# Patient Record
Sex: Male | Born: 1960 | Race: White | Hispanic: No | Marital: Single | Smoking: Former smoker
Health system: Southern US, Community
[De-identification: ages and names within clinical notes are randomized; demographics above are authoritative.]

## PROBLEM LIST (undated history)

## (undated) DIAGNOSIS — F419 Anxiety disorder, unspecified: Secondary | ICD-10-CM

## (undated) DIAGNOSIS — F329 Major depressive disorder, single episode, unspecified: Secondary | ICD-10-CM

## (undated) DIAGNOSIS — D689 Coagulation defect, unspecified: Secondary | ICD-10-CM

## (undated) DIAGNOSIS — I82409 Acute embolism and thrombosis of unspecified deep veins of unspecified lower extremity: Secondary | ICD-10-CM

## (undated) DIAGNOSIS — T7840XA Allergy, unspecified, initial encounter: Secondary | ICD-10-CM

## (undated) DIAGNOSIS — F32A Depression, unspecified: Secondary | ICD-10-CM

## (undated) HISTORY — DX: Anxiety disorder, unspecified: F41.9

## (undated) HISTORY — DX: Depression, unspecified: F32.A

## (undated) HISTORY — DX: Allergy, unspecified, initial encounter: T78.40XA

## (undated) HISTORY — DX: Major depressive disorder, single episode, unspecified: F32.9

## (undated) HISTORY — PX: OTHER SURGICAL HISTORY: SHX169

## (undated) HISTORY — DX: Coagulation defect, unspecified: D68.9

---

## 1998-04-13 ENCOUNTER — Emergency Department (HOSPITAL_COMMUNITY): Admission: EM | Admit: 1998-04-13 | Discharge: 1998-04-13 | Payer: Self-pay | Admitting: Internal Medicine

## 1998-08-20 ENCOUNTER — Emergency Department (HOSPITAL_COMMUNITY): Admission: EM | Admit: 1998-08-20 | Discharge: 1998-08-20 | Payer: Self-pay

## 2000-08-17 ENCOUNTER — Encounter: Payer: Self-pay | Admitting: Emergency Medicine

## 2000-08-17 ENCOUNTER — Encounter: Admission: RE | Admit: 2000-08-17 | Discharge: 2000-08-17 | Payer: Self-pay | Admitting: Emergency Medicine

## 2002-06-26 ENCOUNTER — Encounter: Admission: RE | Admit: 2002-06-26 | Discharge: 2002-06-26 | Payer: Self-pay | Admitting: Internal Medicine

## 2002-06-26 ENCOUNTER — Encounter: Payer: Self-pay | Admitting: Internal Medicine

## 2002-08-31 ENCOUNTER — Emergency Department (HOSPITAL_COMMUNITY): Admission: EM | Admit: 2002-08-31 | Discharge: 2002-09-01 | Payer: Self-pay

## 2003-07-26 ENCOUNTER — Emergency Department (HOSPITAL_COMMUNITY): Admission: EM | Admit: 2003-07-26 | Discharge: 2003-07-26 | Payer: Self-pay | Admitting: Emergency Medicine

## 2003-07-26 IMAGING — CR Imaging study
2 series · 2 of 2 positions shown · non-contrast
Comparison: none

CLINICAL DATA: 43 year old male, chest pain and history of smoking.
 TWO VIEW CHEST RADIOGRAPH

[view not recorded (1 of 2)]
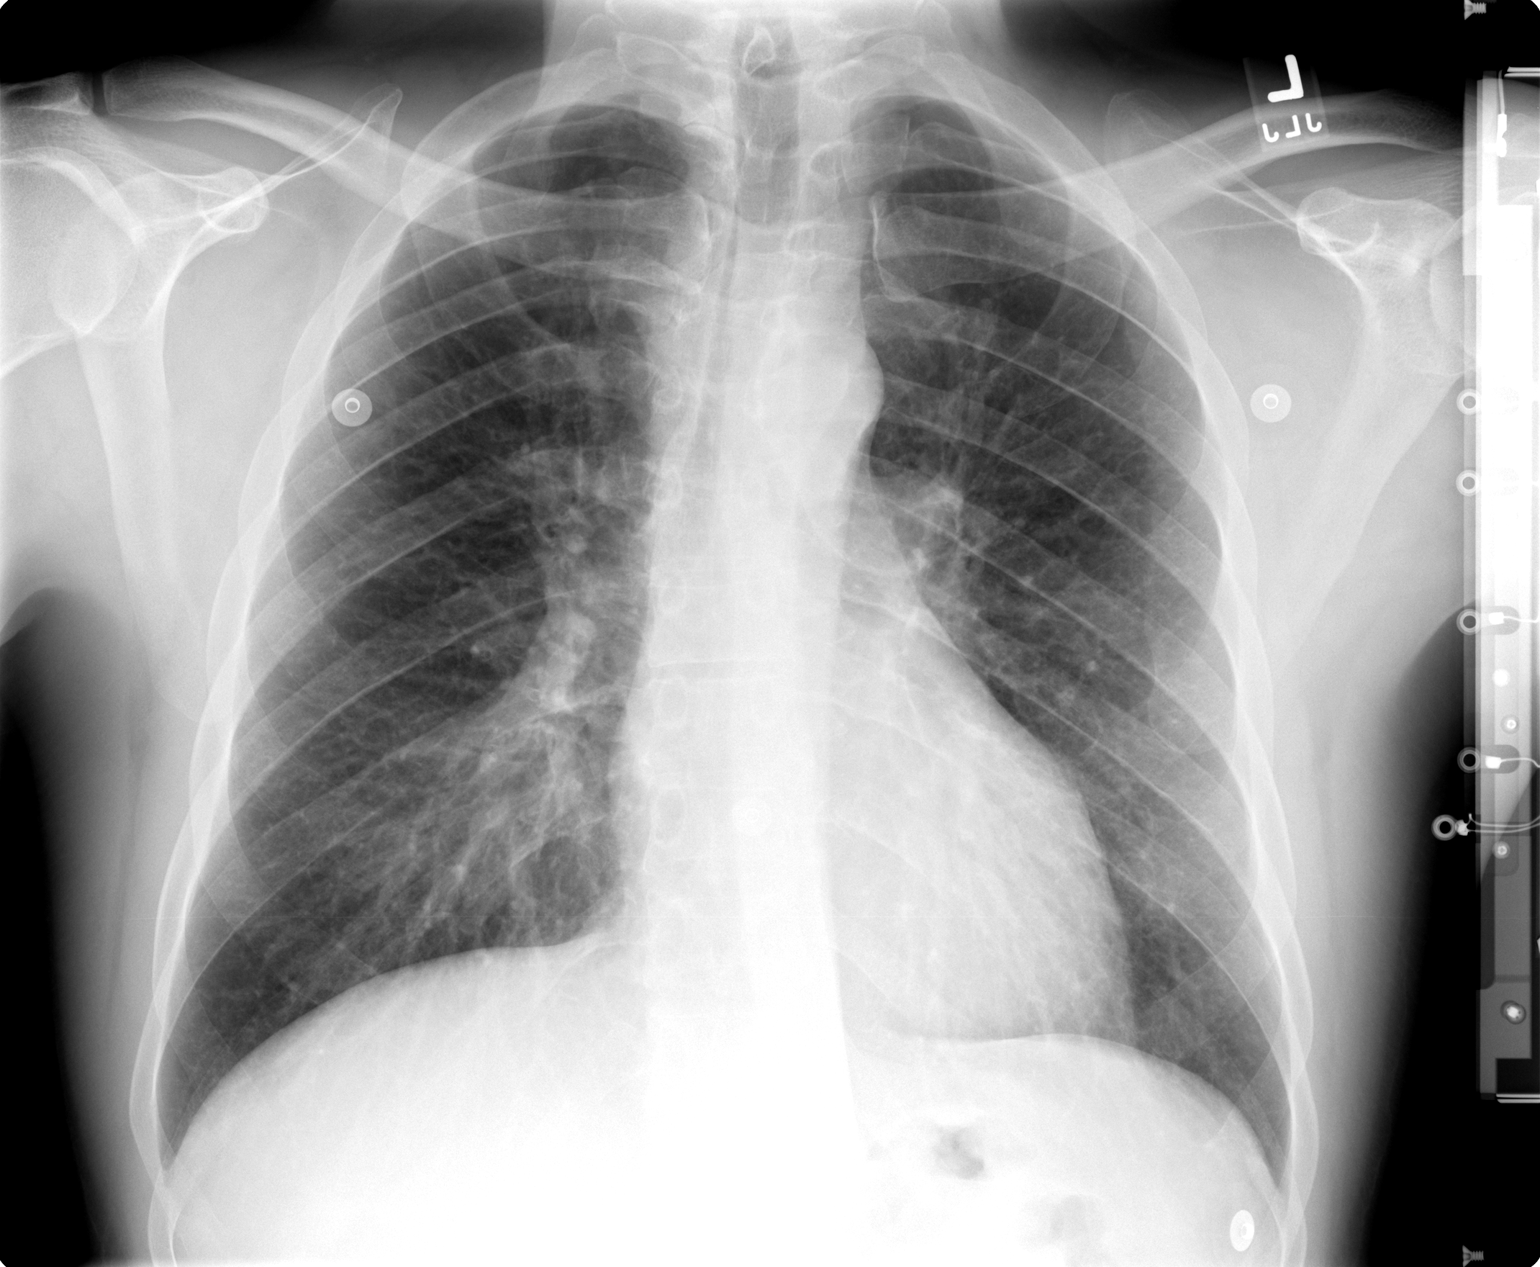

[view not recorded (2 of 2)]
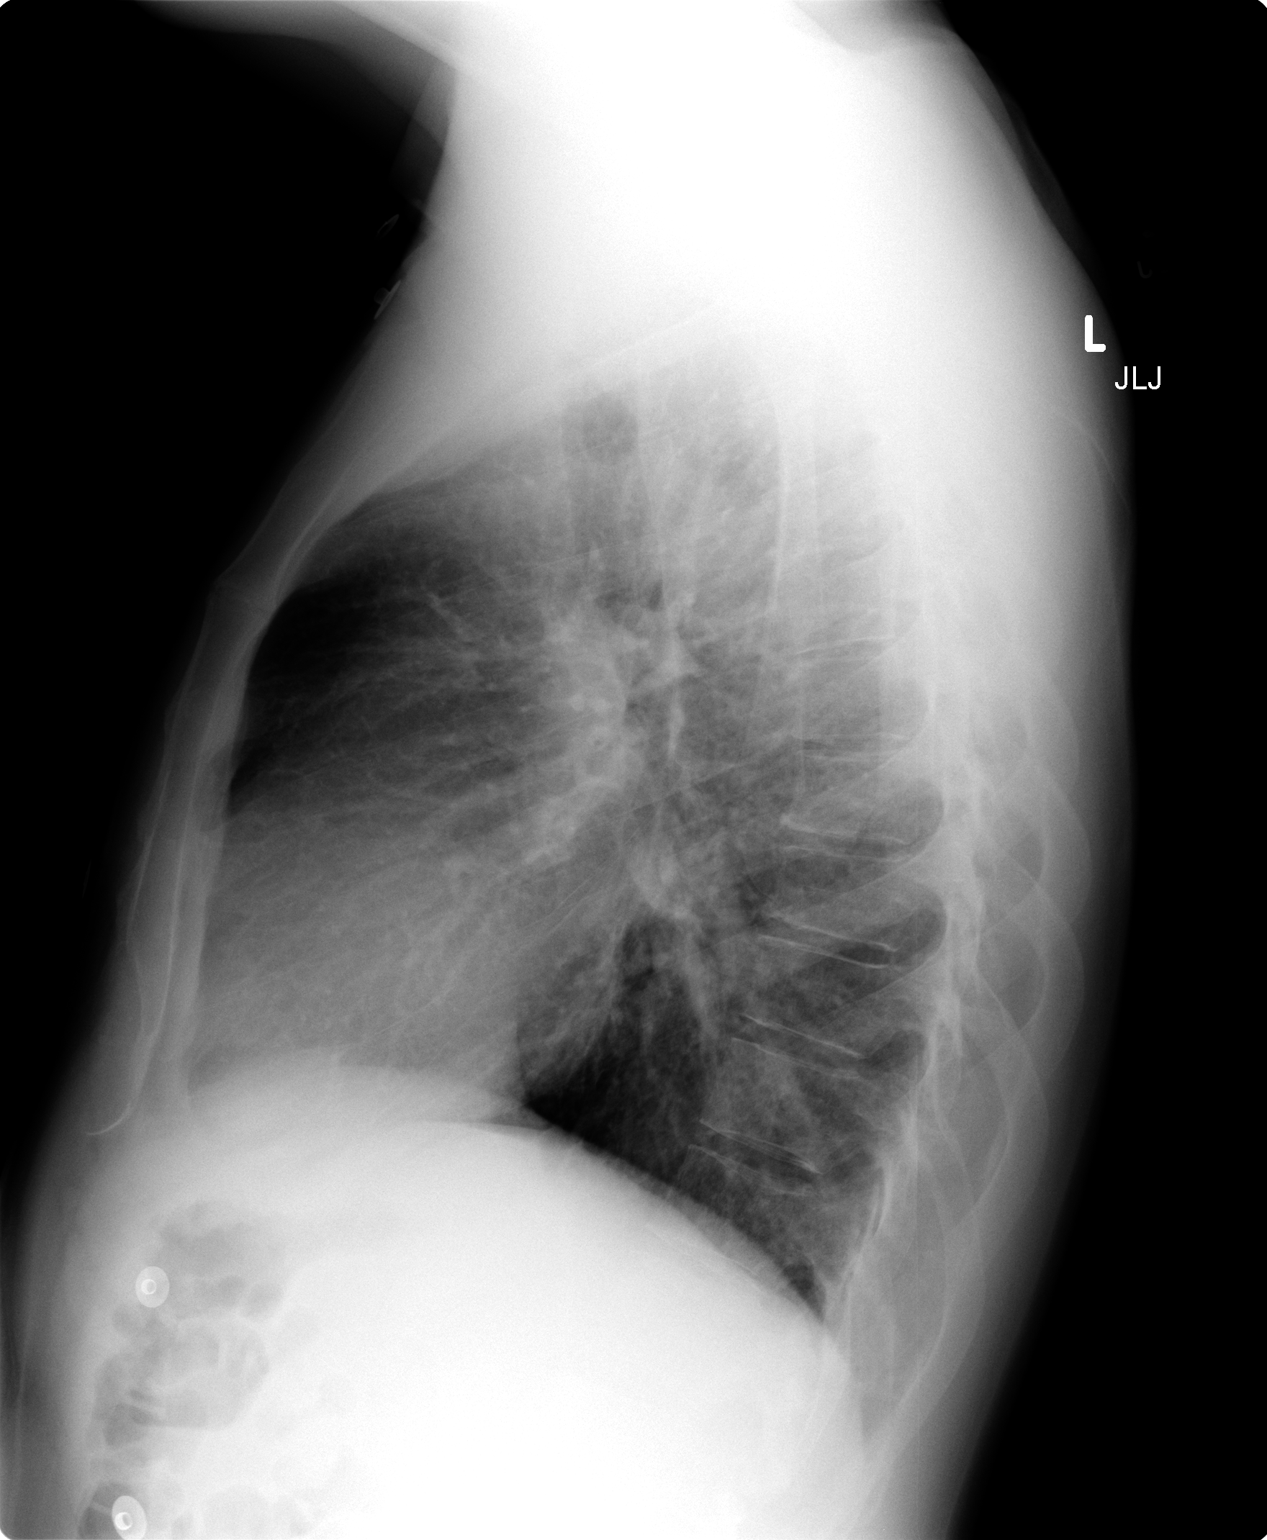

[2 of 2 positions shown; findings below may reference images not displayed]

FINDINGS: Bilateral central peribronchial thickening is noted extending into the lower lobes.  This can be seen with bronchitis or bronchopneumonia.  No effusion, edema, or pneumothorax.  Incidental bilateral cervical ribs are noted.  
 IMPRESSION
 Bilateral hilar central peribronchial thickening concerning for bronchitis versus bronchopneumonia.

## 2003-09-26 ENCOUNTER — Encounter: Admission: RE | Admit: 2003-09-26 | Discharge: 2003-09-26 | Payer: Self-pay | Admitting: Internal Medicine

## 2003-09-26 IMAGING — CR Imaging study
3 series · 3 of 3 positions shown · non-contrast
Comparison: none

CLINICAL DATA: Right shoulder pain.  Smoker.
 TWO VIEW CHEST
 Comparison [DATE].  The lungs are clear.  The heart and mediastinal structures are normal.  Mild chronic bronchitic change is noted. 
 IMPRESSION
 Mild chronic bronchitic changes.  No evidence for active chest disease. 
 VIEWS OF THE RIGHT SHOULDER ? COMPLETE
 There is no evidence of fracture or dislocation. No other significant bone or soft tissue abnormalities are identified.

 Normal study.

[view not recorded (1 of 3)]
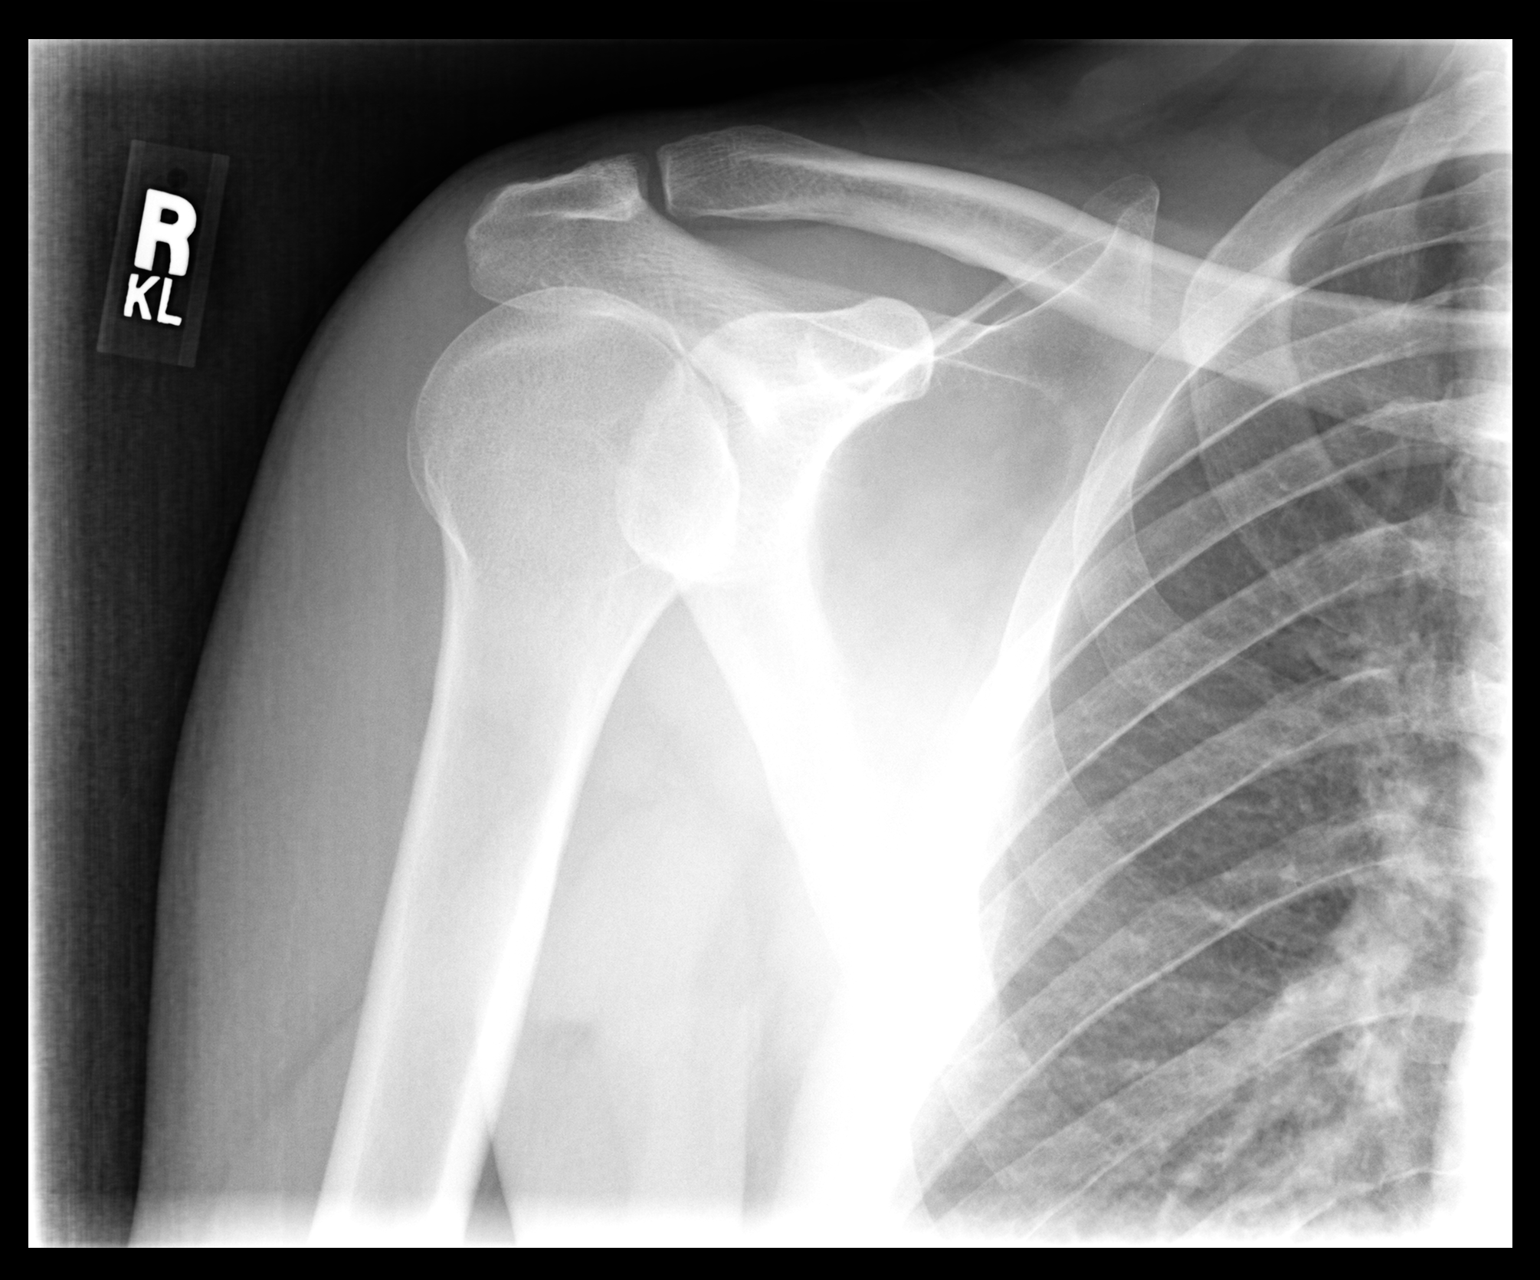

[view not recorded (2 of 3)]
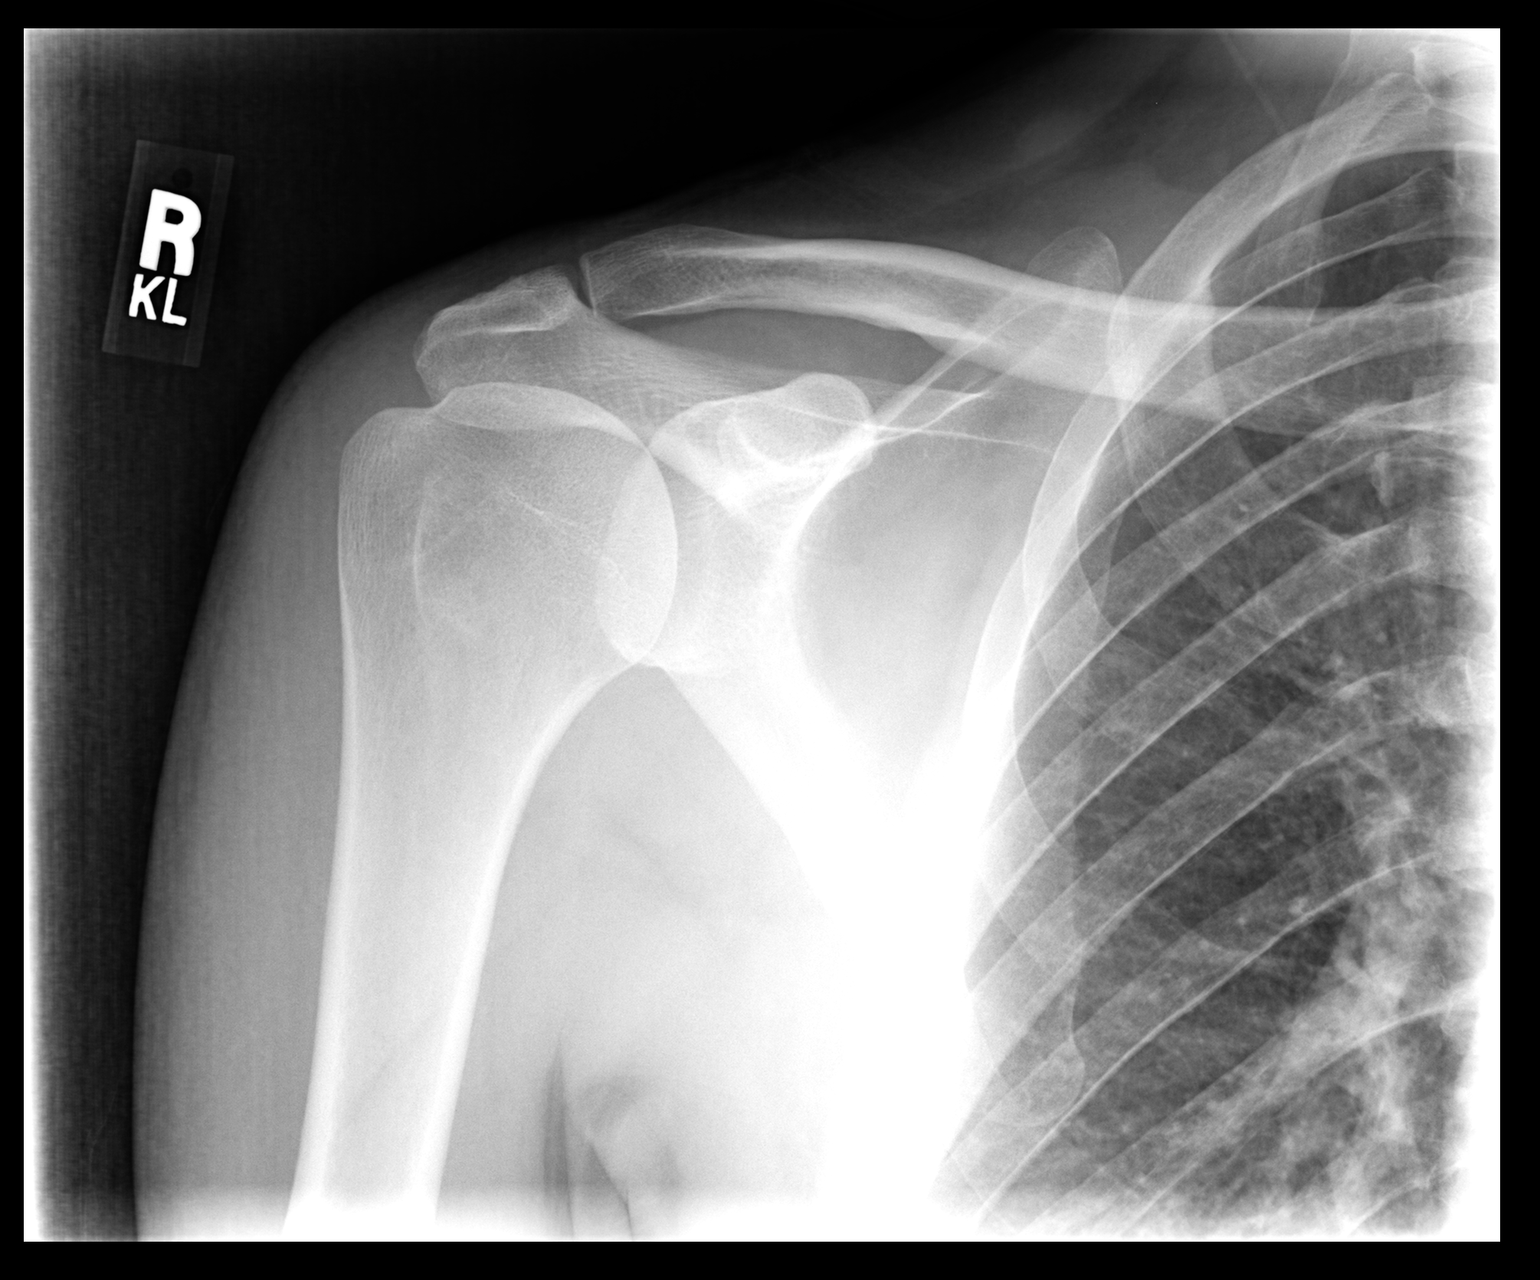

[view not recorded (3 of 3)]
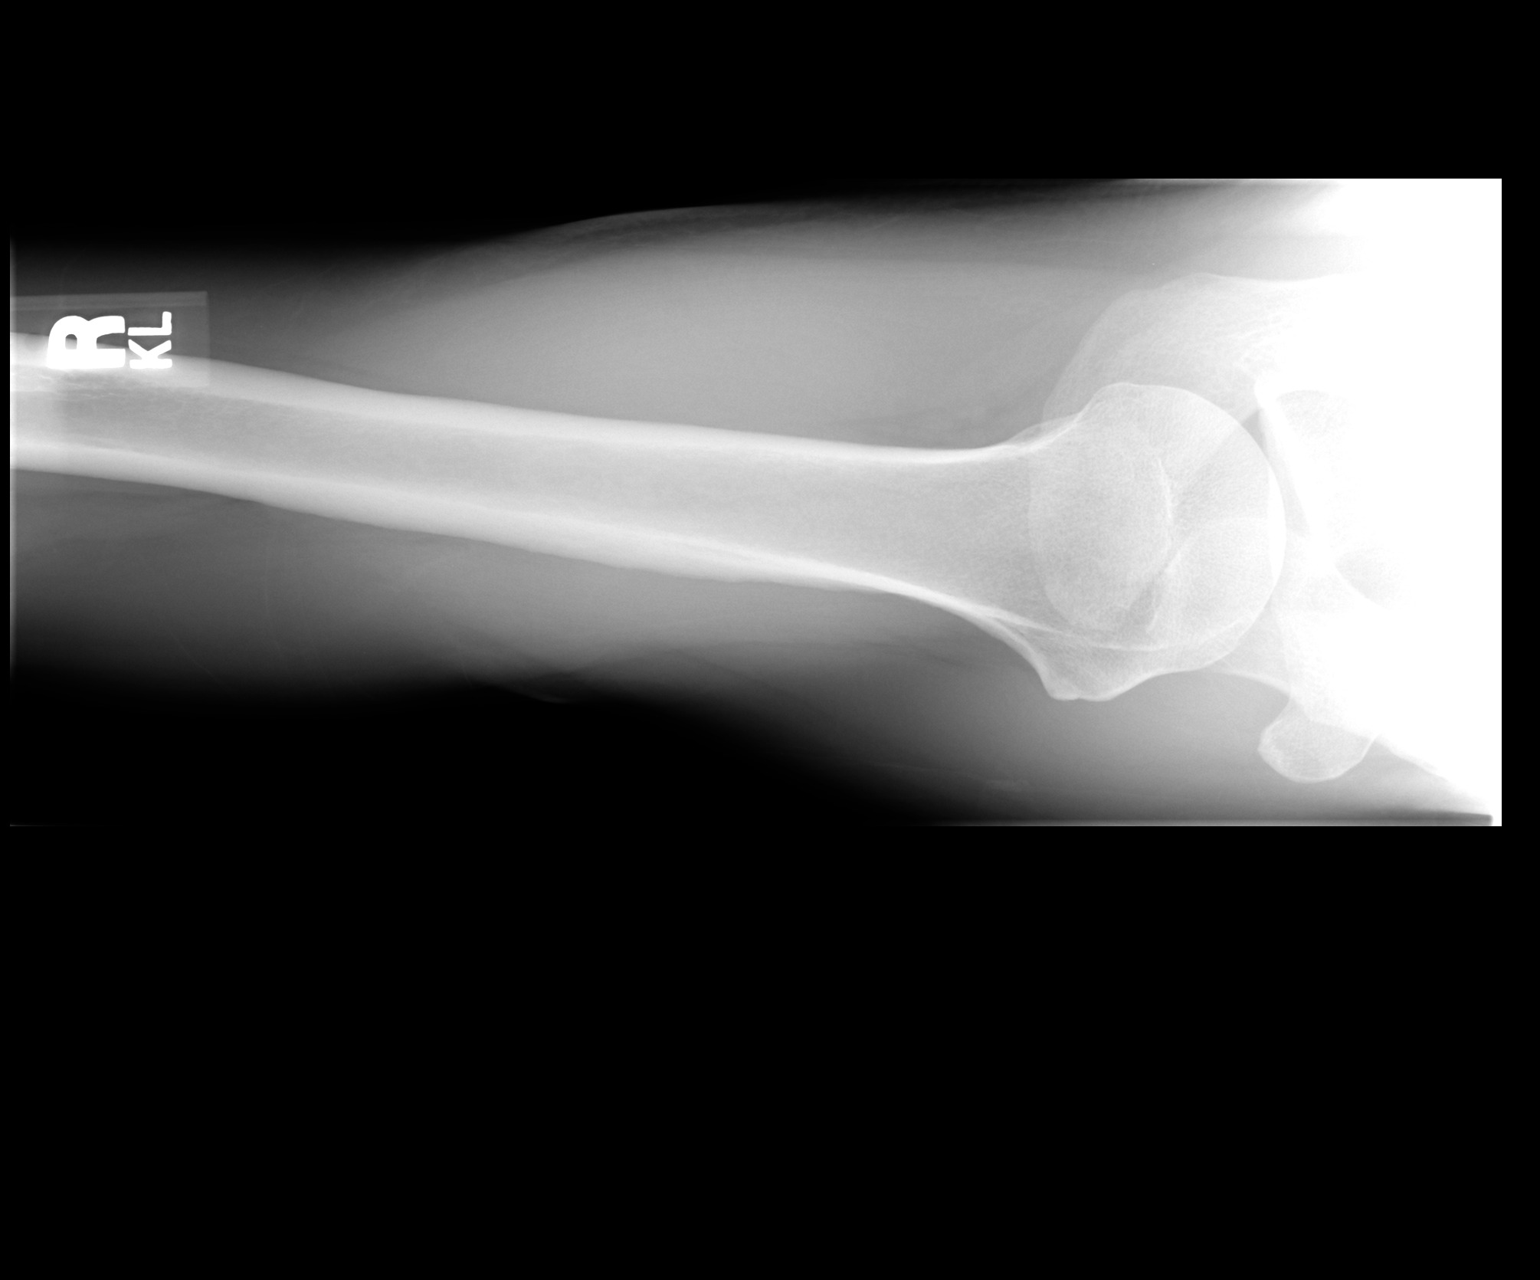

[3 of 3 positions shown; findings below may reference images not displayed]

## 2003-09-26 IMAGING — CR Imaging study
3 series · 3 of 3 positions shown · non-contrast
Comparison: none

CLINICAL DATA: Right shoulder pain.  Smoker.
 TWO VIEW CHEST
 Comparison [DATE].  The lungs are clear.  The heart and mediastinal structures are normal.  Mild chronic bronchitic change is noted. 
 IMPRESSION
 Mild chronic bronchitic changes.  No evidence for active chest disease. 
 VIEWS OF THE RIGHT SHOULDER ? COMPLETE
 There is no evidence of fracture or dislocation. No other significant bone or soft tissue abnormalities are identified.

 Normal study.

[view not recorded (1 of 3)]
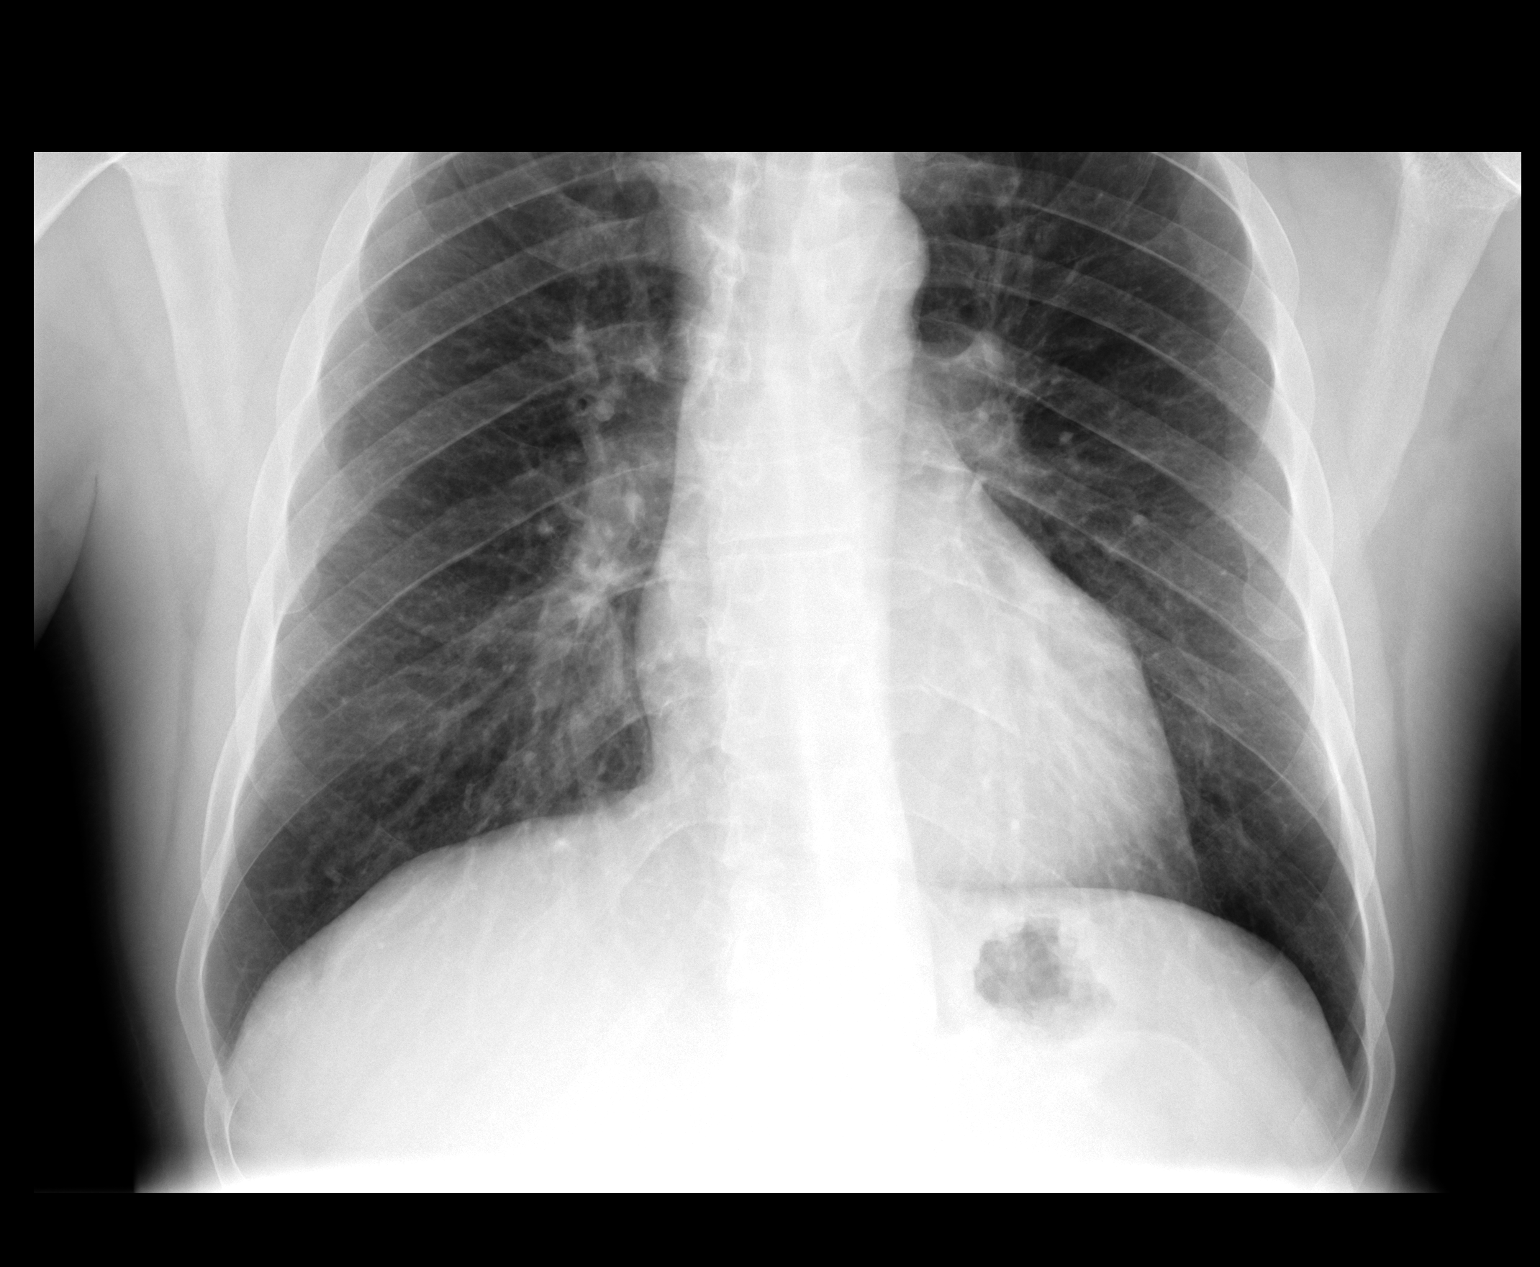

[view not recorded (2 of 3)]
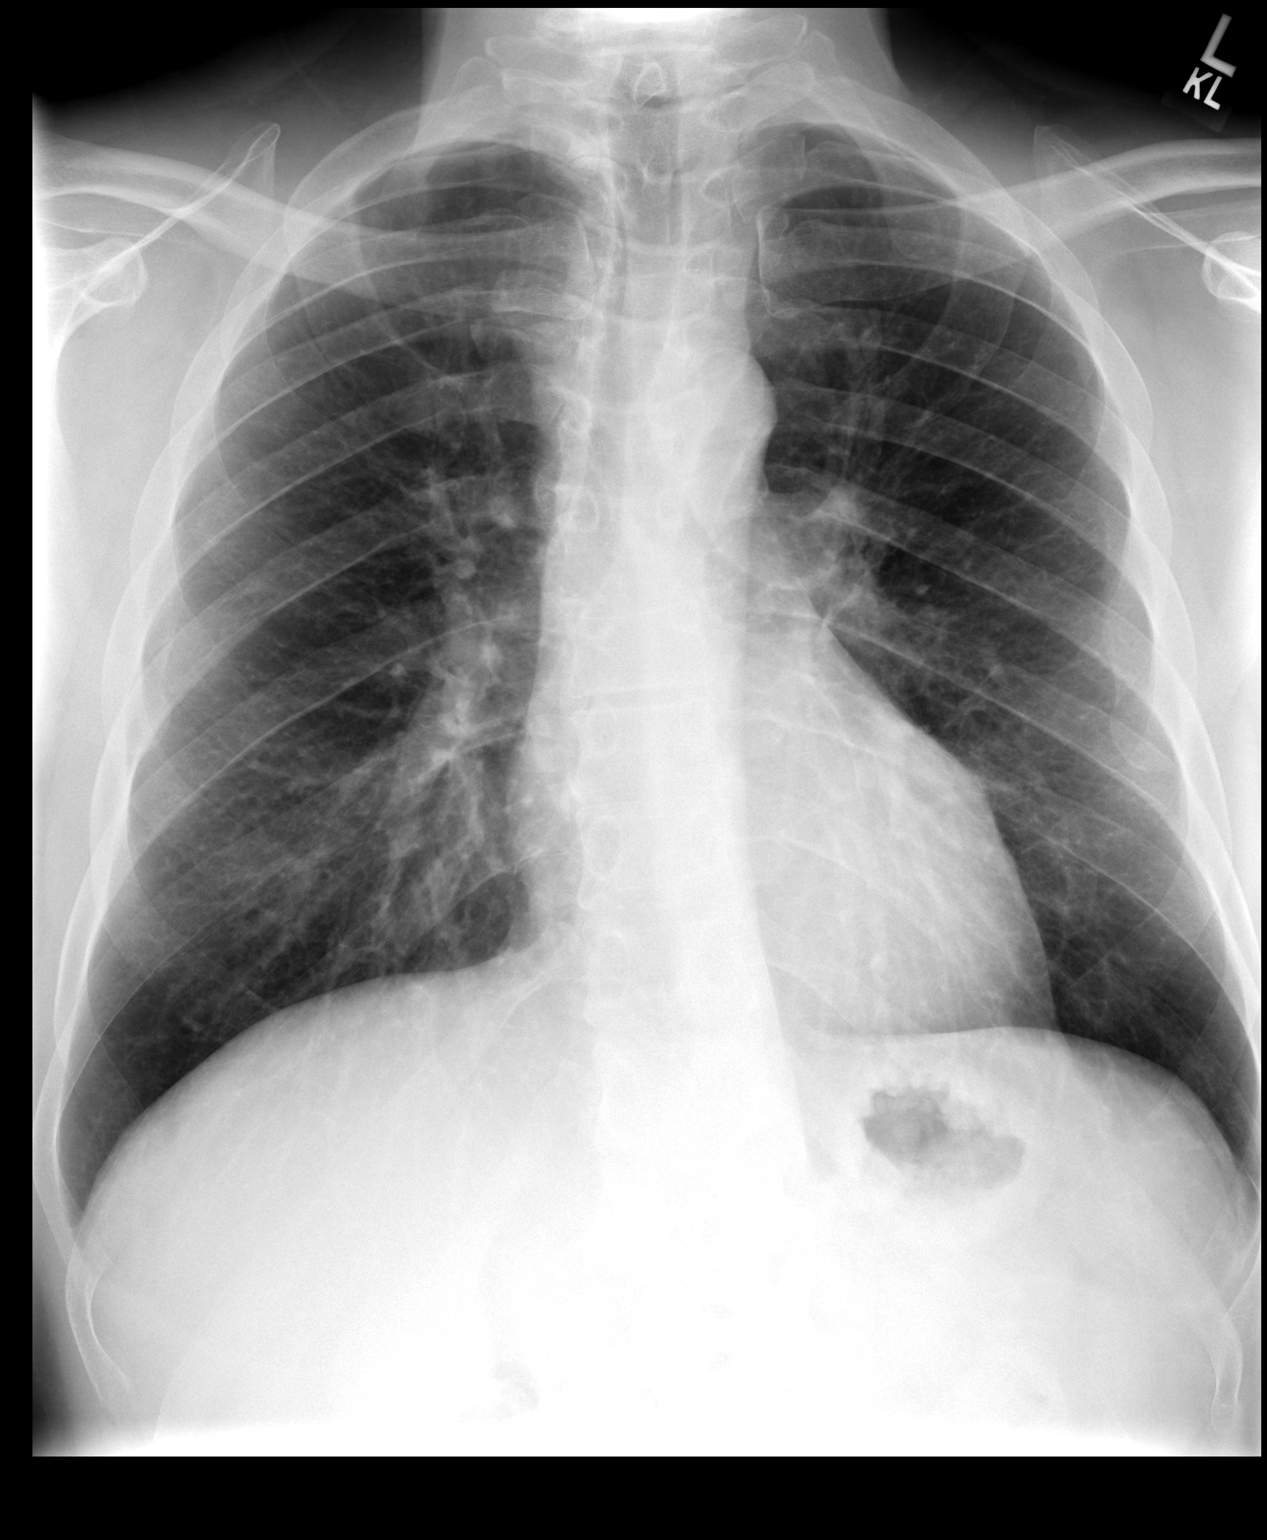

[view not recorded (3 of 3)]
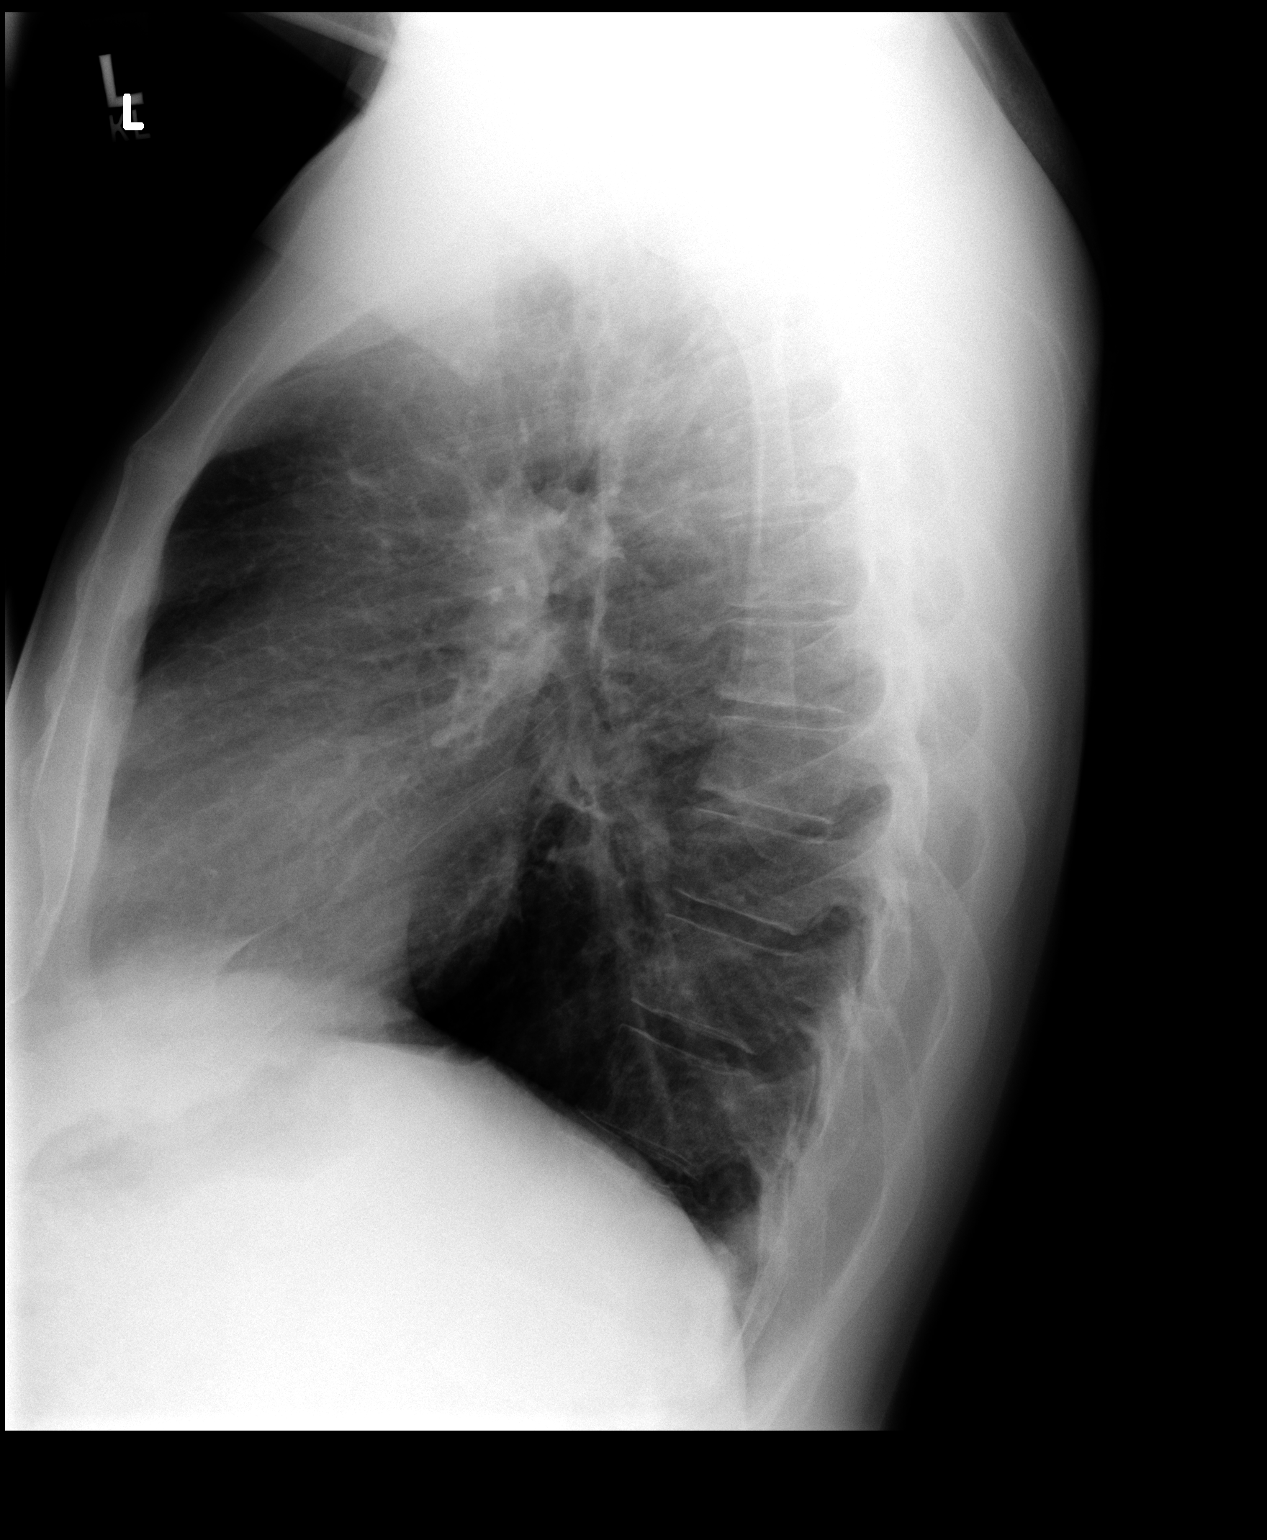

[3 of 3 positions shown; findings below may reference images not displayed]

## 2003-09-28 ENCOUNTER — Encounter: Admission: RE | Admit: 2003-09-28 | Discharge: 2003-09-28 | Payer: Self-pay | Admitting: Internal Medicine

## 2003-09-28 IMAGING — US UNKNOWN
1 series · 14 of 16 positions shown · non-contrast
Comparison: none.

CLINICAL DATA: Right upper quadrant pain and tenderness, possible hepatomegaly.  
COMPLETE ABDOMINAL ULTRASOUND

[Series 1: unknown · 0.32mm/px · 14 of 75 slices shown]
[im 1/75]
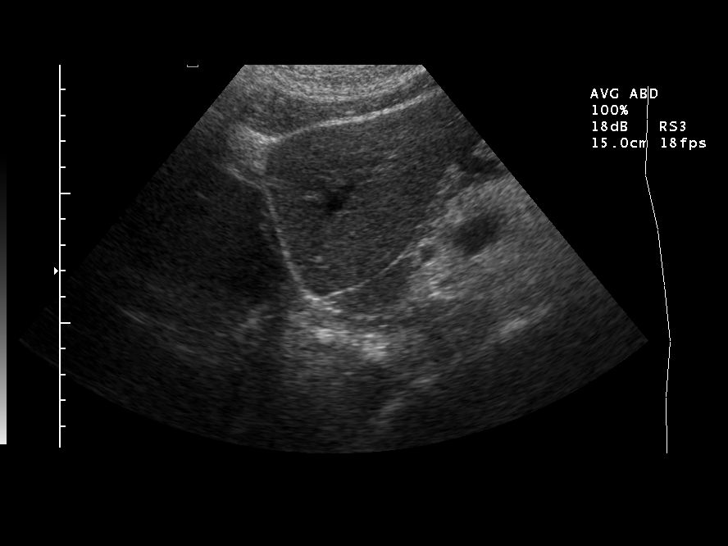
[im 5/75]
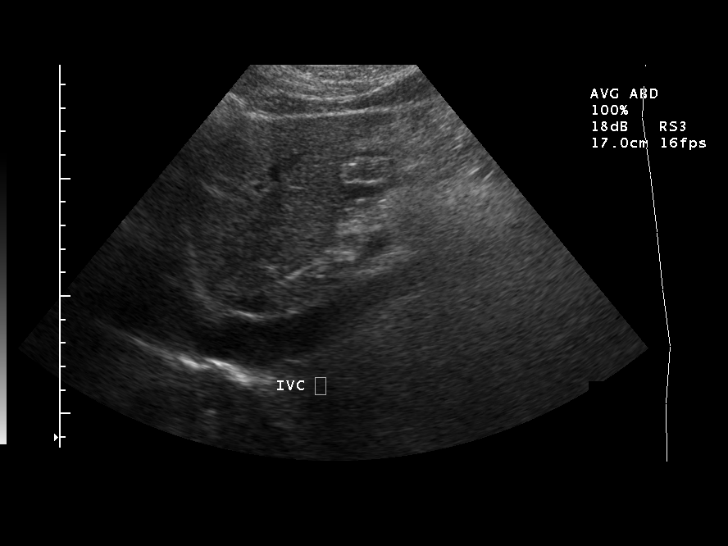
[im 10/75]
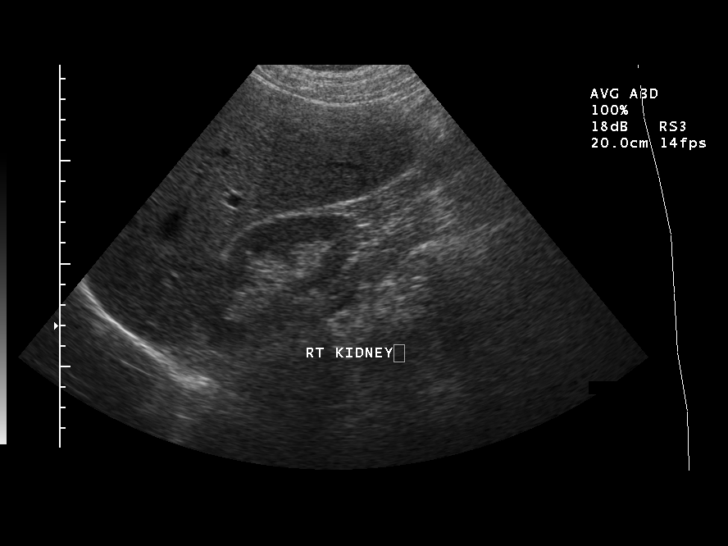
[im 20/75]
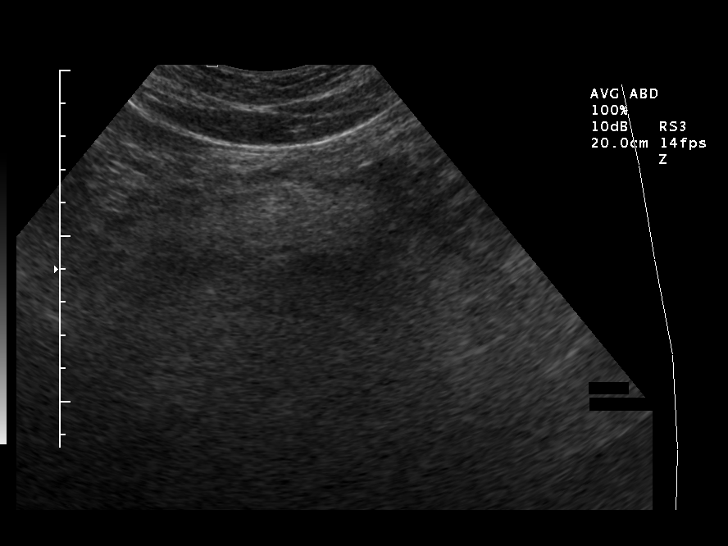
[im 25/75]
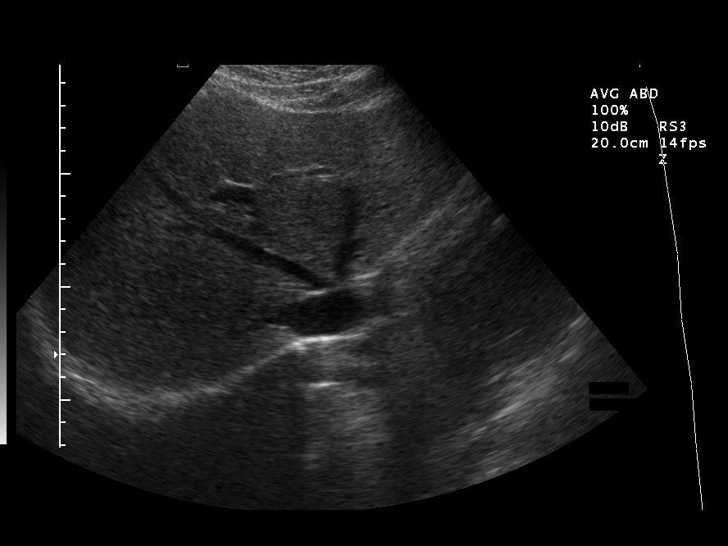
[im 30/75]
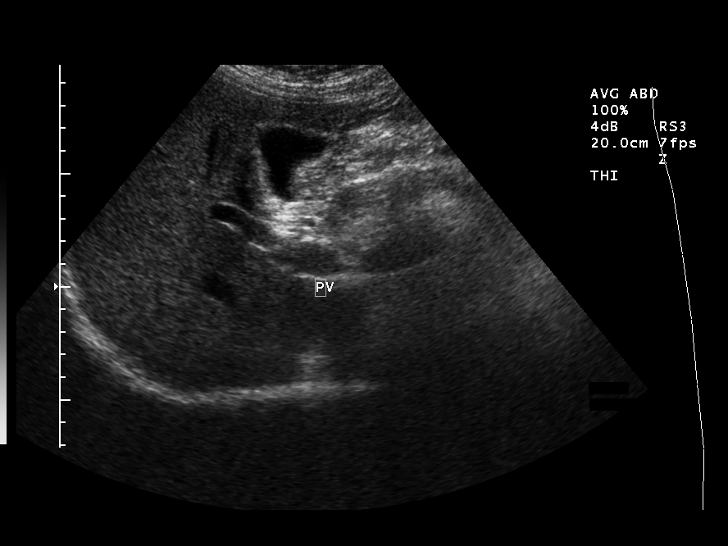
[im 35/75]
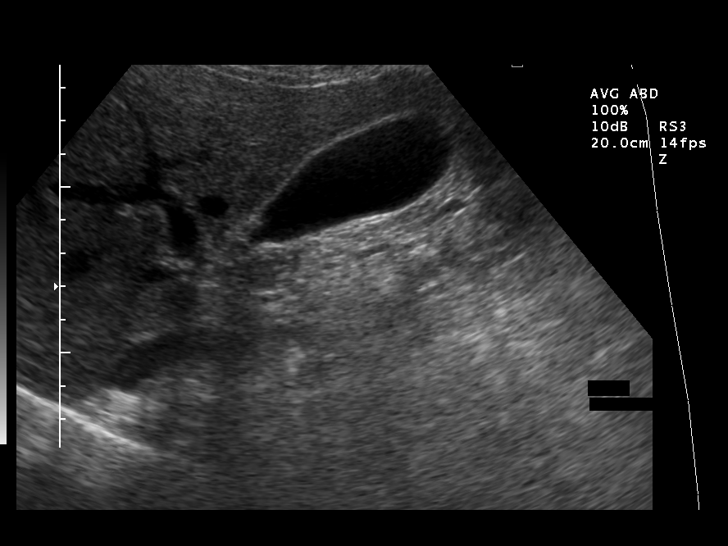
[im 40/75]
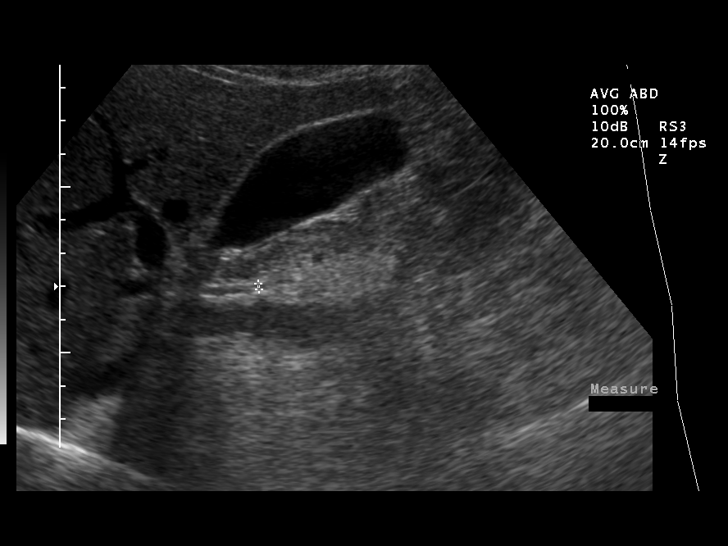
[im 45/75]
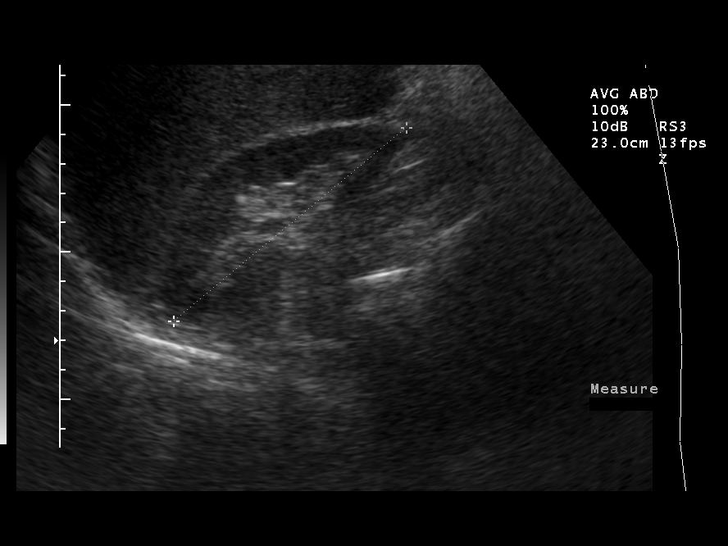
[im 50/75]
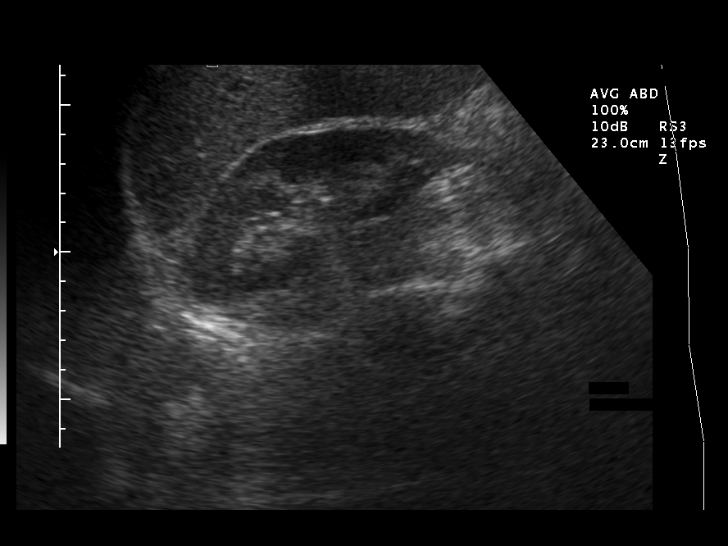
[im 60/75]
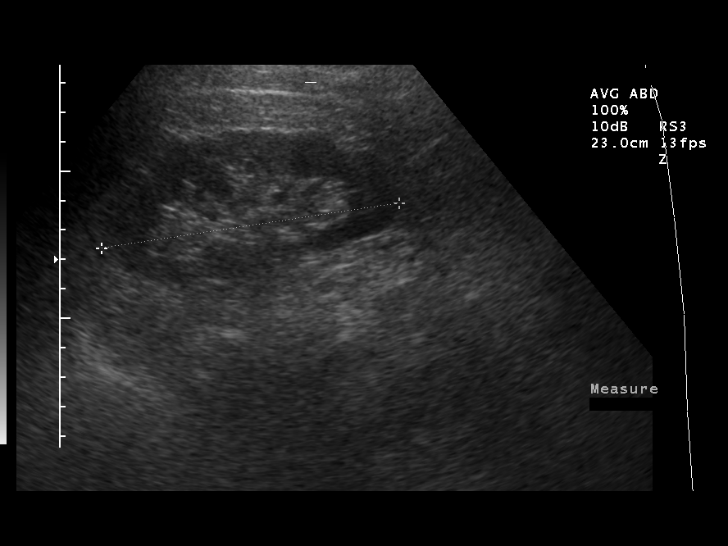
[im 65/75]
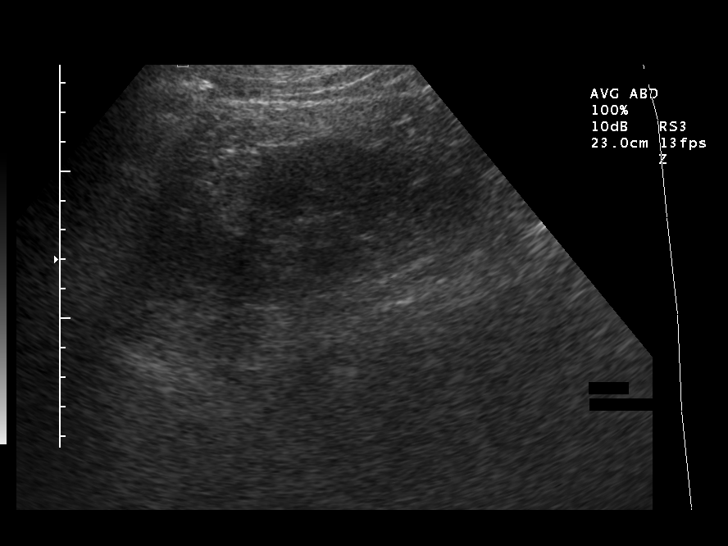
[im 70/75]
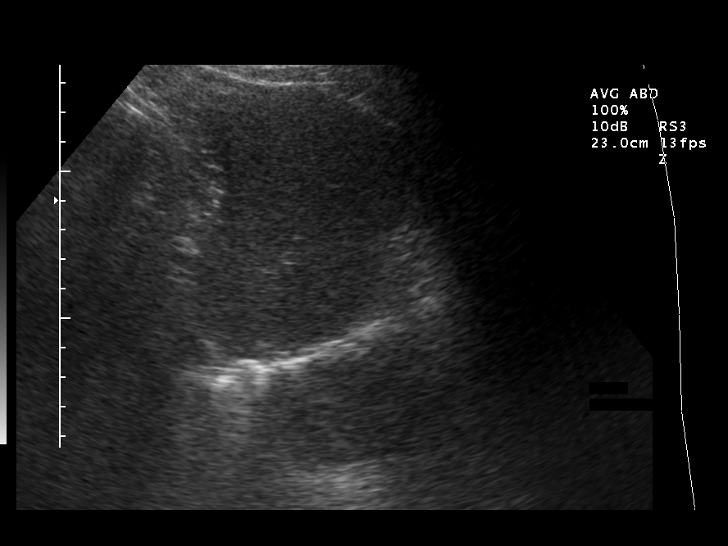
[im 75/75]
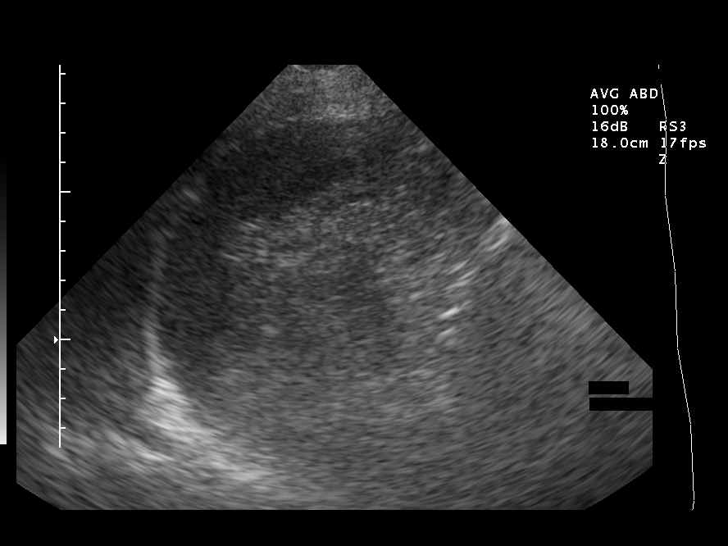

[14 of 16 positions shown; findings below may reference images not displayed]

FINDINGS: Gallbladder:  Normal. 

Common bile duct:  Normal, 3 mm.

Liver:  Normal.  No evidence of hepatomegaly.  

Inferior vena cava:  Patent.  

Pancreas:  Normal head and body; the tail obscured by overlying bowel gas. 

Spleen:  Normal, 11.0 cm.

Right kidney:  Normal, 10.2 cm.  

Left kidney:  Normal, 10.2 cm. 

Abdominal aorta:  Normal, maximum diameter 1.9 cm.

IMPRESSION

Normal abdominal ultrasound with a caveat that the pancreatic tail was obscured by overlying bowel gas and was, therefore,
not evaluated.

## 2005-01-14 ENCOUNTER — Emergency Department (HOSPITAL_COMMUNITY): Admission: EM | Admit: 2005-01-14 | Discharge: 2005-01-14 | Payer: Self-pay | Admitting: Emergency Medicine

## 2006-01-10 ENCOUNTER — Emergency Department (HOSPITAL_COMMUNITY): Admission: EM | Admit: 2006-01-10 | Discharge: 2006-01-10 | Payer: Self-pay | Admitting: Emergency Medicine

## 2006-09-30 ENCOUNTER — Ambulatory Visit (HOSPITAL_COMMUNITY): Admission: RE | Admit: 2006-09-30 | Discharge: 2006-09-30 | Payer: Self-pay | Admitting: Family Medicine

## 2006-09-30 ENCOUNTER — Ambulatory Visit: Payer: Self-pay | Admitting: Vascular Surgery

## 2008-12-25 ENCOUNTER — Encounter (HOSPITAL_BASED_OUTPATIENT_CLINIC_OR_DEPARTMENT_OTHER): Admission: RE | Admit: 2008-12-25 | Discharge: 2009-03-12 | Payer: Self-pay | Admitting: Internal Medicine

## 2009-01-21 ENCOUNTER — Ambulatory Visit: Payer: Self-pay | Admitting: Surgery

## 2010-02-23 ENCOUNTER — Encounter: Payer: Self-pay | Admitting: Internal Medicine

## 2010-06-17 NOTE — Assessment & Plan Note (Signed)
OFFICE VISIT   TAMI, BARREN  DOB:  [birth year]                                       01/21/2009  [id number]#:[id number]   REASON FOR VISIT:  Ulcer on the left foot.   CHIEF COMPLAINT:  Painful ulceration, left foot.   HISTORY:  This is a 50 year old gentleman who developed a punctate  lesion on his left foot.  This has been present for many months.  He has  been seen at the wound center but has ultimately left them.  He was  scheduled to have a vascular lab ultrasound.  This was cancelled on  several occasions.  He comes back in today for a study.  He has been  recently seen by a dermatologist.  He has now in an Radio broadcast assistant.   Patient does not recall any trauma associated with this wound.  He  states that it may been due to a spider bite, although he cannot confirm  this.  The wound continues to be very tender.  The pain medicine make  him dizzy, so this does not help.  He has continued to work.   PAST MEDICAL HISTORY:  Pancreatitis secondary to codeine.   FAMILY HISTORY:  Positive for kidney disease.   SOCIAL HISTORY:  Single.  Currently smokes a pack a day.  Drinks  occasional beer.   REVIEW OF SYSTEMS:  Positive for weight loss and loss of appetite.  Pain  in the legs with walking, headaches, joint pain, muscle pain,  depression, attention deficit disorder, anxiety, clotting problems.   MEDICATIONS:  Include Coumadin.   ALLERGIES:  Penicillin, codeine and sulfa.   PHYSICAL EXAMINATION:  Heart rate 71, blood pressure 156/88,  respirations 16.  General:  He is well-appearing in no distress.  He is  normocephalic, atraumatic.  Pupils equal.  Sclerae anicteric.  Respirations are clear bilaterally.  Cardiovascular:  Regular rate and  rhythm.  Abdomen:  Soft.  Musculoskeletal reveals a left medial  malleolus ulcer.  He has palpable pulses.  Psych:  He has a normal  affect.   DIAGNOSTIC STUDIES:  Duplex ultrasound was performed today, which  reveals  normal ankle brachial indices bilaterally with triphasic wave  forms.   ASSESSMENT/PLAN:  Left leg ulcer.   PLAN:  I have reassured the patient that he does not have evidence of  arterial insufficiency.  I think he should continue with his current  plan of wound care.  I will see him back on a p.r.n. basis.  We also  discussed wearing compression stockings to help with his venous  insufficiency, right now on the right leg, and on the left leg when his  ulcer heals.     Jorge Ny, MD  Electronically Signed   VWB/MEDQ  D:  01/21/2009  T:  01/22/2009  Job:  [id number]   cc:   Joanne Gavel, M.D.

## 2013-04-07 ENCOUNTER — Emergency Department (INDEPENDENT_AMBULATORY_CARE_PROVIDER_SITE_OTHER)
Admission: EM | Admit: 2013-04-07 | Discharge: 2013-04-07 | Disposition: A | Discharge status: Home / Self Care | Payer: Self-pay | Source: Home / Self Care | Attending: Family Medicine | Admitting: Family Medicine

## 2013-04-07 ENCOUNTER — Emergency Department (INDEPENDENT_AMBULATORY_CARE_PROVIDER_SITE_OTHER): Payer: Self-pay

## 2013-04-07 ENCOUNTER — Encounter (HOSPITAL_COMMUNITY): Payer: Self-pay | Admitting: Emergency Medicine

## 2013-04-07 ENCOUNTER — Other Ambulatory Visit (HOSPITAL_COMMUNITY)
Admission: RE | Admit: 2013-04-07 | Discharge: 2013-04-07 | Disposition: A | Discharge status: Home / Self Care | Payer: Self-pay | Source: Ambulatory Visit | Attending: Family Medicine | Admitting: Family Medicine

## 2013-04-07 DIAGNOSIS — M545 Low back pain, unspecified: Secondary | ICD-10-CM

## 2013-04-07 DIAGNOSIS — Z113 Encounter for screening for infections with a predominantly sexual mode of transmission: Secondary | ICD-10-CM | POA: Insufficient documentation

## 2013-04-07 LAB — POCT URINALYSIS DIP (DEVICE)
Bilirubin Urine: NEGATIVE
Glucose, UA: NEGATIVE mg/dL
Hgb urine dipstick: NEGATIVE
Ketones, ur: NEGATIVE mg/dL
Leukocytes, UA: NEGATIVE
Nitrite: NEGATIVE
Protein, ur: NEGATIVE mg/dL
Specific Gravity, Urine: >=1.03 (ref 1.005–1.030)
Urobilinogen, UA: 0.2 mg/dL (ref 0.0–1.0)
pH: 5.5 (ref 5.0–8.0)

## 2013-04-07 IMAGING — CR DG LUMBAR SPINE COMPLETE 4+V
5 series · 5 of 5 positions shown · non-contrast
Comparison: None.

CLINICAL DATA: Low back pain without injury

EXAM:
LUMBAR SPINE - COMPLETE 4+ VIEW

[view not recorded (1 of 5)]
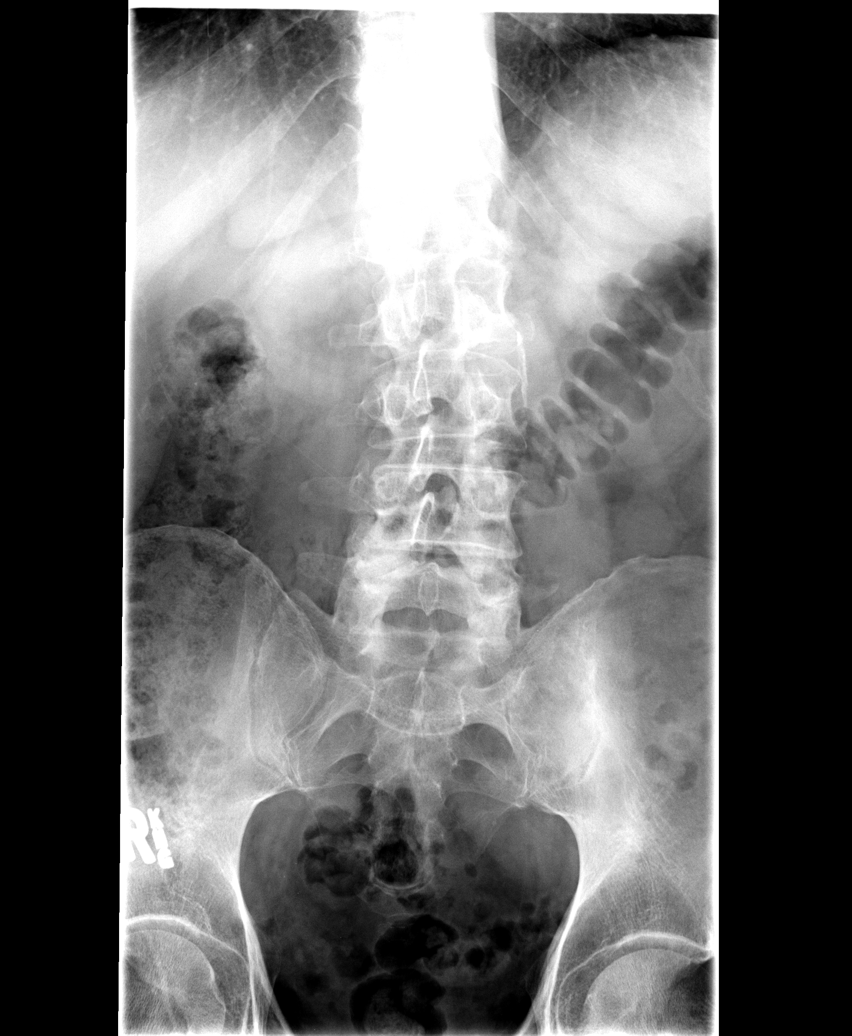

[view not recorded (2 of 5)]
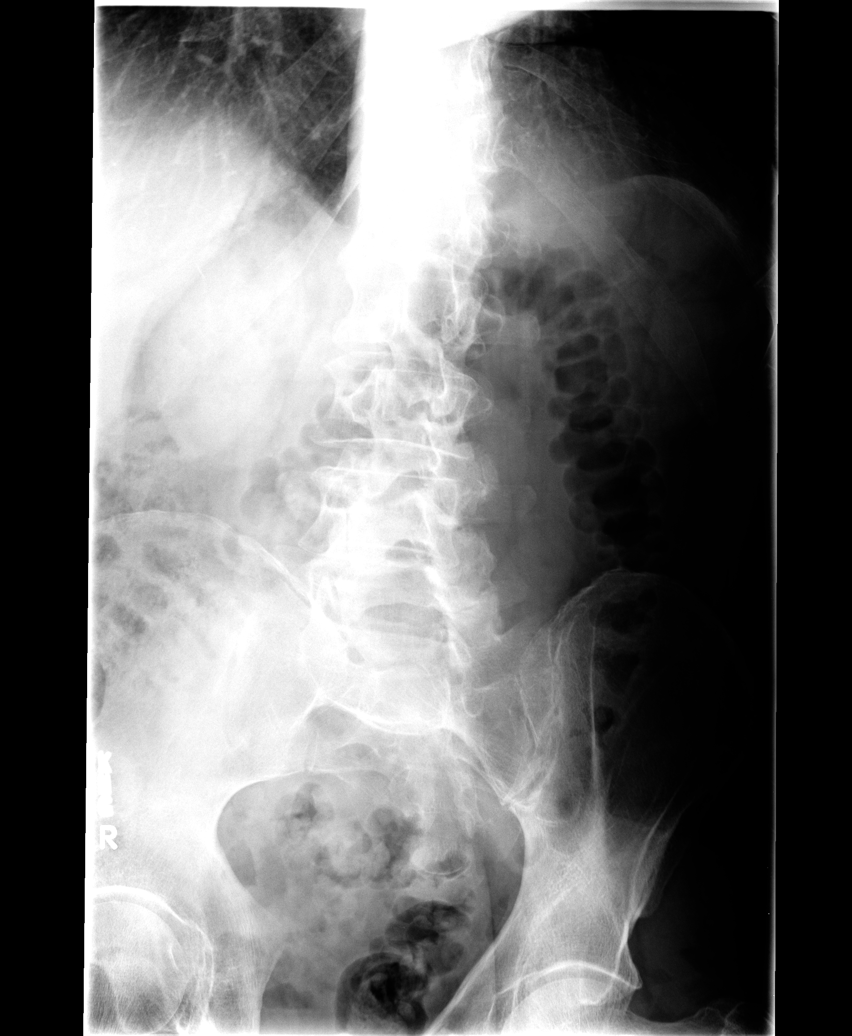

[view not recorded (3 of 5)]
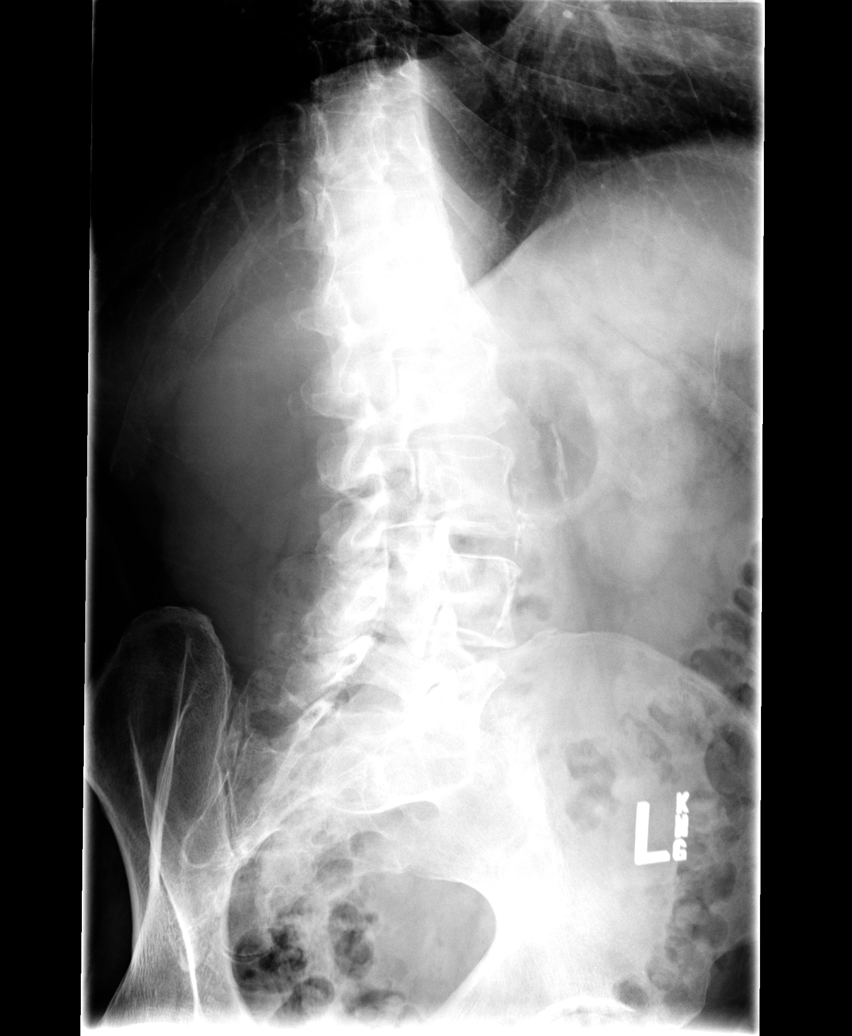

[view not recorded (4 of 5)]
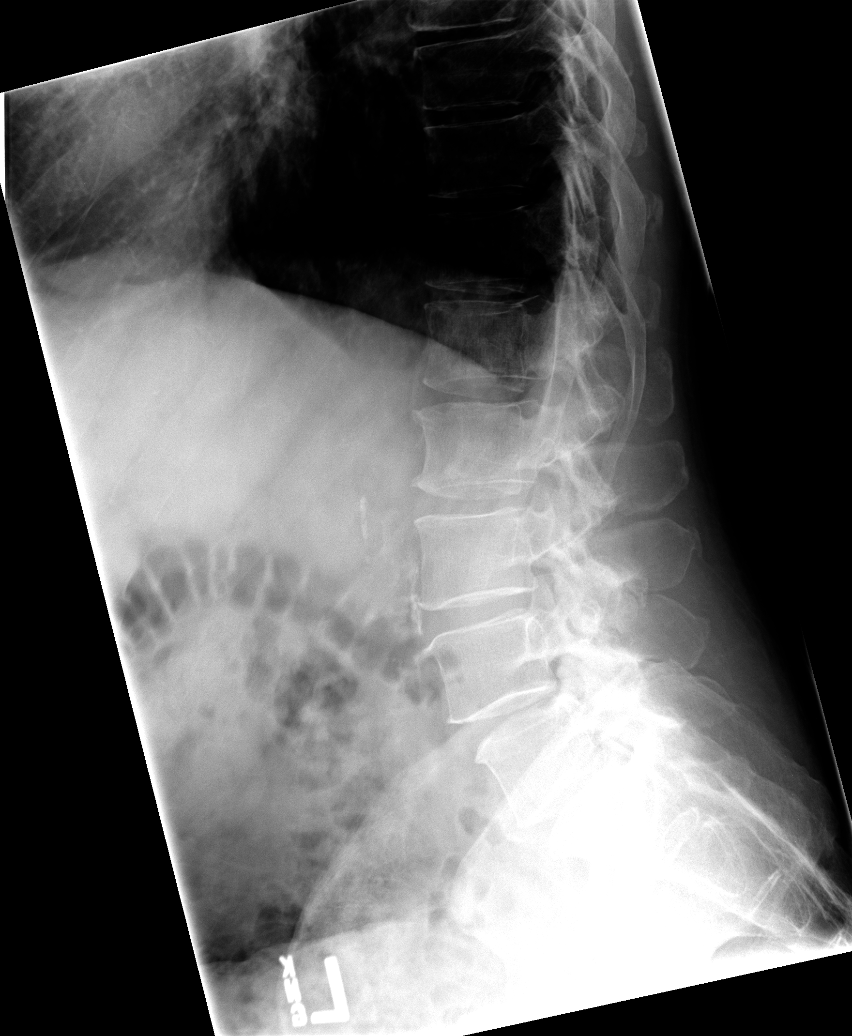

[view not recorded (5 of 5)]
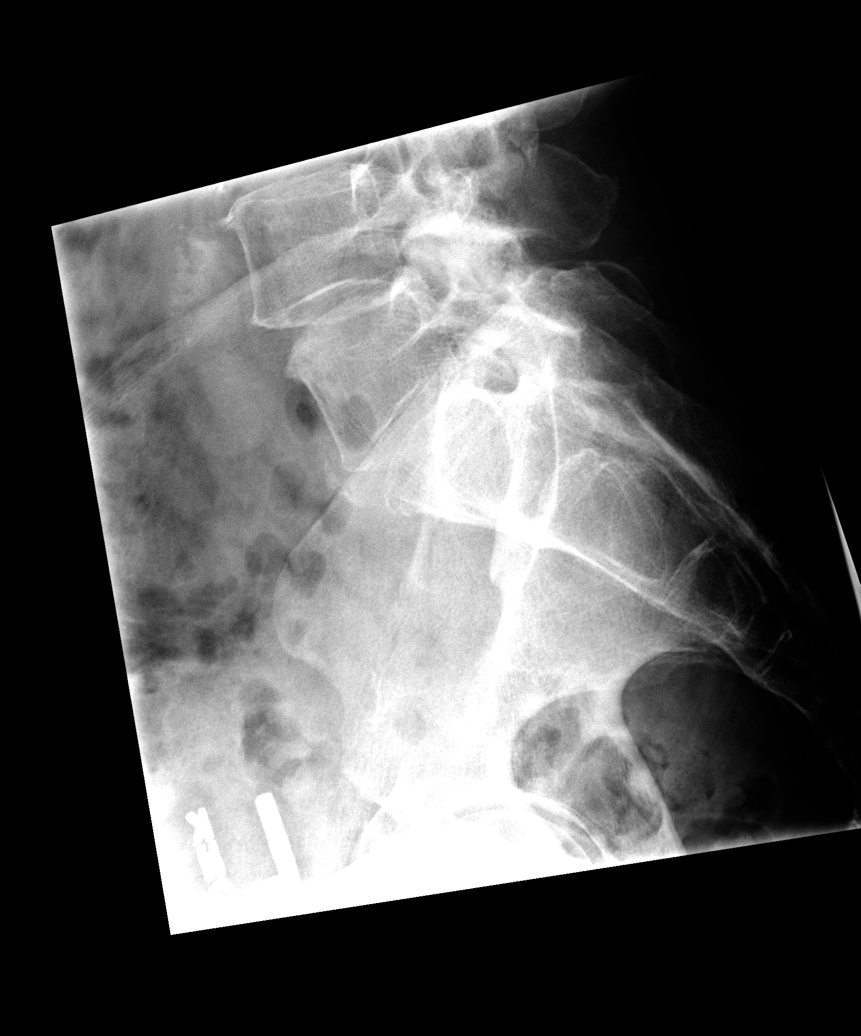

[5 of 5 positions shown; findings below may reference images not displayed]

FINDINGS: Five lumbar type vertebral bodies are well visualized. A mild
scoliosis concave to the right is seen. No spondylolysis is noted.
Very mild degenerative spondylolisthesis is noted of L4 on L5. Mild
osteophytes are noted as well. Mild aortic calcifications are noted
without aneurysmal dilatation.
IMPRESSION: Mild degenerative change with anterolisthesis as described.

## 2013-04-07 MED ORDER — PREDNISONE 10 MG PO KIT: PACK | ORAL | Status: DC

## 2013-04-07 NOTE — ED Notes (Signed)
C/o  constant left flank pain with urinary frequency.   Symptoms present x several months.  Pain radiates from left to right flank.   Also having trebling/weakness in hands.

## 2013-04-07 NOTE — Discharge Instructions (Signed)
Thank you for coming in today. Take prednisone for 12 days.  Come back or go to the emergency room if you notice new weakness new numbness problems walking or bowel or bladder problems. If your tremor has not improved please come back in 3 months.    Back Exercises Back exercises help treat and prevent back injuries. The goal is to increase your strength in your belly (abdominal) and back muscles. These exercises can also help with flexibility. Start these exercises when told by your doctor. HOME CARE Back exercises include: Pelvic Tilt.  Lie on your back with your knees bent. Tilt your pelvis until the lower part of your back is against the floor. Hold this position 5 to 10 sec. Repeat this exercise 5 to 10 times. Knee to Chest.  Pull 1 knee up against your chest and hold for 20 to 30 seconds. Repeat this with the other knee. This may be done with the other leg straight or bent, whichever feels better. Then, pull both knees up against your chest. Sit-Ups or Curl-Ups.  Bend your knees 90 degrees. Start with tilting your pelvis, and do a partial, slow sit-up. Only lift your upper half 30 to 45 degrees off the floor. Take at least 2 to 3 seonds for each sit-up. Do not do sit-ups with your knees out straight. If partial sit-ups are difficult, simply do the above but with only tightening your belly (abdominal) muscles and holding it as told. Hip-Lift.  Lie on your back with your knees flexed 90 degrees. Push down with your feet and shoulders as you raise your hips 2 inches off the floor. Hold for 10 seconds, repeat 5 to 10 times. Back Arches.  Lie on your stomach. Prop yourself up on bent elbows. Slowly press on your hands, causing an arch in your low back. Repeat 3 to 5 times. Shoulder-Lifts.  Lie face down with arms beside your body. Keep hips and belly pressed to floor as you slowly lift your head and shoulders off the floor. Do not overdo your exercises. Be careful in the beginning.  Exercises may cause you some mild back discomfort. If the pain lasts for more than 15 minutes, stop the exercises until you see your doctor. Improvement with exercise for back problems is slow.  Document Released: 02/21/2010 Document Revised: 04/13/2011 Document Reviewed: 11/20/2010 [facility] Patient Information 2014 [place], [place].  Tremor Tremor is a rhythmic, involuntary muscular contraction characterized by oscillations (to-and-fro movements) of a part of the body. The most common of all involuntary movements, tremor can affect various body parts such as the hands, head, facial structures, vocal cords, trunk, and legs; most tremors, however, occur in the hands. Tremor often accompanies neurological disorders associated with aging. Although the disorder is not life-threatening, it can be responsible for functional disability and social embarrassment. TREATMENT  There are many types of tremor and several ways in which tremor is classified. The most common classification is by behavioral context or position. There are five categories of tremor within this classification: resting, postural, kinetic, task-specific, and psychogenic. Resting or static tremor occurs when the muscle is at rest, for example when the hands are lying on the lap. This type of tremor is often seen in patients with Parkinson's disease. Postural tremor occurs when a patient attempts to maintain posture, such as holding the hands outstretched. Postural tremors include physiological tremor, essential tremor, tremor with basal ganglia disease (also seen in patients with Parkinson's disease), cerebellar postural tremor, tremor with peripheral neuropathy, post-traumatic  tremor, and alcoholic tremor. Kinetic or intention (action) tremor occurs during purposeful movement, for example during finger-to-nose testing. Task-specific tremor appears when performing goal-oriented tasks such as handwriting, speaking, or standing. This group consists of  primary writing tremor, vocal tremor, and orthostatic tremor. Psychogenic tremor occurs in both older and younger patients. The key feature of this tremor is that it dramatically lessens or disappears when the patient is distracted. PROGNOSIS There are some treatment options available for tremor; the appropriate treatment depends on accurate diagnosis of the cause. Some tremors respond to treatment of the underlying condition, for example in some cases of psychogenic tremor treating the patient's underlying mental problem may cause the tremor to disappear. Also, patients with tremor due to Parkinson's disease may be treated with Levodopa drug therapy. Symptomatic drug therapy is available for several other tremors as well. For those cases of tremor in which there is no effective drug treatment, physical measures such as teaching the patient to brace the affected limb during the tremor are sometimes useful. Surgical intervention such as thalamotomy or deep brain stimulation may be useful in certain cases. Document Released: 01/09/2002 Document Revised: 04/13/2011 Document Reviewed: 01/19/2005 [facility] Patient Information 2014 [place], [place].

## 2013-04-07 NOTE — ED Provider Notes (Signed)
Craig Hahn is a 53 y.o. male who presents to Urgent Care today for  1) left low back pain. Present for several months worse recently. Pain radiates from the left low back to the leg area he denies any weakness or numbness. This has happened before. He denies any injury. He has tried over-the-counter medications which helped a bit. No fevers or chills nausea vomiting or diarrhea.  2) tremor: Patient has bilateral hand tremor present at both rest and with motion. He denies any weakness or numbness or difficulty initiating motion. He feels well otherwise; he is somewhat anxious by this.  She denies any abdominal pain fevers chills nausea vomiting or diarrhea. He denies any significant dysuria.   History reviewed. No pertinent past medical history. History  Substance Use Topics  . Smoking status: Current Every Day Smoker -- 1.00 packs/day  . Smokeless tobacco: Not on file  . Alcohol Use: Yes   ROS as above Medications: No current facility-administered medications for this encounter.   Current Outpatient Prescriptions  Medication Sig Dispense Refill  . warfarin (COUMADIN) 7.5 MG tablet Take 7.5 mg by mouth daily.      . Yohimbine HCl (TESTOMAR PO) Take by mouth.      . PredniSONE 10 MG KIT 12 day dose pack po  1 kit  0    Exam:  BP 146/88  Pulse 69  Temp(Src) 97.9 F (36.6 C) (Oral)  Resp 20  SpO2 100% Gen: Well NAD HEENT: EOMI,  MMM Lungs: Normal work of breathing. CTABL Heart: RRR no MRG Abd: NABS, Soft. NT, ND Exts: Brisk capillary refill, warm and well perfused.  Back: Nontender to spinal midline. Tender palpation left SI joint. Negative straight leg raise test Corky Sox test to pretzel stretch bilaterally. Normal gait. Reflexes are equal bilateral extremity. Strength is intact. Neuro: Slight motion tremor bilateral hands. Strength is intact bilateral upper extremities. Very subtle cogwheeling bilaterally  Results for orders placed during the hospital encounter of 04/07/13  (from the past 24 hour(s))  POCT URINALYSIS DIP (DEVICE)     Status: None   Collection Time    04/07/13  6:52 PM      Result Value Ref Range   Glucose, UA NEGATIVE  NEGATIVE mg/dL   Bilirubin Urine NEGATIVE  NEGATIVE   Ketones, ur NEGATIVE  NEGATIVE mg/dL   Specific Gravity, Urine >=1.030  1.005 - 1.030   Hgb urine dipstick NEGATIVE  NEGATIVE   pH 5.5  5.0 - 8.0   Protein, ur NEGATIVE  NEGATIVE mg/dL   Urobilinogen, UA 0.2  0.0 - 1.0 mg/dL   Nitrite NEGATIVE  NEGATIVE   Leukocytes, UA NEGATIVE  NEGATIVE   Dg Lumbar Spine Complete  04/07/2013   CLINICAL DATA:  Low back pain without injury  EXAM: LUMBAR SPINE - COMPLETE 4+ VIEW  COMPARISON:  None.  FINDINGS: Five lumbar type vertebral bodies are well visualized. A mild scoliosis concave to the right is seen. No spondylolysis is noted. Very mild degenerative spondylolisthesis is noted of L4 on L5. Mild osteophytes are noted as well. Mild aortic calcifications are noted without aneurysmal dilatation.  IMPRESSION: Mild degenerative change with anterolisthesis as described.   Electronically Signed   By: Inez Catalina M.D.   On: 04/07/2013 19:26    Assessment and Plan: 53 y.o. male with  1) back pain: Likely sciatica. Plan for prednisone Dosepak and watchful waiting. Discussed home exercise program. Urine cytology pending as well.  2) tremor; unclear etiology. Patient has components of benign  essential tremor and also parkinsonism.  I prefer to refer patient to neurology however he will not let me. He wishes to try home remedies. We have agreed for referral to neurology if not improved in 3 months.  Patient is very reluctant to go to doctors.   Discussed warning signs or symptoms. Please see discharge instructions. Patient expresses understanding.    Gregor Hams, MD 04/07/13 2059

## 2013-04-10 LAB — URINE CYTOLOGY ANCILLARY ONLY
Chlamydia: NEGATIVE
Neisseria Gonorrhea: NEGATIVE
TRICH (WINDOWPATH): NEGATIVE

## 2013-07-10 ENCOUNTER — Ambulatory Visit (INDEPENDENT_AMBULATORY_CARE_PROVIDER_SITE_OTHER): Payer: Self-pay | Admitting: Family Medicine

## 2013-07-10 VITALS — BP 112/70 | HR 75 | Temp 98.0°F | Resp 16 | Ht 73.0 in | Wt 172.6 lb

## 2013-07-10 DIAGNOSIS — D689 Coagulation defect, unspecified: Secondary | ICD-10-CM

## 2013-07-10 MED ORDER — WARFARIN SODIUM 5 MG PO TABS: ORAL_TABLET | ORAL | Status: DC

## 2013-07-10 NOTE — Progress Notes (Signed)
   Subjective:    Patient ID: Craig Hahn, male    DOB: 1960/02/14, 53 y.o.   MRN: [id number] Chief Complaint  Patient presents with  . Medication Refill    coumadin    HPI  Has a clotting disorder -  Since 2003 - has been coumadin - has been on the same dose for about 5-6 years.  Past Medical History  Diagnosis Date  . Clotting disorder   . Anemia   . Depression    No current outpatient prescriptions on file prior to visit.   No current facility-administered medications on file prior to visit.   Allergies  Allergen Reactions  . Codeine   . Penicillins   . Sulfa Antibiotics      Review of Systems  Constitutional: Negative for fever and chills.  Eyes: Negative for visual disturbance.  Respiratory: Negative for shortness of breath.   Cardiovascular: Negative for chest pain and leg swelling.  Gastrointestinal: Negative for blood in stool and anal bleeding.  Genitourinary: Negative for hematuria.  Neurological: Negative for dizziness, syncope, facial asymmetry, weakness, light-headedness and headaches.       Objective:  BP 112/70  Pulse 75  Temp(Src) 98 F (36.7 C) (Oral)  Resp 16  Ht 6\' 1"  (1.854 m)  Wt 172 lb 9.6 oz (78.291 kg)  BMI 22.78 kg/m2  SpO2 98%  Physical Exam  Constitutional: He is oriented to person, place, and time. He appears well-developed and well-nourished. No distress.  HENT:  Head: Normocephalic and atraumatic.  Eyes: Conjunctivae are normal. Pupils are equal, round, and reactive to light. No scleral icterus.  Neck: Normal range of motion. Neck supple. No thyromegaly present.  Cardiovascular: Normal rate, regular rhythm, normal heart sounds and intact distal pulses.   Pulmonary/Chest: Effort normal and breath sounds normal. No respiratory distress.  Musculoskeletal: He exhibits no edema.  Lymphadenopathy:    He has no cervical adenopathy.  Neurological: He is alert and oriented to person, place, and time.  Skin: Skin is warm and dry. He  is not diaphoretic.  Psychiatric: He has a normal mood and affect. His behavior is normal.          Assessment & Plan:   Clotting disorder - Plan: Protime-INR  Meds ordered this encounter  Medications  . warfarin (COUMADIN) 5 MG tablet    Sig: Take as directed by physician    Dispense:  45 tablet    Refill:  0    Norberto Sorenson, MD MPH

## 2013-07-10 NOTE — Patient Instructions (Addendum)
Please call the clinic below to get an appointment asap.  They are a clinic set up by [facility] to care for people without health insurance so may be able to get you care at much cheaper cost with a large discount on labs, imaging, and medication. [facility] [address] [place], [place] [address] Hours of Operation Mon - Fri: 9 a.m. - 6 p.m. Main: [phone] The orange card program is sponsored by the [facility]. Call [phone] to see if this might be something for which you are eligible. This can ensure you have access to the medical and dental services that [facility] help provide to people without health insurance.   Warfarin: What You Need to Know Warfarin is an anticoagulant. Anticoagulants help prevent the formation of blood clots. They also help stop the growth of blood clots. Warfarin is sometimes referred to as a "blood thinner."  Normally, when body tissues are cut or damaged, the blood clots in order to prevent blood loss. Sometimes clots form inside your blood vessels and obstruct the flow of blood through your circulatory system (thrombosis). These clots may travel through your bloodstream and become lodged in smaller blood vessels in your brain, which can cause a stroke, or your lungs (pulmonary embolism). WHO SHOULD USE WARFARIN? Warfarin is prescribed for people at risk of developing harmful blood clots:  People with surgically implanted mechanical heart valves, irregular heart rhythms called atrial fibrillation, and certain clotting disorders.  People who have developed harmful blood clotting in the past, including those who have had a stroke or a pulmonary embolism, or thrombosis in their legs (deep vein thrombosis [DVT]).  People with an existing blood clot such as a pulmonary embolism. WARFARIN DOSING Warfarin tablets come in different strengths. Each tablet strength is a  different color, with the amount of warfarin (in milligrams) clearly printed on the tablet. If the color of your tablet is different than usual when you receive a new prescription, report it immediately to your pharmacist or health care provider. WARFARIN MONITORING The goal of warfarin therapy is to lessen the clotting tendency of blood but not to prevent clotting completely. Your health care provider will monitor the anticoagulation effect of warfarin closely and adjust your dose as needed. For your safety, blood tests called prothrombin time (PT) or international normalized ratio (INR) are used to measure the effects of warfarin. Both of these tests can be done with a finger stick or a blood draw. The longer it takes the blood to clot, the higher the PT or INR. Your health care provider will inform you of your "target" PT or INR range. If, at any time, your PT or INR is above the target range, there is a risk of bleeding. If your PT or INR is below the target range, there is a risk of clotting. Whether you are started on warfarin while you are in the hospital, or in your health care provider's office, you will need to have your PT or INR checked within one week of starting the medicine. Initially, some people are asked to have their PT or INR checked as much as twice a week. Once you are on a stable maintenance dose, the PT or INR is checked less often, usually once every 2 to 4 weeks. The warfarin dose may be adjusted if the PT or INR is not within the target range. It is important to keep all laboratory and  health care provider follow-up appointments.  WHAT ARE THE SIDE EFFECTS OF WARFARIN?  Too much warfarin can cause bleeding (hemorrhage) from any part of the body. This may include bleeding from the gums, blood in the urine, bloody or dark stools, a nosebleed that is not easily stopped, coughing up blood, or vomiting blood.  Too little warfarin can increase the risk of blood clots.  Too little or  too much warfarin can also increase the risk of a stroke.  Warfarin use may cause a skin rash or irritation, an unusual fever, continual nausea or stomach upset, or severe pain in your joints or back. SPECIAL PRECAUTIONS WHILE TAKING WARFARIN Warfarin should be taken exactly as directed:  Take your medicine at the same time every day. If you forget to take your dose, you can take it if it is within 6 hours of when it was due.  Do not change the dose of warfarin on your own to make up for missed or extra doses.  If you miss more than 2 doses in a row, you should contact your health care provider for advice. Avoid situations that cause bleeding. You may have a tendency to bleed more easily than usual while taking warfarin. The following actions can limit bleeding:  Using a softer toothbrush.  Flossing with waxed floss rather than unwaxed floss.  Shaving with an Neurosurgeonelectric razor rather than a blade.  Limiting the use of sharp objects.  Avoiding potentially harmful activities such as contact sports. Warfarin and Pregnancy or Breastfeeding  Warfarin is not advised during the first trimester of pregnancy due to an increased risk of birth defects. In certain situations, a woman may take warfarin after her first trimester of pregnancy. A woman who becomes pregnant or plans to become pregnant while taking warfarin should notify her health care provider immediately.  Although warfarin does not pass into breast milk, a woman who wishes to breastfeed while taking warfarin should also consult with her health care provider. Alcohol, Smoking, and Illicit Drug Use  Alcohol affects how warfarin works in the body. It is best to avoid alcoholic drinks or consume very small amounts while taking warfarin. In general, alcohol intake should be limited to 1 oz (30 mL) of liquor, 6 oz (180 mL) of wine, or 12 oz (360 mL) of beer each day. Notify your health care provider if you change your alcohol  intake.  Smoking affects how warfarin works. It is best to avoid smoking while taking warfarin. Notify your health care provider if you change your smoking habits.  It is best to avoid all illicit drugs while taking warfarin since there are few studies that show how warfarin interacts with these drugs. Other Medicines and Dietary Supplements Many prescription and over-the-counter medicines can interfere with warfarin. Be sure all of your health care providers know you are taking warfarin. Notify your health care provider who prescribed warfarin for you before starting or stopping any new medicines, including over-the-counter vitamins, dietary supplements, and pain medicines. Your warfarin dose may need to be adjusted. Some common over-the-counter medicines that may increase the risk of bleeding while taking warfarin include:   Acetaminophen.  Aspirin.  Nonsteroidal anti-inflammatory medicines such as ibuprofen or naproxen.  Vitamin E. Dietary Considerations  Foods that have moderate or high amounts of vitamin K can interfere with warfarin. Avoid major changes in your diet or notify your health care provider before changing your diet. Eat a consistent amount of foods that have moderate or high amounts of  vitamin K.Eating less foods containing vitamin K can increase the risk of bleeding. Eating more foods containing vitamin K can increase the risk of blood clots. Additional questions about dietary considerations can be discussed with a dietitian. The serving size for foods containing moderate or high amounts of vitamin K are  cup cooked (120 mL or noted gram weight) or 1 cup raw (240 mL or noted gram weight), unless otherwise noted. These foods include: Proteins  Beef liver, 3.5 oz (100 g).  Pork liver, 3.5 oz (100 g). Legumes  Soybean oil.  Soybeans.  Garbanzo beans.  Green peas.  Black-eyed peas. Leafy green vegetables  Kale.  Spinach.  Nettle greens.  Swiss  chard.  Watercress.  Endive.  Parsley, 1 tbsp (4 g).  Turnip greens.  Collard greens.  Seaweed, limit 2 sheets.  Beet greens.  Dandelion greens.  Mustard greens.  Green Lead and Romaine lettuce. Cruciferous vegetables  Broccoli.  Cabbage (green or [place]).  Brussels sprouts.  Cauliflower.  Asparagus. Miscellaneous  Onions, green onions, or spring onions.  Green tea made with  oz (14 g) or more of dried tea.  Herbal teas containing coumarin.  Spinach noodles.  Okra.  Prunes.  Rosita Fire. CALL YOUR CLINIC OR HEALTH CARE PROVIDER IF YOU:  Plan to have any surgery or procedure.  Feel sick, especially if you have diarrhea or vomiting.  Experience or anticipate any major changes in your diet.  Start or stop a prescription or over-the-counter medicine.  Become, plan to become, or think you may be pregnant.  Are having heavier than usual menstrual periods.  Have had a fall, accident, or any symptoms of bleeding or unusual bruising.  An unusual fever. CALL 911 IN THE [place] OR GO TO THE EMERGENCY DEPARTMENT IF YOU:   Think you may be having an allergic reaction to warfarin. The signs of an allergic reaction could include itching, rash, hives, swelling, chest tightness, or trouble breathing.  See signs of blood in your urine. The signs could include reddish, pinkish, or tea-colored urine.  See signs of blood in your stools. The signs could include bright red or black stools.  Vomit or cough up blood. In these instances, the blood could have either a bright red or a "coffee-grounds" appearance.  Have bleeding that will not stop after applying pressure for 30 minutes such as cuts, nosebleeds, other injuries.  Have severe pain in your joints or back.  Have a new and severe headache.  Have sudden weakness or numbness of your face, arm, or leg, especially on one side of your body.  Have sudden confusion or trouble understanding.  Have sudden trouble  seeing in one or both eyes.  Have sudden trouble walking, dizziness, loss of balance, or coordination.  Have aphasia. Document Released: 01/19/2005 Document Revised: 10/14/2011 Document Reviewed: 07/15/2012 [facility] Patient Information 2014 [place], [place].

## 2013-07-11 ENCOUNTER — Encounter: Payer: Self-pay | Admitting: Family Medicine

## 2013-07-11 LAB — PROTIME-INR
INR: 2.95 — ABNORMAL HIGH (ref <1.50)
PROTHROMBIN TIME: 29.9 s — AB (ref 11.6–15.2)

## 2013-08-09 ENCOUNTER — Telehealth: Payer: Self-pay

## 2013-08-09 DIAGNOSIS — Z5181 Encounter for therapeutic drug level monitoring: Secondary | ICD-10-CM

## 2013-08-09 NOTE — Telephone Encounter (Signed)
PT STATES HE TAKE A BLOOD THINNER AND WAS TOLD BY DR Katrinka BlazingSMITH TO ENROL IN A PROGRAM WITH THE [facility] BUT THEY ARE NOT TAKING ON ANY  NEW PATIENTS AT ALL.  HE IS ON COUMADIN 5MG S. PLEASE CALL [phone]    [facility] ON [address]

## 2013-08-09 NOTE — Telephone Encounter (Signed)
Pt doesn't have insurance so was referred to [place] and [facility]. If he can't get in there, rec he look into the orange card program which is sponsored by the [facility]. Call [phone] to see if this might be something for which you are eligible. This can ensure you have access to the medical and dental services that [facility] help provide to people without health insurance.  They should have a list of low cost clinics. Otherwise most private practices as coumadin clinics and he doesn't need referral. He should just call and see who can do INRs in their office as that will be cheaper than us having to send it to an outside lab. [facility] has a coumadin clinic I think.

## 2013-08-09 NOTE — Telephone Encounter (Signed)
Pt needs a referral to a coumadin clinic- please advise where to send pt information

## 2013-08-09 NOTE — Telephone Encounter (Signed)
L[facility] with information for [facility] to [facility]. Rtn call for further information if needed.

## 2013-08-10 NOTE — Telephone Encounter (Signed)
Pt is able to cover the cost of the medication just is running out of the blood thinner and no doctor

## 2013-08-11 MED ORDER — WARFARIN SODIUM 5 MG PO TABS: ORAL_TABLET | ORAL | Status: DC

## 2013-08-11 NOTE — Telephone Encounter (Signed)
Advised pt- he understands to RTC in 2 months.

## 2013-08-11 NOTE — Telephone Encounter (Signed)
Refilled x 3 mos. Recommend f/u for repeat INR in 2 months at most - future lab order entered for this (so pt can do fast track lab only visit and not have to see me and have to pay for a doctor's visit). Remember to be REALLY consistent with vitamin K containing foods/diet.

## 2013-08-11 NOTE — Telephone Encounter (Signed)
Pt needs a refill on his coumadin. He does not want to enroll in any program but does need a dr to monitor it. Advised pt we do not monitor coumadin/PT INR's here. Pt wants me to check if Dr. Clelia CroftShaw would be willing as an exception. He does not have insurance right now. As soon as he gets back on his feet he will finding someplace to go.

## 2013-08-11 NOTE — Addendum Note (Signed)
Addended by: Norberto SorensonSHAW, Zadin Lange on: 08/11/2013 05:14 PM   Modules accepted: Orders

## 2013-11-23 ENCOUNTER — Ambulatory Visit (INDEPENDENT_AMBULATORY_CARE_PROVIDER_SITE_OTHER): Payer: Self-pay | Admitting: Emergency Medicine

## 2013-11-23 VITALS — BP 118/72 | HR 66 | Temp 98.5°F | Resp 18 | Ht 73.0 in | Wt 182.0 lb

## 2013-11-23 DIAGNOSIS — Z5181 Encounter for therapeutic drug level monitoring: Secondary | ICD-10-CM

## 2013-11-23 DIAGNOSIS — Z7901 Long term (current) use of anticoagulants: Secondary | ICD-10-CM

## 2013-11-23 DIAGNOSIS — I82409 Acute embolism and thrombosis of unspecified deep veins of unspecified lower extremity: Secondary | ICD-10-CM

## 2013-11-23 LAB — PROTIME-INR
INR: 3 — ABNORMAL HIGH (ref <1.50)
Prothrombin Time: 31.1 seconds — ABNORMAL HIGH (ref 11.6–15.2)

## 2013-11-23 MED ORDER — WARFARIN SODIUM 5 MG PO TABS: ORAL_TABLET | ORAL | Status: DC

## 2013-11-23 NOTE — Progress Notes (Deleted)
   Subjective:    Patient ID: Craig Hahn, male    DOB: 01-24-1961, 53 y.o.   MRN: [id number]  HPI    Review of Systems     Objective:   Physical Exam        Assessment & Plan:

## 2013-11-23 NOTE — Progress Notes (Signed)
   Subjective:    Patient ID: Craig Hahn, male    DOB: 1960-09-21, [age] y.o.   MRN: [id number] This chart was scribed for Lesle ChrisSteven Seleny Allbright, MD by Jolene Provostobert Halas, Medical Scribe. This patient was seen in Room 2 and the patient's care was started at 1:19 PM.  HPI HPI Comments: Craig Hahn is a 53 y.o. male with a past hx of blood clots who [facility]mad[facility]. Pt states his PCP is Dr Clelia CroftShaw. 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 Pt states that his last blood clot was 4 years ago. Pt smokes 1 PPD. Pt states he drinks 4-5 beers per night. Pt states he has never had bleeding problems. Pt states he is having tooth surgery on November 11th, 2015. Pt states the dentist stated that he is not worried about blood loss from the procedure. Pt states that he takes 7.5mg  of coumadin daily, except every Monday he takes 10mg .     Review of Systems  Constitutional: Negative for fever and chills.  Gastrointestinal: Negative for nausea and vomiting.  Neurological: Negative for dizziness and headaches.  Psychiatric/Behavioral: Negative for agitation.       Objective:   Physical Exam  Nursing note and vitals reviewed. Constitutional: He is oriented to person, place, and time. He appears well-developed and well-nourished.  HENT:  Head: Normocephalic and atraumatic.  Bad dental disease, right lower k-9 broken at the base.   Eyes: Pupils are equal, round, and reactive to light.  Neck: No JVD present.  Cardiovascular: Normal rate, regular rhythm and normal heart sounds.   No calf tenderness.   Pulmonary/Chest: Effort normal and breath sounds normal. No respiratory distress.  Abdominal: Soft. There is no tenderness.  Neurological: He is alert and oriented to person, place, and time.  Skin: Skin is warm and dry.  Psychiatric: He has a normal mood and affect. His behavior is normal.          Assessment & Plan:   Patient has 6-7 drinks per night smokes a pack a day he  is not interested in stopping either of these. He states his last physician dismissed him. He has seen Dr. Clelia CroftShaw who agreed to follow his Coumadin level since he had no other resources to keep this monitored.

## 2014-03-07 ENCOUNTER — Other Ambulatory Visit: Payer: Self-pay | Admitting: Emergency Medicine

## 2014-03-08 ENCOUNTER — Ambulatory Visit (INDEPENDENT_AMBULATORY_CARE_PROVIDER_SITE_OTHER): Payer: Self-pay | Admitting: Physician Assistant

## 2014-03-08 VITALS — BP 118/80 | HR 56 | Temp 98.3°F | Resp 18 | Ht 73.0 in | Wt 182.4 lb

## 2014-03-08 DIAGNOSIS — Z5181 Encounter for therapeutic drug level monitoring: Secondary | ICD-10-CM

## 2014-03-08 DIAGNOSIS — Z7901 Long term (current) use of anticoagulants: Secondary | ICD-10-CM

## 2014-03-08 DIAGNOSIS — Z862 Personal history of diseases of the blood and blood-forming organs and certain disorders involving the immune mechanism: Secondary | ICD-10-CM

## 2014-03-08 MED ORDER — WARFARIN SODIUM 5 MG PO TABS: ORAL_TABLET | ORAL | Status: DC

## 2014-03-08 NOTE — Progress Notes (Signed)
   Subjective:    Patient ID: Craig Hahn, male    DOB: Dec 09, 1960, 54 y.o.   MRN: [id number]  Chief Complaint  Patient presents with  . Lab Work    Pt. takes blood thinners and needs lab work.    Prior to Admission medications   Medication Sig Start Date End Date Taking? Authorizing Provider  warfarin (COUMADIN) 5 MG tablet Take as directed by physician 03/08/14  Yes Raelyn Ensignodd Izaih Kataoka, PA   Medications, allergies, past medical history, surgical history, family history, social history and problem list reviewed and updated.  HPI  [age] yom with hx clotting disorder needs pt/inr and coumadin refill.  Per pt has hx 9 blood clots, most recent in 2006. He has never formally been worked up and does not know what the cause is. He has not had insurance for many yrs. He mentions he has two brothers who have both had blood clots.   He has been on coumadin since 2003. Has been steady on this for many yrs. Last he was here 4 months ago he was taking 7.5 mg per day but 10 mg on Mondays. 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   He quit smoking few months ago, now using e-cigs. Continues to drink 4-5 beers per day which he has done for many yrs.   He is aware of NOACs but cannot afford them.   Denies any blood in stool, blood in urine, presyncope, syncope, palps, unusual sob, cp.   Review of Systems See HPI.     Objective:   Physical Exam  Constitutional: He appears well-developed and well-nourished.  Non-toxic appearance. He does not have a sickly appearance. He does not appear ill. No distress.  BP 118/80 mmHg  Pulse 56  Temp(Src) 98.3 F (36.8 C) (Oral)  Resp 18  Ht 6\' 1"  (1.854 m)  Wt 182 lb 6.4 oz (82.736 kg)  BMI 24.07 kg/m2  SpO2 99%   Cardiovascular: Normal rate, regular rhythm and normal heart sounds.  Exam reveals no gallop.   No murmur heard. Musculoskeletal:       Right lower leg: He exhibits no  tenderness and no swelling.       Left lower leg: He exhibits no tenderness and no swelling.      Assessment & Plan:   [age] yom with hx clotting disorder needs pt/inr and coumadin refill.  Encounter for monitoring coumadin therapy - Plan: Protime-INR History of blood clotting disorder - Plan: warfarin (COUMADIN) 5 MG tablet --pt/inr today, last 2 draws have been stable --coumadin refilled for 3 months, will adjust dose if needed pending todays results --instructed to rtc for pt/inr in one month --has never had cpe, encouraged to have cpe asap, he plans to contact office and will have one in 3 months, cmet at that visit --declines referral for hematology --encouraged alcohol cessation --er for blood in stool/urine, dizzy, syncope, sob, cp --coumadin instructions given  Donnajean Lopesodd M. Tequila Rottmann, PA-C Physician Assistant-Certified [facility]  03/08/2014 6:23 PM

## 2014-03-08 NOTE — Telephone Encounter (Signed)
Dr Clelia CroftShaw, in Dr Maylon Peppersaub's OV notes from Oct, he advised that you had agreed to manage pt's coumadin. Do you want to RF or pt to RTC for OV and/or Labs?

## 2014-03-08 NOTE — Patient Instructions (Addendum)
I have refilled your coumadin for 3 months. Please continue to take 7.5 mg (1.5 pills) per day.  We drew your PT/INR today and I will be in contact with you with those results and may adjust the dose as necessary.  If you experience any dizziness, passing out, blood in stool, blood in urine, chest pain, or unusual shortness of breath go to the ED right away.  Please come back to see [place] in one month for another PT/INR check. These should be done once a month in somebody with stable readings.  It would be best to cut out alcohol completley since you are on a blood thinner.  Warfarin: What You Need to Know Warfarin is an anticoagulant. Anticoagulants help prevent the formation of blood clots. They also help stop the growth of blood clots. Warfarin is sometimes referred to as a "blood thinner."  Normally, when body tissues are cut or damaged, the blood clots in order to prevent blood loss. Sometimes clots form inside your blood vessels and obstruct the flow of blood through your circulatory system (thrombosis). These clots may travel through your bloodstream and become lodged in smaller blood vessels in your brain, which can cause a stroke, or in your lungs (pulmonary embolism). WHO SHOULD USE WARFARIN? Warfarin is prescribed for people at risk of developing harmful blood clots:  People with surgically implanted mechanical heart valves, irregular heart rhythms called atrial fibrillation, and certain clotting disorders.  People who have developed harmful blood clotting in the past, including those who have had a stroke or a pulmonary embolism, or thrombosis in their legs (deep vein thrombosis [DVT]).  People with an existing blood clot, such as a pulmonary embolism. WARFARIN DOSING Warfarin tablets come in different strengths. Each tablet strength is a different color, with the amount of warfarin (in milligrams) clearly printed on the tablet. If the color of your tablet is different than usual when you  receive a new prescription, report it immediately to your pharmacist or health care provider. WARFARIN MONITORING The goal of warfarin therapy is to lessen the clotting tendency of blood but not prevent clotting completely. Your health care provider will monitor the anticoagulation effect of warfarin closely and adjust your dose as needed. For your safety, blood tests called prothrombin time (PT) or international normalized ratio (INR) are used to measure the effects of warfarin. Both of these tests can be done with a finger stick or a blood draw. The longer it takes the blood to clot, the higher the PT or INR. Your health care provider will inform you of your "target" PT or INR range. If, at any time, your PT or INR is above the target range, there is a risk of bleeding. If your PT or INR is below the target range, there is a risk of clotting. Whether you are started on warfarin while you are in the hospital or in your health care provider's office, you will need to have your PT or INR checked within one week of starting the medicine. Initially, some people are asked to have their PT or INR checked as much as twice a week. Once you are on a stable maintenance dose, the PT or INR is checked less often, usually once every 2 to 4 weeks. The warfarin dose may be adjusted if the PT or INR is not within the target range. It is important to keep all laboratory and health care provider follow-up appointments. Not keeping appointments could result in a chronic or permanent injury,  pain, or disability because warfarin is a medicine that requires close monitoring. WHAT ARE THE SIDE EFFECTS OF WARFARIN?  Too much warfarin can cause bleeding (hemorrhage) from any part of the body. This may include bleeding from the gums, blood in the urine, bloody or dark stools, a nosebleed that is not easily stopped, coughing up blood, or vomiting blood.  Too little warfarin can increase the risk of blood clots.  Too little or too  much warfarin can also increase the risk of a stroke.  Warfarin use may cause a skin rash or irritation, an unusual fever, continual nausea or stomach upset, or severe pain in your joints or back. SPECIAL PRECAUTIONS WHILE TAKING WARFARIN Warfarin should be taken exactly as directed. It is very important to take warfarin as directed since bleeding or blood clots could result in chronic or permanent injury, pain, or disability.  Take your medicine at the same time every day. If you forget to take your dose, you can take it if it is within 6 hours of when it was due.  Do not change the dose of warfarin on your own to make up for missed or extra doses.  If you miss more than 2 doses in a row, you should contact your health care provider for advice. Avoid situations that cause bleeding. You may have a tendency to bleed more easily than usual while taking warfarin. The following actions can limit bleeding:  Using a softer toothbrush.  Flossing with waxed floss rather than unwaxed floss.  Shaving with an Neurosurgeonelectric razor rather than a blade.  Limiting the use of sharp objects.  Avoiding potentially harmful activities, such as contact sports. Warfarin and Pregnancy or Breastfeeding  Warfarin is not advised during the first trimester of pregnancy due to an increased risk of birth defects. In certain situations, a woman may take warfarin after her first trimester of pregnancy. A woman who becomes pregnant or plans to become pregnant while taking warfarin should notify her health care provider immediately.  Although warfarin does not pass into breast milk, a woman who wishes to breastfeed while taking warfarin should also consult with her health care provider. Alcohol, Smoking, and Illicit Drug Use  Alcohol affects how warfarin works in the body. It is best to avoid alcoholic drinks or consume very small amounts while taking warfarin. In general, alcohol intake should be limited to 1 oz (30 mL) of  liquor, 6 oz (180 mL) of wine, or 12 oz (360 mL) of beer each day. Notify your health care provider if you change your alcohol intake.  Smoking affects how warfarin works. It is best to avoid smoking while taking warfarin. Notify your health care provider if you change your smoking habits.  It is best to avoid all illicit drugs while taking warfarin since there are few studies that show how warfarin interacts with these drugs. Other Medicines and Dietary Supplements Many prescription and over-the-counter medicines can interfere with warfarin. Be sure all of your health care providers know you are taking warfarin. Notify your health care provider who prescribed warfarin for you or your pharmacist before starting or stopping any new medicines, including over-the-counter vitamins, dietary supplements, and pain medicines. Your warfarin dose may need to be adjusted. Some common over-the-counter medicines that may increase the risk of bleeding while taking warfarin include:   Acetaminophen.  Aspirin.  Nonsteroidal anti-inflammatory medicines (NSAIDs), such as ibuprofen or naproxen.  Vitamin E. Dietary Considerations  Foods that have moderate or high amounts of vitamin  K can interfere with warfarin. Avoid major changes in your diet or notify your health care provider before changing your diet. Eat a consistent amount of foods that have moderate or high amounts of vitamin K. Eating less foods containing vitamin K can increase the risk of bleeding. Eating more foods containing vitamin K can increase the risk of blood clots. Additional questions about dietary considerations can be discussed with a dietitian. Foods that are very high in vitamin K:  Greens, such as Swiss chard and beet, collard, mustard, or turnip greens (fresh or frozen, cooked).  Kale (fresh or frozen, cooked).  Parsley (raw).  Spinach (cooked). Foods that are high in vitamin K:  Asparagus (frozen, cooked).  Beans, green  (frozen, cooked).  Broccoli.  Bok choy (cooked).  Brussels sprouts (fresh or frozen, cooked).  Cabbage (cooked).   Coleslaw. Foods that are moderately high in vitamin K:  Blueberries.  Black-eyed peas.  Endive (raw).  Green leaf lettuce (raw).  Green scallions (raw).  Kale (raw).  Okra (frozen, cooked).  Plantains (fried).  Romaine lettuce (raw).  Sauerkraut (canned).  Spinach (raw). CALL YOUR CLINIC OR HEALTH CARE PROVIDER IF YOU:  Plan to have any surgery or procedure.  Feel sick, especially if you have diarrhea or vomiting.  Experience or anticipate any major changes in your diet.  Start or stop a prescription or over-the-counter medicine.  Become, plan to become, or think you may be pregnant.  Are having heavier than usual menstrual periods.  Have had a fall, accident, or any symptoms of bleeding or unusual bruising.  Develop an unusual fever. CALL 911 IN THE [place] OR GO TO THE EMERGENCY DEPARTMENT IF YOU:   Think you may be having an allergic reaction to warfarin. The signs of an allergic reaction could include itching, rash, hives, swelling, chest tightness, or trouble breathing.  See signs of blood in your urine. The signs could include reddish, pinkish, or tea-colored urine.  See signs of blood in your stools. The signs could include bright red or black stools.  Vomit or cough up blood. In these instances, the blood could have either a bright red or a "coffee-grounds" appearance.  Have bleeding that will not stop after applying pressure for 30 minutes such as cuts, nosebleeds, or other injuries.  Have severe pain in your joints or back.  Have a new and severe headache.  Have sudden weakness or numbness of your face, arm, or leg, especially on one side of your body.  Have sudden confusion or trouble understanding.  Have sudden trouble seeing in one or both eyes.  Have sudden trouble walking, dizziness, loss of balance, or  coordination.  Have trouble speaking or understanding (aphasia). Document Released: 01/19/2005 Document Revised: 06/05/2013 Document Reviewed: 07/15/2012 [facility] Patient Information 2015 [place], [place]. This information is not intended to replace advice given to you by your health care provider. Make sure you discuss any questions you have with your health care provider.

## 2014-03-09 LAB — PROTIME-INR
INR: 2.3 — ABNORMAL HIGH (ref <1.50)
Prothrombin Time: 25.3 seconds — ABNORMAL HIGH (ref 11.6–15.2)

## 2014-03-10 ENCOUNTER — Telehealth: Payer: Self-pay | Admitting: *Deleted

## 2014-03-10 NOTE — Telephone Encounter (Signed)
Pt called about refill of coumadin.  Called the pharmacy and they stated that they would have to change to a different generic.  Checked will Chelle and she stated the change was ok but pt needs to come back in to recheck coumadin level in about 4-6 days.  Pt advised

## 2014-03-11 ENCOUNTER — Encounter: Payer: Self-pay | Admitting: Radiology

## 2014-03-12 NOTE — Addendum Note (Signed)
Addended byDonnajean Lopes: Baldomero Mirarchi M on: 03/12/2014 04:10 PM   Modules accepted: Orders

## 2014-06-15 ENCOUNTER — Other Ambulatory Visit (INDEPENDENT_AMBULATORY_CARE_PROVIDER_SITE_OTHER): Payer: Self-pay | Admitting: *Deleted

## 2014-06-15 DIAGNOSIS — Z5181 Encounter for therapeutic drug level monitoring: Secondary | ICD-10-CM

## 2014-06-15 DIAGNOSIS — Z7901 Long term (current) use of anticoagulants: Secondary | ICD-10-CM

## 2014-06-15 LAB — PROTIME-INR
INR: 4.78 — AB (ref <1.50)
Prothrombin Time: 44.8 seconds — ABNORMAL HIGH (ref 11.6–15.2)

## 2014-06-15 NOTE — Progress Notes (Signed)
Pt here for lab draw only  

## 2014-06-18 ENCOUNTER — Telehealth: Payer: Self-pay

## 2014-06-18 MED ORDER — WARFARIN SODIUM 7.5 MG PO TABS: ORAL_TABLET | ORAL | Status: DC

## 2014-06-18 NOTE — Telephone Encounter (Signed)
INR is now too HIGH.  What dose is he currently taking? (It should be 7.5 mg each day). Has he changed anything in his diet? Any new medications?  If no to all of the above, we'll keep him on the 7.5 mg tablets, but ask that he take 1/2 tablet tonight, then 7.5 mg tomorrow night and alternate like that for 1 week, then repeat the PT/INR.

## 2014-06-18 NOTE — Telephone Encounter (Signed)
Spoke with pt, he states he quit smoking and he eats a clove of garlic eveyday because he read it was a natural blood thinner. He state she is out of medication. Can we refill?

## 2014-06-18 NOTE — Telephone Encounter (Signed)
FYI. The PT/INR results came to my inbox and I forwarded to Dr Clelia CroftShaw as she is really the physician who is monitoring his coumadin/INR. Please forward back to the PA pool, I just wanted them to be aware of that. Thanks.

## 2014-06-18 NOTE — Telephone Encounter (Signed)
Spoke with pt, advised instructions. Pt understood.

## 2014-06-18 NOTE — Telephone Encounter (Signed)
Refilled, but please give him these NEW instructions: Take 1/2 tablet tonight, 1 tablet tomorrow, and alternate like that. Repeat labs in 1 week.

## 2014-06-18 NOTE — Telephone Encounter (Signed)
Pt had PT INR done on Fri. Can someone please review. He needs a RF on Coumadin. Thanks

## 2014-06-18 NOTE — Telephone Encounter (Signed)
Noted  

## 2014-06-29 ENCOUNTER — Emergency Department
Admission: EM | Admit: 2014-06-29 | Discharge: 2014-06-30 | Discharge status: Against Medical Advice | Payer: Self-pay | Attending: Emergency Medicine | Admitting: Emergency Medicine

## 2014-06-29 DIAGNOSIS — T1592XA Foreign body on external eye, part unspecified, left eye, initial encounter: Secondary | ICD-10-CM | POA: Insufficient documentation

## 2014-06-29 DIAGNOSIS — X58XXXA Exposure to other specified factors, initial encounter: Secondary | ICD-10-CM | POA: Insufficient documentation

## 2014-06-29 DIAGNOSIS — Y998 Other external cause status: Secondary | ICD-10-CM | POA: Insufficient documentation

## 2014-06-29 DIAGNOSIS — Y9289 Other specified places as the place of occurrence of the external cause: Secondary | ICD-10-CM | POA: Insufficient documentation

## 2014-06-29 DIAGNOSIS — Y9389 Activity, other specified: Secondary | ICD-10-CM | POA: Insufficient documentation

## 2014-06-29 DIAGNOSIS — Z720 Tobacco use: Secondary | ICD-10-CM | POA: Insufficient documentation

## 2014-06-30 NOTE — ED Notes (Signed)
Pt presents to ER alert and in NAD. Pt states he has a piece of metal in his left eye. Pt denies blurred vision.

## 2014-10-05 ENCOUNTER — Other Ambulatory Visit: Payer: Self-pay

## 2014-10-05 DIAGNOSIS — D689 Coagulation defect, unspecified: Secondary | ICD-10-CM

## 2014-10-05 DIAGNOSIS — Z7901 Long term (current) use of anticoagulants: Secondary | ICD-10-CM

## 2014-10-05 MED ORDER — WARFARIN SODIUM 7.5 MG PO TABS: ORAL_TABLET | ORAL | Status: DC

## 2014-10-05 NOTE — Telephone Encounter (Signed)
So...He was advised to return after a week of Chelle Jeffery refill, 06/15/2014, which was never done.    Contacted patient: He has been 2 weeks without the warfarin.  states he was working out of town and was not able to return for refill.  Continues to take garlic.    He needs to come after 1 week.  I am refilling to 2 weeks.  He was advised that he needs to have a visit within that 1 week where his PT/INR, hematocrit, etc. Will be done.

## 2014-10-05 NOTE — Telephone Encounter (Signed)
Advise on refill. 

## 2014-10-05 NOTE — Telephone Encounter (Signed)
Refill on Warfarin  [phone]

## 2014-10-10 ENCOUNTER — Ambulatory Visit (INDEPENDENT_AMBULATORY_CARE_PROVIDER_SITE_OTHER): Payer: Self-pay | Admitting: Family Medicine

## 2014-10-10 VITALS — BP 118/60 | HR 54 | Temp 97.7°F | Resp 16 | Ht 72.0 in | Wt 176.0 lb

## 2014-10-10 DIAGNOSIS — Z5181 Encounter for therapeutic drug level monitoring: Secondary | ICD-10-CM

## 2014-10-10 DIAGNOSIS — Z7901 Long term (current) use of anticoagulants: Secondary | ICD-10-CM

## 2014-10-10 NOTE — Patient Instructions (Signed)
Please await contact for the results of labwork. We will contact you with whether you will return for a future lab draw day 3, 4, or 5. This is the dosage of your coumadin.  Take 1/2 tablet tonight, 1 tablet tomorrow, and alternate like that. Repeat labs in 1 week.

## 2014-10-11 LAB — PROTIME-INR
INR: 2.48 — ABNORMAL HIGH (ref <1.50)
Prothrombin Time: 27.2 seconds — ABNORMAL HIGH (ref 11.6–15.2)

## 2014-10-13 ENCOUNTER — Telehealth: Payer: Self-pay | Admitting: Physician Assistant

## 2014-10-13 NOTE — Progress Notes (Signed)
[facility] [address], [place] [address] [phone]  Date:  10/10/2014   Name:  Craig Hahn   DOB:  08/09/1960   MRN:  [id number]  PCP:  No PCP Per Patient    History of Present Illness:  Craig Hahn is a 54 y.o. male patient who presents to [facility] for coumadin management, medication refill of the coumadin, and right leg pain.  He is not a good historian.  Patient was given 2 weeks of coumadin and advised to return in 1 wk for refill, and prothrombin/inr.  Patient states that he has been taking the coumadin but took 15mg  last night.  He can not accurately give me the amount of coumadin he has been taking over the last week.  He has also added to his intake of tumeric, ginger, garlic, omega 3 (all supplements), as a homeopathic blood thinner.  He was without coumadin for about 1 month.   1 week ago he reports that while getting off of a trampoline (just laying), he stepped down at his right knee and felt a sharp pain at his right inner shin.  He noticed increased swelling in this area initially.  Over time, the pain has improved locally but he has noticed that the pain has travelled toward his right groin.  He has no increased swelling at his right groin, but has noticed warmth.  It hurts with ambulation and so he has been keeping the leg elevated for the last several days.  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  He denies cough or fever.  There is no actual calf pain that ever occurred.  No sob or dyspnea.    To note in hx, patient states that he adamantly will not go get an [place], xray, or any imaging modality at this time for lack of insurance, and cost.  Patient Active Problem List   Diagnosis Date Noted  . DVT (deep venous thrombosis) 11/23/2013    Past Medical History  Diagnosis Date  . Anemia   . Depression   . Clotting disorder     Unsure if has ever been worked up, per pet  has had 9 clots in the past, last was in 2006, both brothers also have clotting issues    Past Surgical History  Procedure Laterality Date  . Dental implants      Social History  Substance Use Topics  . Smoking status: Current Every Day Smoker -- 0.00 packs/day    Types: E-cigarettes  . Smokeless tobacco: None  . Alcohol Use: Yes    Family History  Problem Relation Age of Onset  . Clotting disorder Brother   . Clotting disorder Brother     Allergies  Allergen Reactions  . Codeine   . Penicillins   . Sulfa Antibiotics     Medication list has been reviewed and updated.  Current Outpatient Prescriptions on File Prior to Visit  Medication Sig Dispense Refill  . warfarin (COUMADIN) 7.5 MG tablet Take as directed by physician. 21 tablet 0   No current facility-administered medications on file prior to visit.    ROS ROS otherwise unremarkable unless listed above.   Physical Examination: BP 118/60 mmHg  Pulse 54  Temp(Src) 97.7 F (36.5 C) (Oral)  Resp 16  Ht 6' (1.829 m)  Wt 176 lb (79.833 kg)  BMI 23.86 kg/m2  SpO2  98% Ideal Body Weight: Weight in (lb) to have BMI = 25: 183.9  Physical Exam  Constitutional: He is oriented to person, place, and time. He appears well-developed and well-nourished. No distress.  HENT:  Head: Normocephalic and atraumatic.  Eyes: Conjunctivae and EOM are normal. Pupils are equal, round, and reactive to light.  Cardiovascular: Normal rate, regular rhythm and intact distal pulses.   Pulmonary/Chest: Effort normal and breath sounds normal. No respiratory distress. He has no wheezes. He has no rales.  Musculoskeletal:  No erythema or swelling to note upon visualization.  Tender along medial lower leg.  There are varicosities present on right leg, as well as left.  No warmth present along the extremity or groin.  Mildly tender upon palpation.  Normal rom throughout right extremity.  Pain incited with plantar flexon.  Pain incited with  forward flexion, and left lateral deviation.  Negative straight leg raise.      Neurological: He is alert and oriented to person, place, and time.  Skin: Skin is warm and dry. He is not diaphoretic.  Psychiatric: He has a normal mood and affect. His behavior is normal.   Results for orders placed or performed in visit on 10/10/14  Protime-INR  Result Value Ref Range   Prothrombin Time 27.2 (H) 11.6 - 15.2 seconds   INR 2.48 (H) <1.50     Assessment and Plan: 54 year old male with pmh listed above is here today for coumadin management and right leg pain.  Likely muscle injury due to movement>clot.  Patient declines ultrasound at this time, despite advisement.  I have advised him of alarming signs to warrant ED return.  He will return in 5 days, for follow up.  I have included his appropriate medication intake 1/2 tablet today, 1 tablet tomorrow, and continue, verbally and in avs.  He will have to be managed without refills likely to be able to monitor this with some compliance.  INR therapeutic at this time.  1. Anticoagulated on Coumadin - Protime-INR   Trena Platt, PA-C [facility] 10/13/2014 8:29 AM

## 2014-10-14 NOTE — Progress Notes (Signed)
History and physical examinations obtained with PA English. Agree with assessment and plan. Tyliek Timberman Martin Roddrick Sharron, M.D. [facility]  [place] [address] [place], [place]  [address] [phone] phone [phone] fax  

## 2014-10-15 ENCOUNTER — Other Ambulatory Visit: Payer: Self-pay

## 2014-10-15 ENCOUNTER — Other Ambulatory Visit (INDEPENDENT_AMBULATORY_CARE_PROVIDER_SITE_OTHER): Payer: Self-pay

## 2014-10-15 DIAGNOSIS — Z5181 Encounter for therapeutic drug level monitoring: Secondary | ICD-10-CM

## 2014-10-15 DIAGNOSIS — Z7901 Long term (current) use of anticoagulants: Secondary | ICD-10-CM

## 2014-10-15 LAB — PROTIME-INR
INR: 3.83 — AB (ref <1.50)
Prothrombin Time: 38.3 seconds — ABNORMAL HIGH (ref 11.6–15.2)

## 2014-10-15 MED ORDER — WARFARIN SODIUM 7.5 MG PO TABS: ORAL_TABLET | ORAL | Status: DC

## 2014-10-15 NOTE — Telephone Encounter (Signed)
error 

## 2014-10-17 ENCOUNTER — Other Ambulatory Visit: Payer: Self-pay | Admitting: Family Medicine

## 2014-10-17 DIAGNOSIS — I82403 Acute embolism and thrombosis of unspecified deep veins of lower extremity, bilateral: Secondary | ICD-10-CM

## 2014-10-18 ENCOUNTER — Encounter: Payer: Self-pay | Admitting: Family Medicine

## 2015-01-17 ENCOUNTER — Other Ambulatory Visit: Payer: Self-pay | Admitting: Family Medicine

## 2015-01-21 ENCOUNTER — Telehealth: Payer: Self-pay

## 2015-01-21 ENCOUNTER — Other Ambulatory Visit (INDEPENDENT_AMBULATORY_CARE_PROVIDER_SITE_OTHER): Payer: Self-pay | Admitting: Family Medicine

## 2015-01-21 DIAGNOSIS — I82403 Acute embolism and thrombosis of unspecified deep veins of lower extremity, bilateral: Secondary | ICD-10-CM

## 2015-01-21 DIAGNOSIS — Z7901 Long term (current) use of anticoagulants: Secondary | ICD-10-CM

## 2015-01-21 NOTE — Progress Notes (Signed)
Patient here today for lab draw only. 

## 2015-01-21 NOTE — Telephone Encounter (Signed)
Patient states that he only has one day of his warfarin (COUMADIN) 7.5 MG tablet he would like for someone to call this in for him. Thank you!

## 2015-01-22 LAB — PROTIME-INR
INR: 2.76 — ABNORMAL HIGH (ref <1.50)
PROTHROMBIN TIME: 29.6 s — AB (ref 11.6–15.2)

## 2015-01-22 MED ORDER — WARFARIN SODIUM 7.5 MG PO TABS: ORAL_TABLET | ORAL | Status: DC

## 2015-01-22 NOTE — Telephone Encounter (Signed)
Ok to send in.  

## 2015-01-22 NOTE — Telephone Encounter (Signed)
Ok to send in.  Refilled today for 30 days.  He needs to return for recheck of inr/pt.  Please advise patient.

## 2015-01-23 NOTE — Telephone Encounter (Signed)
Left message advising pt. 

## 2015-02-04 ENCOUNTER — Ambulatory Visit (INDEPENDENT_AMBULATORY_CARE_PROVIDER_SITE_OTHER): Payer: Self-pay | Admitting: Urgent Care

## 2015-02-04 VITALS — BP 138/74 | HR 85 | Temp 97.5°F | Resp 14 | Ht 72.0 in | Wt 185.4 lb

## 2015-02-04 DIAGNOSIS — M79604 Pain in right leg: Secondary | ICD-10-CM

## 2015-02-04 DIAGNOSIS — Z5181 Encounter for therapeutic drug level monitoring: Secondary | ICD-10-CM

## 2015-02-04 DIAGNOSIS — M79651 Pain in right thigh: Secondary | ICD-10-CM

## 2015-02-04 DIAGNOSIS — Z7901 Long term (current) use of anticoagulants: Secondary | ICD-10-CM

## 2015-02-04 DIAGNOSIS — D689 Coagulation defect, unspecified: Secondary | ICD-10-CM

## 2015-02-04 NOTE — Progress Notes (Signed)
    MRN: [id number] DOB: 1960-08-29  Subjective:   Lenice Llamasaul S Greenly is a 55 y.o. male with pmh of dvt, coumadin therapy presenting for chief complaint of Leg Pain and Groin Pain  Reports ~2 week history of right calf, ankle, feet, groin pain. Patient reports that his pain is worst over his right foot and groin. He reports increased pain with walking, restless legs at night, burning sensation in his thighs and legs, relieved with getting up and walking around. Patient has a lot of financial burden and reports a history of dvt, managed with Coumadin with PT and INR monitoring at our clinic. He states that his last DVT feels the same as this episode. He would like an ultrasound. Patient is still working on quitting smoking, he is down to 2 cigarettes per day. He has also cut back significantly on his alcohol. Drinks 1-2 drinks per day.  Renae Fickleaul has a current medication list which includes the following prescription(s): warfarin. Also is allergic to codeine; penicillins; and sulfa antibiotics.  Renae Fickleaul  has a past medical history of Anemia; Depression; and Clotting disorder (HCC). Also  has past surgical history that includes dental implants.  Objective:   Vitals: BP 138/74 mmHg  Pulse 85  Temp(Src) 97.5 F (36.4 C) (Oral)  Resp 14  Ht 6' (1.829 m)  Wt 185 lb 6.4 oz (84.097 kg)  BMI 25.14 kg/m2  SpO2 95%  Physical Exam  Constitutional: He is oriented to person, place, and time. He appears well-developed and well-nourished.  Neck:    Cardiovascular: Normal rate, regular rhythm and intact distal pulses.  Exam reveals no gallop and no friction rub.   No murmur heard. Pulmonary/Chest: No respiratory distress. He has no wheezes. He has no rales.  Musculoskeletal:       Legs: Lymphadenopathy:    He has no cervical adenopathy.  Neurological: He is alert and oriented to person, place, and time.  Skin: Skin is warm and dry. No rash noted. No erythema. No pallor.   Assessment and Plan :   This case  was precepted with Dr. Ellamae Siaobert Doolittle.  1. Right thigh pain 2. Pain of right lower extremity 3. Anticoagulated on Coumadin 4. Clotting disorder (HCC) - Will order ultrasound to rule out DVT to be completed within 2 days. - Discussed diagnosis of peripheral arterial disease and lipodermatosclerosis changes in his skin. - Encouraged continued efforts at smoking cessation. Counseled on alcohol consumption while on Coumadin.  Wallis BambergMario Dub Maclellan, PA-C [facility] [phone] 02/04/2015 6:11 PM

## 2015-02-04 NOTE — Patient Instructions (Signed)
Deep Vein Thrombosis °A deep vein thrombosis (DVT) is a blood clot (thrombus) that usually occurs in a deep, larger vein of the lower leg or the pelvis, or in an upper extremity such as the arm. These are dangerous and can lead to serious and even life-threatening complications if the clot travels to the lungs. °A DVT can damage the valves in your leg veins so that instead of flowing upward, the blood pools in the lower leg. This is called post-thrombotic syndrome, and it can result in pain, swelling, discoloration, and sores on the leg. °CAUSES °A DVT is caused by the formation of a blood clot in your leg, pelvis, or arm. Usually, several things contribute to the formation of blood clots. A clot may develop when: °· Your blood flow slows down. °· Your vein becomes damaged in some way. °· You have a condition that makes your blood clot more easily. °RISK FACTORS °A DVT is more likely to develop in: °· People who are older, especially over 60 years of age. °· People who are overweight (obese). °· People who sit or lie still for a long time, such as during long-distance travel (over 4 hours), bed rest, hospitalization, or during recovery from certain medical conditions like a stroke. °· People who do not engage in much physical activity (sedentary lifestyle). °· People who have chronic breathing disorders. °· People who have a personal or family history of blood clots or blood clotting disease. °· People who have peripheral vascular disease (PVD), diabetes, or some types of cancer. °· People who have heart disease, especially if the person had a recent heart attack or has congestive heart failure. °· People who have neurological diseases that affect the legs (leg paresis). °· People who have had a traumatic injury, such as breaking a hip or leg. °· People who have recently had major or lengthy surgery, especially on the hip, knee, or abdomen. °· People who have had a central line placed inside a large vein. °· People  who take medicines that contain the hormone estrogen. These include birth control pills and hormone replacement therapy. °· Pregnancy or during childbirth or the postpartum period. °· Long plane flights (over 8 hours). °SIGNS AND SYMPTOMS °Symptoms of a DVT can include:  °· Swelling of your leg or arm, especially if one side is much worse. °· Warmth and redness of your leg or arm, especially if one side is much worse. °· Pain in your arm or leg. If the clot is in your leg, symptoms may be more noticeable or worse when you stand or walk. °· A feeling of pins and needles, if the clot is in the arm. °The symptoms of a DVT that has traveled to the lungs (pulmonary embolism, PE) usually start suddenly and include: °· Shortness of breath while active or at rest. °· Coughing or coughing up blood or blood-tinged mucus. °· Chest pain that is often worse with deep breaths. °· Rapid or irregular heartbeat. °· Feeling light-headed or dizzy. °· Fainting. °· Feeling anxious. °· Sweating. °There may also be pain and swelling in a leg if that is where the blood clot started. °These symptoms may represent a serious problem that is an emergency. Do not wait to see if the symptoms will go away. Get medical help right away. Call your local emergency services (911 in the [place] Do not drive yourself to the hospital. °DIAGNOSIS °Your health care provider will take a medical history and perform a physical exam. You may also   have other tests, including: °· Blood tests to assess the clotting properties of your blood. °· Imaging tests, such as CT, ultrasound, MRI, X-ray, and other tests to see if you have clots anywhere in your body. °TREATMENT °After a DVT is identified, it can be treated. The type of treatment that you receive depends on many factors, such as the cause of your DVT, your risk for bleeding or developing more clots, and other medical conditions that you have. Sometimes, a combination of treatments is necessary. Treatment  options may be combined and include: °· Monitoring the blood clot with ultrasound. °· Taking medicines by mouth, such as newer blood thinners (anticoagulants), thrombolytics, or warfarin. °· Taking anticoagulant medicine by injection or through an IV tube. °· Wearing compression stockings or using different types of devices. °· Surgery (rare) to remove the blood clot or to place a filter in your abdomen to stop the blood clot from traveling to your lungs. °Treatments for a DVT are often divided into immediate treatment and long-term treatment (up to 3 months after DVT). You can work with your health care provider to choose the treatment program that is best for you. °HOME CARE INSTRUCTIONS °If you are taking a newer oral anticoagulant: °· Take the medicine every single day at the same time each day. °· Understand what foods and drugs interact with this medicine. °· Understand that there are no regular blood tests required when using this medicine. °· Understand the side effects of this medicine, including excessive bruising or bleeding. Ask your health care provider or pharmacist about other possible side effects. °If you are taking warfarin: °· Understand how to take warfarin and know which foods can affect how warfarin works in your body. °· Understand that it is dangerous to take too much or too little warfarin. Too much warfarin increases the risk of bleeding. Too little warfarin continues to allow the risk for blood clots. °· Follow your PT and INR blood testing schedule. The PT and INR results allow your health care provider to adjust your dose of warfarin. It is very important that you have your PT and INR tested as often as told by your health care provider. °· Avoid major changes in your diet, or tell your health care provider before you change your diet. Arrange a visit with a registered dietitian to answer your questions. Many foods, especially foods that are high in vitamin K, can interfere with warfarin  and affect the PT and INR results. Eat a consistent amount of foods that are high in vitamin K, such as: °¨ Spinach, kale, broccoli, cabbage, collard greens, turnip greens, Brussels sprouts, peas, cauliflower, seaweed, and parsley. °¨ Beef liver and pork liver. °¨ Green tea. °¨ Soybean oil. °· Tell your health care provider about any and all medicines, vitamins, and supplements that you take, including aspirin and other over-the-counter anti-inflammatory medicines. Be especially cautious with aspirin and anti-inflammatory medicines. Do not take those before you ask your health care provider if it is safe to do so. This is important because many medicines can interfere with warfarin and affect the PT and INR results. °· Do not start or stop taking any over-the-counter or prescription medicine unless your health care provider or pharmacist tells you to do so. °If you take warfarin, you will also need to do these things: °· Hold pressure over cuts for longer than usual. °· Tell your dentist and other health care providers that you are taking warfarin before you have any procedures in which   bleeding may occur. °· Avoid alcohol or drink very small amounts. Tell your health care provider if you change your alcohol intake. °· Do not use tobacco products, including cigarettes, chewing tobacco, and e-cigarettes. If you need help quitting, ask your health care provider. °· Avoid contact sports. °General Instructions °· Take over-the-counter and prescription medicines only as told by your health care provider. Anticoagulant medicines can have side effects, including easy bruising and difficulty stopping bleeding. If you are prescribed an anticoagulant, you will also need to do these things: °¨ Hold pressure over cuts for longer than usual. °¨ Tell your dentist and other health care providers that you are taking anticoagulants before you have any procedures in which bleeding may occur. °¨ Avoid contact sports. °· Wear a medical  alert bracelet or carry a medical alert card that says you have had a PE. °· Ask your health care provider how soon you can go back to your normal activities. Stay active to prevent new blood clots from forming. °· Make sure to exercise while traveling or when you have been sitting or standing for a long period of time. It is very important to exercise. Exercise your legs by walking or by tightening and relaxing your leg muscles often. Take frequent walks. °· Wear compression stockings as told by your health care provider to help prevent more blood clots from forming. °· Do not use tobacco products, including cigarettes, chewing tobacco, and e-cigarettes. If you need help quitting, ask your health care provider. °· Keep all follow-up appointments with your health care provider. This is important. °PREVENTION °Take these actions to decrease your risk of developing another DVT: °· Exercise regularly. For at least 30 minutes every day, engage in: °¨ Activity that involves moving your arms and legs. °¨ Activity that encourages good blood flow through your body by increasing your heart rate. °· Exercise your arms and legs every hour during long-distance travel (over 4 hours). Drink plenty of water and avoid drinking alcohol while traveling. °· Avoid sitting or lying in bed for long periods of time without moving your legs. °· Maintain a weight that is appropriate for your height. Ask your health care provider what weight is healthy for you. °· If you are a woman who is over 35 years of age, avoid unnecessary use of medicines that contain estrogen. These include birth control pills. °· Do not smoke, especially if you take estrogen medicines. If you need help quitting, ask your health care provider. °If you are hospitalized, prevention measures may include: °· Early walking after surgery, as soon as your health care provider says that it is safe. °· Receiving anticoagulants to prevent blood clots. If you cannot take  anticoagulants, other options may be available, such as wearing compression stockings or using different types of devices. °SEEK IMMEDIATE MEDICAL CARE IF: °· You have new or increased pain, swelling, or redness in an arm or leg. °· You have numbness or tingling in an arm or leg. °· You have shortness of breath while active or at rest. °· You have chest pain. °· You have a rapid or irregular heartbeat. °· You feel light-headed or dizzy. °· You cough up blood. °· You notice blood in your vomit, bowel movement, or urine. °These symptoms may represent a serious problem that is an emergency. Do not wait to see if the symptoms will go away. Get medical help right away. Call your local emergency services (911 in the [place] Do not drive yourself to the hospital. °  °  This information is not intended to replace advice given to you by your health care provider. Make sure you discuss any questions you have with your health care provider. °  °Document Released: 01/19/2005 Document Revised: 10/10/2014 Document Reviewed: 05/16/2014 °Elsevier Interactive Patient Education ©2016 [employer] ° °

## 2015-02-20 ENCOUNTER — Encounter (HOSPITAL_COMMUNITY): Payer: Self-pay | Admitting: Emergency Medicine

## 2015-02-20 ENCOUNTER — Telehealth: Payer: Self-pay

## 2015-02-20 DIAGNOSIS — F17210 Nicotine dependence, cigarettes, uncomplicated: Secondary | ICD-10-CM | POA: Insufficient documentation

## 2015-02-20 DIAGNOSIS — N390 Urinary tract infection, site not specified: Secondary | ICD-10-CM | POA: Insufficient documentation

## 2015-02-20 DIAGNOSIS — Z7901 Long term (current) use of anticoagulants: Secondary | ICD-10-CM | POA: Insufficient documentation

## 2015-02-20 DIAGNOSIS — Z880 Allergy status to penicillin: Secondary | ICD-10-CM | POA: Insufficient documentation

## 2015-02-20 DIAGNOSIS — Z86718 Personal history of other venous thrombosis and embolism: Secondary | ICD-10-CM | POA: Insufficient documentation

## 2015-02-20 DIAGNOSIS — Z862 Personal history of diseases of the blood and blood-forming organs and certain disorders involving the immune mechanism: Secondary | ICD-10-CM | POA: Insufficient documentation

## 2015-02-20 DIAGNOSIS — Z8659 Personal history of other mental and behavioral disorders: Secondary | ICD-10-CM | POA: Insufficient documentation

## 2015-02-20 LAB — CBC
HCT: 47.2 % (ref 39.0–52.0)
Hemoglobin: 16.4 g/dL (ref 13.0–17.0)
MCH: 32.8 pg (ref 26.0–34.0)
MCHC: 34.7 g/dL (ref 30.0–36.0)
MCV: 94.4 fL (ref 78.0–100.0)
Platelets: 183 10*3/uL (ref 150–400)
RBC: 5 MIL/uL (ref 4.22–5.81)
RDW: 13.8 % (ref 11.5–15.5)
WBC: 6.9 10*3/uL (ref 4.0–10.5)

## 2015-02-20 LAB — COMPREHENSIVE METABOLIC PANEL
ALT: 16 U/L — ABNORMAL LOW (ref 17–63)
AST: 22 U/L (ref 15–41)
Albumin: 3.7 g/dL (ref 3.5–5.0)
Alkaline Phosphatase: 55 U/L (ref 38–126)
Anion gap: 8 (ref 5–15)
BUN: 9 mg/dL (ref 6–20)
CO2: 27 mmol/L (ref 22–32)
Calcium: 9.3 mg/dL (ref 8.9–10.3)
Chloride: 105 mmol/L (ref 101–111)
Creatinine, Ser: 0.93 mg/dL (ref 0.61–1.24)
GFR calc Af Amer: >60 mL/min (ref >60)
GFR calc non Af Amer: >60 mL/min (ref >60)
Glucose, Bld: 128 mg/dL — ABNORMAL HIGH (ref 65–99)
Potassium: 3.8 mmol/L (ref 3.5–5.1)
Sodium: 140 mmol/L (ref 135–145)
Total Bilirubin: 0.6 mg/dL (ref 0.3–1.2)
Total Protein: 7.2 g/dL (ref 6.5–8.1)

## 2015-02-20 LAB — URINE MICROSCOPIC-ADD ON

## 2015-02-20 LAB — PROTIME-INR
INR: 1.57 — ABNORMAL HIGH (ref 0.00–1.49)
Prothrombin Time: 18.8 seconds — ABNORMAL HIGH (ref 11.6–15.2)

## 2015-02-20 LAB — URINALYSIS, ROUTINE W REFLEX MICROSCOPIC
Bilirubin Urine: NEGATIVE
Glucose, UA: NEGATIVE mg/dL
Ketones, ur: NEGATIVE mg/dL
Nitrite: NEGATIVE
Protein, ur: NEGATIVE mg/dL
Specific Gravity, Urine: 1.014 (ref 1.005–1.030)
pH: 5 (ref 5.0–8.0)

## 2015-02-20 MED ORDER — WARFARIN SODIUM 7.5 MG PO TABS: ORAL_TABLET | ORAL | Status: DC

## 2015-02-20 NOTE — Telephone Encounter (Signed)
Patient to calling to request a refill for coumadin. He also needs advising because he has pain in his penis and blood in his urine. [phone]

## 2015-02-20 NOTE — Telephone Encounter (Signed)
Pt advised.

## 2015-02-20 NOTE — ED Notes (Signed)
Pt reports pain and burning with urination for the last several months. States yesterday noted bright red blood in urine as well as this morning. Pt reports taking blood thinners for blood clots in his legs. Pt ambulatory, NAD at present,VSS.

## 2015-02-20 NOTE — Telephone Encounter (Signed)
Spoke with pt, advised to go to the ER for evaluation for blood and pain in penis. Advised Rx at the pharmacy.

## 2015-02-20 NOTE — Telephone Encounter (Signed)
This was assigned to me.  Looks like you saw him last.

## 2015-02-20 NOTE — Telephone Encounter (Signed)
This can go to the PA-Pool. He is not my patient, I have only seen him once. Either way, it appears that some one agreed to and told this patient some time ago that we could manage his coumadin here. He is self pay and probably worked this is out with another provider. Since then, he has come in to see multiple providers to have PT/INR, coumadin refills. I will do this once since I saw him last but in the future this should go to the PA Pool or to the provider that originally agreed to manage his coumadin.

## 2015-02-21 ENCOUNTER — Emergency Department (HOSPITAL_COMMUNITY)
Admission: EM | Admit: 2015-02-21 | Discharge: 2015-02-21 | Disposition: A | Discharge status: Home / Self Care | Payer: Self-pay | Attending: Emergency Medicine | Admitting: Emergency Medicine

## 2015-02-21 ENCOUNTER — Emergency Department (HOSPITAL_COMMUNITY): Payer: Self-pay

## 2015-02-21 DIAGNOSIS — R319 Hematuria, unspecified: Secondary | ICD-10-CM

## 2015-02-21 DIAGNOSIS — N390 Urinary tract infection, site not specified: Secondary | ICD-10-CM

## 2015-02-21 LAB — URINE CULTURE: CULTURE: NO GROWTH

## 2015-02-21 IMAGING — CT CT RENAL STONE PROTOCOL
2 of 4 series · 10 of 46 positions shown, 11 images · non-contrast
Comparison: Lumbar spine radiographs performed [DATE]

CLINICAL DATA: Acute onset of gross hematuria and left flank pain.
Initial encounter.

EXAM:
CT ABDOMEN AND PELVIS WITHOUT CONTRAST
TECHNIQUE: Multidetector CT imaging of the abdomen and pelvis was performed
following the standard protocol without IV contrast.

[Series 201: stone study, idose (2) · axial · 0.87mm/px · z∈[+56,+456]mm · 7 of 98 slices shown, 8 images]
[im 9/98  soft-tissue]
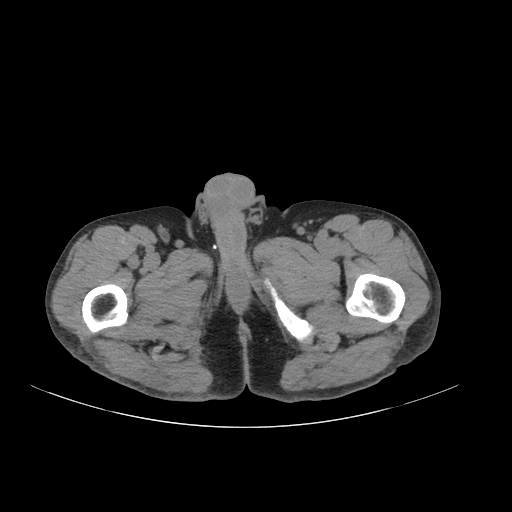
[im 9/98  bone]
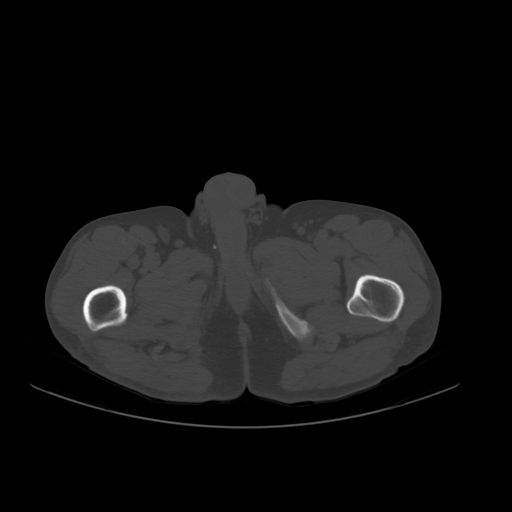
[im 22/98  soft-tissue]
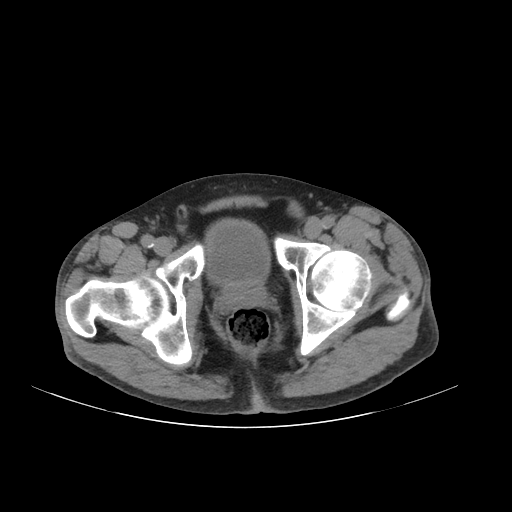
[im 34/98  soft-tissue]
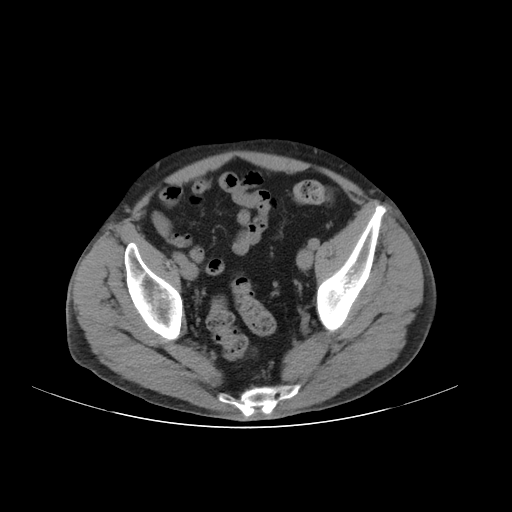
[im 51/98  soft-tissue]
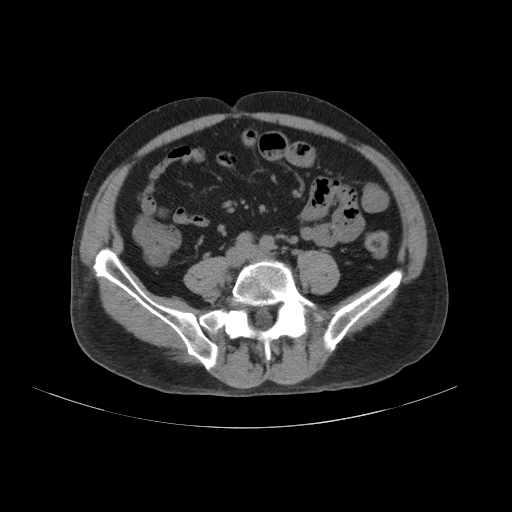
[im 64/98  soft-tissue]
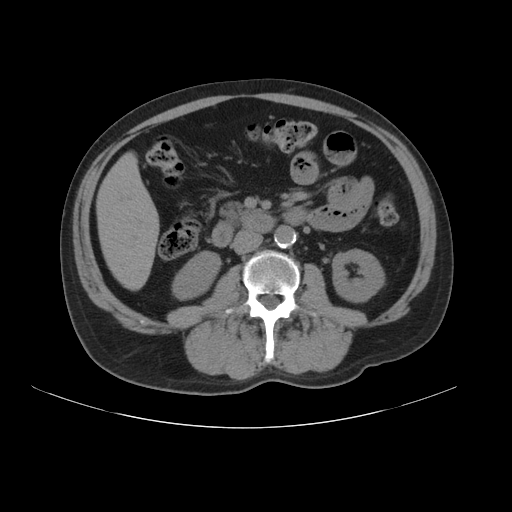
[im 76/98  soft-tissue]
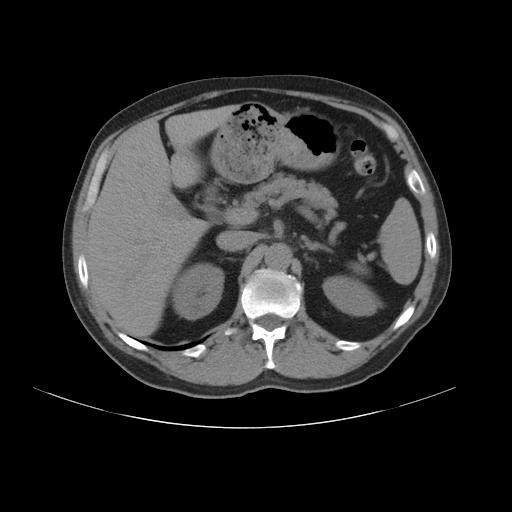
[im 89/98  soft-tissue]
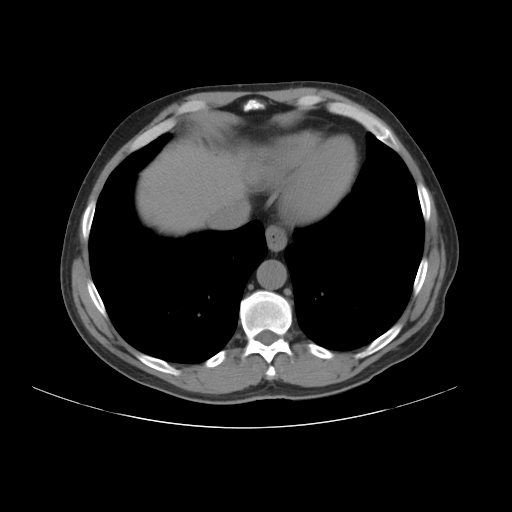

[Series 203: coronals, idose (2) · coronal · 0.45mm/px · 3 of 122 slices shown]
[im 41/122  soft-tissue]
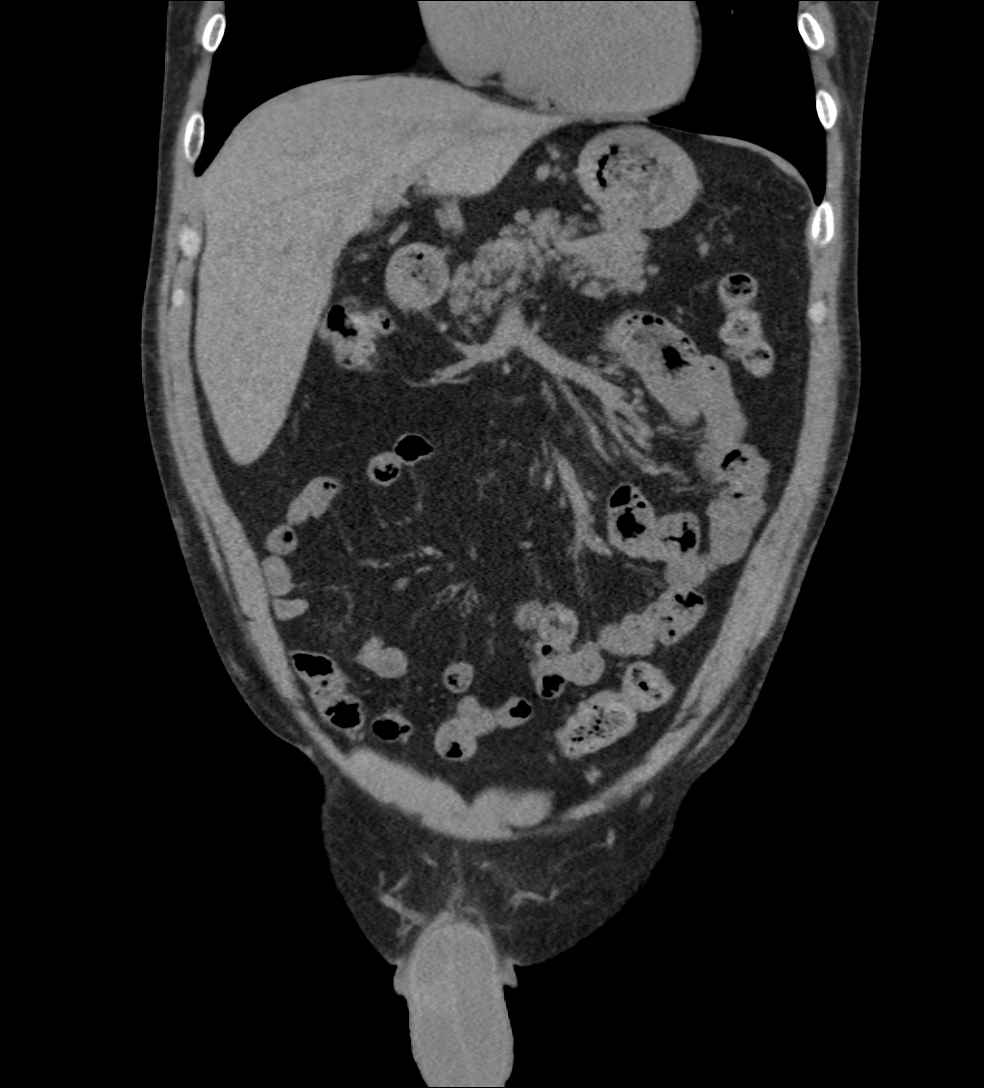
[im 54/122  soft-tissue]
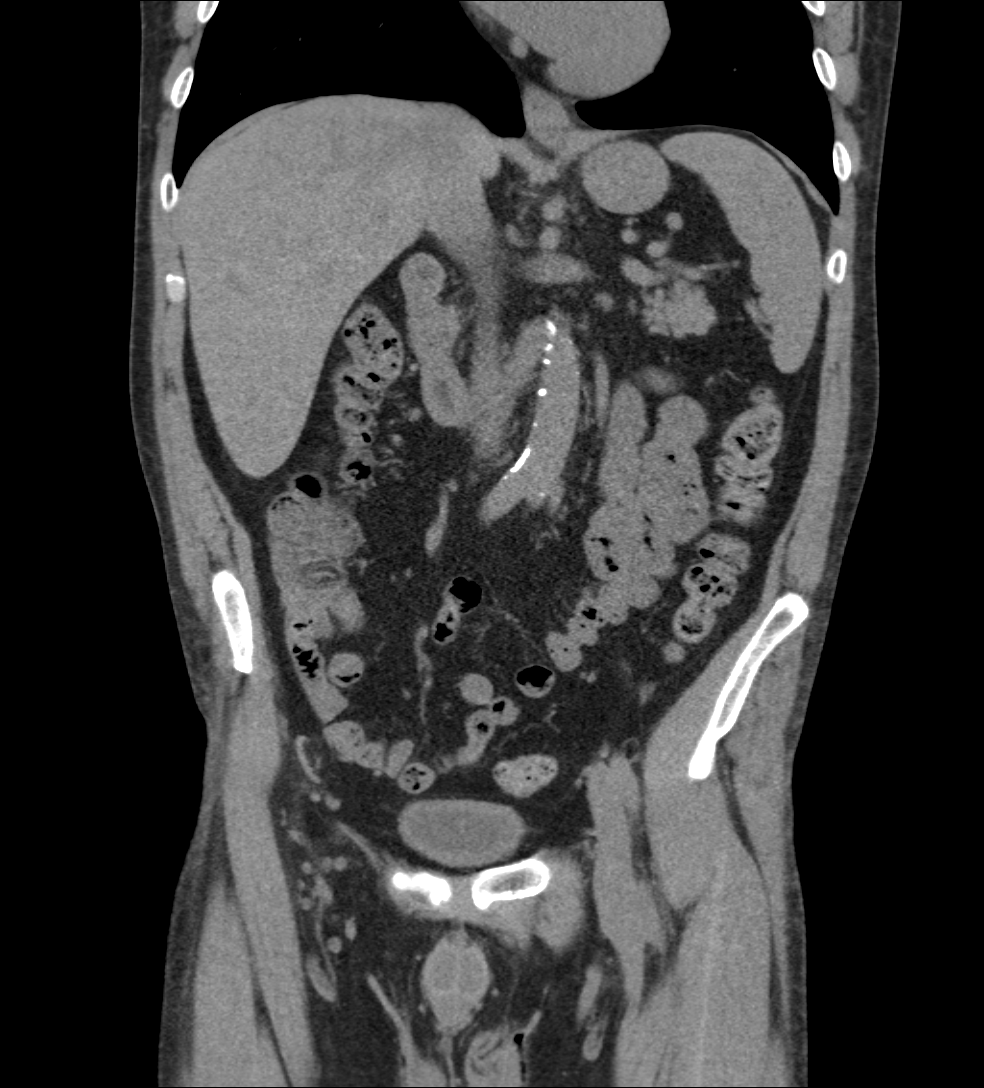
[im 68/122  soft-tissue]
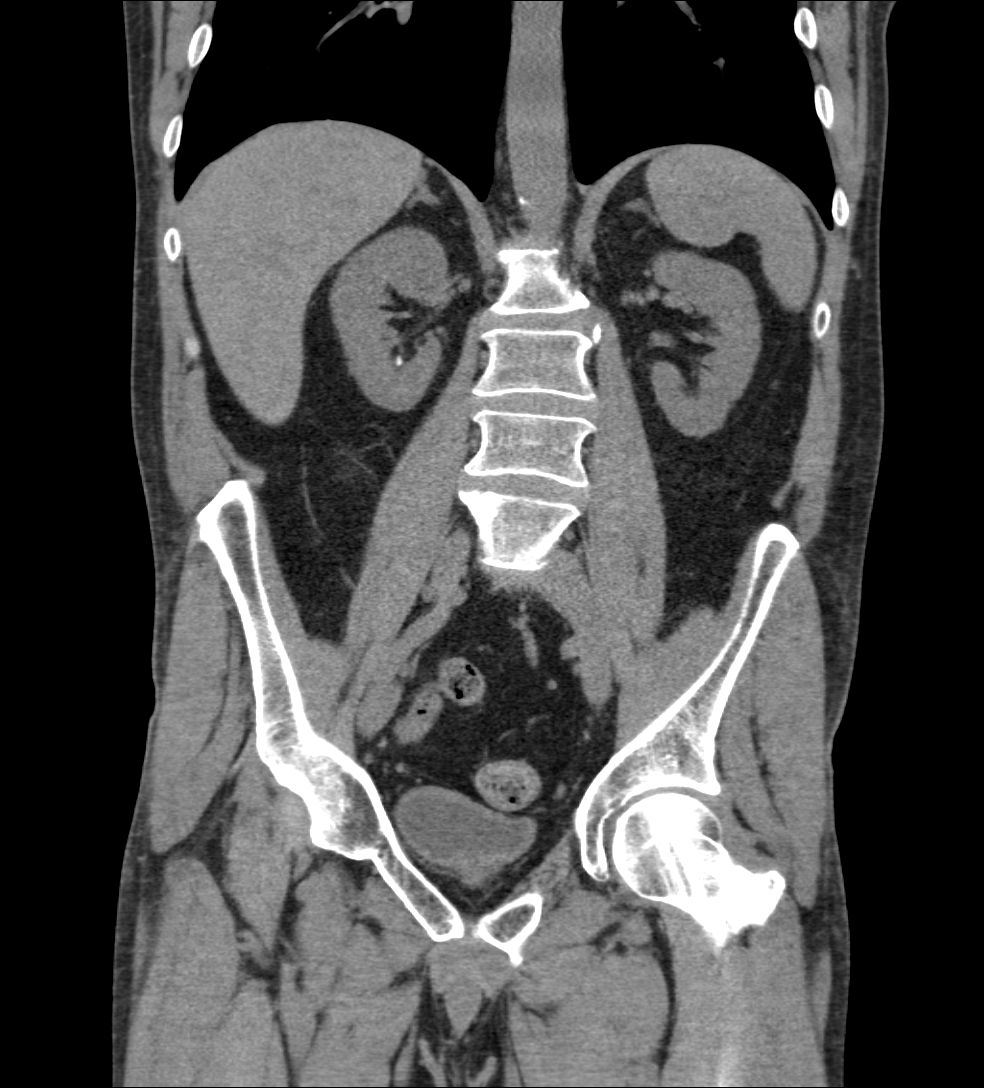

[10 of 46 positions shown; findings below may reference images not displayed]

FINDINGS: The visualized lung bases are clear.

The liver and spleen are unremarkable in appearance. The gallbladder
is within normal limits. The pancreas and adrenal glands are
unremarkable.

Two small stones are noted within the right kidney, the larger of
which measures 3 mm at the lower pole. Mild nonspecific left-sided
perinephric stranding is noted. There is no evidence of
hydronephrosis. No obstructing ureteral stones are seen.

No free fluid is identified. The small bowel is unremarkable in
appearance. The stomach is within normal limits. No acute vascular
abnormalities are seen. Mild scattered calcification is noted along
the abdominal aorta.

The appendix is normal in caliber, without evidence of appendicitis.
The colon is grossly unremarkable in appearance.

The bladder is mildly distended and grossly unremarkable. The
prostate remains normal in size. No inguinal lymphadenopathy is
seen.

No acute osseous abnormalities are identified.
IMPRESSION: 1. No acute abnormality seen to explain the patient's symptoms.
2. Small nonobstructing right renal stones measure up to 3 mm in
size.
3. Mild scattered calcification along the abdominal aorta.

## 2015-02-21 MED ORDER — CEPHALEXIN 500 MG PO CAPS: 500.0000 mg | ORAL_CAPSULE | Freq: Four times a day (QID) | ORAL | Status: DC

## 2015-02-21 MED ORDER — LIDOCAINE HCL (PF) 1 % IJ SOLN
INTRAMUSCULAR | Status: AC
  Administered 2015-02-21: 5 mL
  Filled 2015-02-21: qty 5

## 2015-02-21 MED ORDER — CEFTRIAXONE SODIUM 1 G IJ SOLR
1.0000 g | Freq: Once | INTRAMUSCULAR | Status: AC
  Administered 2015-02-21: 1 g via INTRAMUSCULAR
  Filled 2015-02-21: qty 10

## 2015-02-21 NOTE — ED Provider Notes (Signed)
CSN: [id number]     Arrival date & time 02/20/15  1850 History   By signing my name below, I, Marisue Humble, attest that this documentation has been prepared under the direction and in the presence of Gilda Crease, MD . Electronically Signed: Marisue Humble, Scribe. 02/21/2015. 1:39 AM.   Chief Complaint  Patient presents with  . Dysuria    The history is provided by the patient. No language interpreter was used.   HPI Comments:  Craig Hahn is a 55 y.o. male with Hx of blood clots who presents to the Emergency Department complaining of dysuria for the past few months and 2 episodes of hematuria that began the evening of 02/19/15. Pt has been on coumadin for 14 years after being diagnosed with a blood clot. Pt reports associated moderate lower back pain for the past four months. He states he had an ultrasound scheduled for a possible blood clot, but pain resolved and the [place] was not performed. No alleviating factors noted.  Past Medical History  Diagnosis Date  . Anemia   . Depression   . Clotting disorder (HCC)     Unsure if has ever been worked up, per pet has had 9 clots in the past, last was in 2006, both brothers also have clotting issues   Past Surgical History  Procedure Laterality Date  . Dental implants     Family History  Problem Relation Age of Onset  . Clotting disorder Brother   . Clotting disorder Brother    Social History  Substance Use Topics  . Smoking status: Current Every Day Smoker -- 0.00 packs/day    Types: E-cigarettes  . Smokeless tobacco: None  . Alcohol Use: Yes    Review of Systems  Genitourinary: Positive for dysuria and hematuria.  Musculoskeletal: Positive for back pain (low back).  All other systems reviewed and are negative.  Allergies  Codeine; Penicillins; and Sulfa antibiotics  Home Medications   Prior to Admission medications   Medication Sig Start Date End Date Taking? Authorizing Provider  cephALEXin (KEFLEX) 500 MG  capsule Take 1 capsule (500 mg total) by mouth 4 (four) times daily. 02/21/15   Gilda Crease, MD  warfarin (COUMADIN) 7.5 MG tablet Take as directed by physician. 02/20/15   Wallis Bamberg, PA-C   BP 135/77 mmHg  Pulse 47  Temp(Src) 97.7 F (36.5 C) (Oral)  Resp 18  SpO2 94% Physical Exam  Constitutional: He is oriented to person, place, and time. He appears well-developed and well-nourished. No distress.  HENT:  Head: Normocephalic and atraumatic.  Right Ear: Hearing normal.  Left Ear: Hearing normal.  Nose: Nose normal.  Mouth/Throat: Oropharynx is clear and moist and mucous membranes are normal.  Eyes: Conjunctivae and EOM are normal. Pupils are equal, round, and reactive to light.  Neck: Normal range of motion. Neck supple.  Cardiovascular: Regular rhythm, S1 normal and S2 normal.  Exam reveals no gallop and no friction rub.   No murmur heard. Pulmonary/Chest: Effort normal and breath sounds normal. No respiratory distress. He exhibits no tenderness.  Abdominal: Soft. Normal appearance and bowel sounds are normal. There is no hepatosplenomegaly. There is no tenderness. There is no rebound, no guarding, no tenderness at McBurney's point and negative Murphy's sign. No hernia.  Musculoskeletal: Normal range of motion.  Neurological: He is alert and oriented to person, place, and time. He has normal strength. No cranial nerve deficit or sensory deficit. Coordination normal. GCS eye subscore is 4. GCS verbal  subscore is 5. GCS motor subscore is 6.  Skin: Skin is warm, dry and intact. No rash noted. No cyanosis.  Psychiatric: He has a normal mood and affect. His speech is normal and behavior is normal. Thought content normal.  Nursing note and vitals reviewed.  ED Course  Procedures   DIAGNOSTIC STUDIES:  Oxygen Saturation is 97% on room air, normal by my interpretation.    COORDINATION OF CARE:  12:38 AM Ordered CT to look for stones and antibiotics. Discussed treatment plan  with pt at bedside and pt agreed to plan.  Labs Review Labs Reviewed  COMPREHENSIVE METABOLIC PANEL - Abnormal; Notable for the following:    Glucose, Bld 128 (*)    ALT 16 (*)    All other components within normal limits  URINALYSIS, ROUTINE W REFLEX MICROSCOPIC (NOT AT [facility]) - Abnormal; Notable for the following:    Hgb urine dipstick SMALL (*)    Leukocytes, UA SMALL (*)    All other components within normal limits  PROTIME-INR - Abnormal; Notable for the following:    Prothrombin Time 18.8 (*)    INR 1.57 (*)    All other components within normal limits  URINE MICROSCOPIC-ADD ON - Abnormal; Notable for the following:    Squamous Epithelial / LPF 0-5 (*)    Bacteria, UA FEW (*)    All other components within normal limits  URINE CULTURE  CBC   Imaging Review Ct Renal Stone Study  02/21/2015  CLINICAL DATA:  Acute onset of gross hematuria and left flank pain. Initial encounter. EXAM: CT ABDOMEN AND PELVIS WITHOUT CONTRAST TECHNIQUE: Multidetector CT imaging of the abdomen and pelvis was performed following the standard protocol without IV contrast. COMPARISON:  Lumbar spine radiographs performed 04/07/2013 FINDINGS: The visualized lung bases are clear. The liver and spleen are unremarkable in appearance. The gallbladder is within normal limits. The pancreas and adrenal glands are unremarkable. Two small stones are noted within the right kidney, the larger of which measures 3 mm at the lower pole. Mild nonspecific left-sided perinephric stranding is noted. There is no evidence of hydronephrosis. No obstructing ureteral stones are seen. No free fluid is identified. The small bowel is unremarkable in appearance. The stomach is within normal limits. No acute vascular abnormalities are seen. Mild scattered calcification is noted along the abdominal aorta. The appendix is normal in caliber, without evidence of appendicitis. The colon is grossly unremarkable in appearance. The bladder is mildly  distended and grossly unremarkable. The prostate remains normal in size. No inguinal lymphadenopathy is seen. No acute osseous abnormalities are identified. IMPRESSION: 1. No acute abnormality seen to explain the patient's symptoms. 2. Small nonobstructing right renal stones measure up to 3 mm in size. 3. Mild scattered calcification along the abdominal aorta. Electronically Signed   By: Roanna Raider M.D.   On: 02/21/2015 02:17   I have personally reviewed and evaluated these images and lab results as part of my medical decision-making.   EKG Interpretation None     MDM   Final diagnoses:  UTI (lower urinary tract infection)  Hematuria   Presents to the emergency department for evaluation of hematuria and dysuria. Patient reports he has been having intermittent episodes of pain in his back and flank area. Last night he had onset of severe pain with urination and gross hematuria. 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 His INR, however, is subtherapeutic. Urinalysis suggest infection. No evidence of kidney stone. Patient administered Rocephin here  in the ER, will continue Keflex. Was instructed to take a single double dose of Coumadin today, then back to normal dosing, as antibiotics can also increase INR. Recheck with his doctor.  I personally performed the services described in this documentation, which was scribed in my presence. The recorded information has been reviewed and is accurate.     Gilda Crease, MD 02/21/15 [phone]

## 2015-02-21 NOTE — ED Notes (Signed)
Pt left with all his belongings and ambulated out of the treatment area.  

## 2015-02-21 NOTE — Discharge Instructions (Signed)
Urinary Tract Infection °Urinary tract infections (UTIs) can develop anywhere along your urinary tract. Your urinary tract is your body's drainage system for removing wastes and extra water. Your urinary tract includes two kidneys, two ureters, a bladder, and a urethra. Your kidneys are a pair of bean-shaped organs. Each kidney is about the size of your fist. They are located below your ribs, one on each side of your spine. °CAUSES °Infections are caused by microbes, which are microscopic organisms, including fungi, viruses, and bacteria. These organisms are so small that they can only be seen through a microscope. Bacteria are the microbes that most commonly cause UTIs. °SYMPTOMS  °Symptoms of UTIs may vary by age and gender of the patient and by the location of the infection. Symptoms in young women typically include a frequent and intense urge to urinate and a painful, burning feeling in the bladder or urethra during urination. Older women and men are more likely to be tired, shaky, and weak and have muscle aches and abdominal pain. A fever may mean the infection is in your kidneys. Other symptoms of a kidney infection include pain in your back or sides below the ribs, nausea, and vomiting. °DIAGNOSIS °To diagnose a UTI, your caregiver will ask you about your symptoms. Your caregiver will also ask you to provide a urine sample. The urine sample will be tested for bacteria and white blood cells. White blood cells are made by your body to help fight infection. °TREATMENT  °Typically, UTIs can be treated with medication. Because most UTIs are caused by a bacterial infection, they usually can be treated with the use of antibiotics. The choice of antibiotic and length of treatment depend on your symptoms and the type of bacteria causing your infection. °HOME CARE INSTRUCTIONS °· If you were prescribed antibiotics, take them exactly as your caregiver instructs you. Finish the medication even if you feel better after  you have only taken some of the medication. °· Drink enough water and fluids to keep your urine clear or pale yellow. °· Avoid caffeine, tea, and carbonated beverages. They tend to irritate your bladder. °· Empty your bladder often. Avoid holding urine for long periods of time. °· Empty your bladder before and after sexual intercourse. °· After a bowel movement, women should cleanse from front to back. Use each tissue only once. °SEEK MEDICAL CARE IF:  °· You have back pain. °· You develop a fever. °· Your symptoms do not begin to resolve within 3 days. °SEEK IMMEDIATE MEDICAL CARE IF:  °· You have severe back pain or lower abdominal pain. °· You develop chills. °· You have nausea or vomiting. °· You have continued burning or discomfort with urination. °MAKE SURE YOU:  °· Understand these instructions. °· Will watch your condition. °· Will get help right away if you are not doing well or get worse. °  °This information is not intended to replace advice given to you by your health care provider. Make sure you discuss any questions you have with your health care provider. °  °Document Released: 10/29/2004 Document Revised: 10/10/2014 Document Reviewed: 02/27/2011 °Elsevier Interactive Patient Education ©2016 Elsevier Inc. ° °Hematuria, Adult °Hematuria is blood in your urine. It can be caused by a bladder infection, kidney infection, prostate infection, kidney stone, or cancer of your urinary tract. Infections can usually be treated with medicine, and a kidney stone usually will pass through your urine. If neither of these is the cause of your hematuria, further workup to find out   the reason may be needed. °It is very important that you tell your health care provider about any blood you see in your urine, even if the blood stops without treatment or happens without causing pain. Blood in your urine that happens and then stops and then happens again can be a symptom of a very serious condition. Also, pain is not a  symptom in the initial stages of many urinary cancers. °HOME CARE INSTRUCTIONS  °· Drink lots of fluid, 3-4 quarts a day. If you have been diagnosed with an infection, cranberry juice is especially recommended, in addition to large amounts of water. °· Avoid caffeine, tea, and carbonated beverages because they tend to irritate the bladder. °· Avoid alcohol because it may irritate the prostate. °· Take all medicines as directed by your health care provider. °· If you were prescribed an antibiotic medicine, finish it all even if you start to feel better. °· If you have been diagnosed with a kidney stone, follow your health care provider's instructions regarding straining your urine to catch the stone. °· Empty your bladder often. Avoid holding urine for long periods of time. °· After a bowel movement, women should cleanse front to back. Use each tissue only once. °· Empty your bladder before and after sexual intercourse if you are a male. °SEEK MEDICAL CARE IF: °· You develop back pain. °· You have a fever. °· You have a feeling of sickness in your stomach (nausea) or vomiting. °· Your symptoms are not better in 3 days. Return sooner if you are getting worse. °SEEK IMMEDIATE MEDICAL CARE IF:  °· You develop severe vomiting and are unable to keep the medicine down. °· You develop severe back or abdominal pain despite taking your medicines. °· You begin passing a large amount of blood or clots in your urine. °· You feel extremely weak or faint, or you pass out. °MAKE SURE YOU:  °· Understand these instructions. °· Will watch your condition. °· Will get help right away if you are not doing well or get worse. °  °This information is not intended to replace advice given to you by your health care provider. Make sure you discuss any questions you have with your health care provider. °  °Document Released: 01/19/2005 Document Revised: 02/09/2014 Document Reviewed: 09/19/2012 °Elsevier Interactive Patient Education ©2016  Elsevier Inc. ° °

## 2015-03-21 ENCOUNTER — Other Ambulatory Visit: Payer: Self-pay | Admitting: Urgent Care

## 2015-03-21 DIAGNOSIS — Z7901 Long term (current) use of anticoagulants: Secondary | ICD-10-CM

## 2015-03-21 DIAGNOSIS — D689 Coagulation defect, unspecified: Secondary | ICD-10-CM

## 2015-03-22 NOTE — Telephone Encounter (Signed)
Would it not be appropriate to refer pt to coumadin clinic, or will pt not go d/t insurance?

## 2015-03-22 NOTE — Telephone Encounter (Signed)
I'm not sure why or who is managing this pt's coumadin (other than pt has no insurance). Last PT/INR 02/20/15 at hosp. He sees different providers every time. Kathlene November req'd this be sent to [facility] pool next request.

## 2015-03-26 NOTE — Telephone Encounter (Signed)
Dr Jacinto Reap saw this patient in 2015 and said you would monitor his coumadin but he has not seen you since then - it looks like he is seen every 6 months and gets his PT/INRs every 3-4 months.  He smokes and drinks ETOH daily.  I am uncomfortable with doing his refill without regular PT/INR checks - is this something that you would do - I would like to refer him to hematology for this f/u due to his h/o blood clots.

## 2015-03-28 NOTE — Telephone Encounter (Signed)
Yes, I completely agree - I am only willing to rx coumadin if pt follows instructions re f/u visit and INR checks which are every 3 weeks at the most infrequent and only if very stable.  Pt has had years of non-compliance with recommended follow-up.  Advise he made appointment with [facility] - this might end up being less expensive since INRs are run at that appt and not sent out - no charge for venipuncture and specimen handling, etc.  If he has an appointment, ok to refill x 1 mo to get him to that appt only.

## 2015-03-29 NOTE — Telephone Encounter (Signed)
Please make sure the patient gets the information from Dr Clelia Croft.  I am unable to figure out how to refer to [facility] - can someone help me with this please.

## 2015-04-04 NOTE — Telephone Encounter (Signed)
For a referral to [facility] -- Please place as a cardiology referral and indicate that the referral for coumadin monitoring to [facility].  Thank you.

## 2015-04-05 ENCOUNTER — Other Ambulatory Visit: Payer: Self-pay | Admitting: *Deleted

## 2015-04-06 ENCOUNTER — Telehealth: Payer: Self-pay | Admitting: Radiology

## 2015-04-06 NOTE — Telephone Encounter (Signed)
[place]. I am not going to manage or prescribe his coumadin as he does not come in for INR checks when instructed.  He will need to find another provider to do this or we can send him to [facility] clinic which I think would be the best choice.

## 2015-04-06 NOTE — Telephone Encounter (Signed)
I spoke to patient he states we have prescribed his coumadin for 10 years. I have advised him office visit/ labs needed. You have encouraged him to see coumadin clinic. He called and states [place] has [place] How should we advise him further, he is very obviously non compliant with labs and office visits. I am very concerned. He states he spoke to someone yesterday. Someone did leave an unsigned sticky note on this computer, but I do not know what to advise him.

## 2015-04-06 NOTE — Telephone Encounter (Signed)
Today there is a sticky note on the computer patient needs refill of Coumadin. It does not appear we prescribe this for him

## 2015-04-06 NOTE — Telephone Encounter (Signed)
I have advised him.  

## 2015-04-13 NOTE — Addendum Note (Signed)
Addended by: Morrell RiddleWEBER, Lorissa Kishbaugh L on: 04/13/2015 12:36 PM   Modules accepted: Orders

## 2015-09-26 ENCOUNTER — Emergency Department (HOSPITAL_COMMUNITY)
Admission: EM | Admit: 2015-09-26 | Discharge: 2015-09-26 | Disposition: A | Discharge status: Home / Self Care | Payer: Self-pay | Attending: Emergency Medicine | Admitting: Emergency Medicine

## 2015-09-26 ENCOUNTER — Encounter (HOSPITAL_COMMUNITY): Payer: Self-pay | Admitting: *Deleted

## 2015-09-26 ENCOUNTER — Emergency Department (HOSPITAL_COMMUNITY): Payer: Self-pay

## 2015-09-26 DIAGNOSIS — F17210 Nicotine dependence, cigarettes, uncomplicated: Secondary | ICD-10-CM | POA: Insufficient documentation

## 2015-09-26 DIAGNOSIS — I82402 Acute embolism and thrombosis of unspecified deep veins of left lower extremity: Secondary | ICD-10-CM | POA: Insufficient documentation

## 2015-09-26 DIAGNOSIS — Z7901 Long term (current) use of anticoagulants: Secondary | ICD-10-CM | POA: Insufficient documentation

## 2015-09-26 LAB — BASIC METABOLIC PANEL
Anion gap: 5 (ref 5–15)
BUN: 11 mg/dL (ref 6–20)
CHLORIDE: 106 mmol/L (ref 101–111)
CO2: 30 mmol/L (ref 22–32)
Calcium: 9 mg/dL (ref 8.9–10.3)
Creatinine, Ser: 0.86 mg/dL (ref 0.61–1.24)
GFR calc Af Amer: >60 mL/min (ref >60)
GFR calc non Af Amer: >60 mL/min (ref >60)
GLUCOSE: 70 mg/dL (ref 65–99)
Potassium: 4.4 mmol/L (ref 3.5–5.1)
Sodium: 141 mmol/L (ref 135–145)

## 2015-09-26 LAB — CBC WITH DIFFERENTIAL/PLATELET
Basophils Absolute: 0.1 10*3/uL (ref 0.0–0.1)
Basophils Relative: 1 %
Eosinophils Absolute: 0.2 10*3/uL (ref 0.0–0.7)
Eosinophils Relative: 3 %
HCT: 45.6 % (ref 39.0–52.0)
HEMOGLOBIN: 15.4 g/dL (ref 13.0–17.0)
LYMPHS ABS: 1.6 10*3/uL (ref 0.7–4.0)
Lymphocytes Relative: 21 %
MCH: 32.6 pg (ref 26.0–34.0)
MCHC: 33.8 g/dL (ref 30.0–36.0)
MCV: 96.6 fL (ref 78.0–100.0)
MONOS PCT: 8 %
Monocytes Absolute: 0.6 10*3/uL (ref 0.1–1.0)
NEUTROS PCT: 67 %
Neutro Abs: 5.1 10*3/uL (ref 1.7–7.7)
Platelets: 194 10*3/uL (ref 150–400)
RBC: 4.72 MIL/uL (ref 4.22–5.81)
RDW: 14.5 % (ref 11.5–15.5)
WBC: 7.5 10*3/uL (ref 4.0–10.5)

## 2015-09-26 LAB — PROTIME-INR
INR: 0.87
Prothrombin Time: 11.8 s (ref 11.4–15.2)

## 2015-09-26 MED ORDER — ENOXAPARIN SODIUM 120 MG/0.8ML ~~LOC~~ SOLN
120.0000 mg | Freq: Once | SUBCUTANEOUS | Status: AC
  Administered 2015-09-26: 120 mg via SUBCUTANEOUS
  Filled 2015-09-26: qty 0.8

## 2015-09-26 MED ORDER — WARFARIN SODIUM 2.5 MG PO TABS: 5.0000 mg | ORAL_TABLET | Freq: Every day | ORAL | 0 refills | Status: DC

## 2015-09-26 MED ORDER — WARFARIN SODIUM 5 MG PO TABS
5.0000 mg | ORAL_TABLET | Freq: Once | ORAL | Status: AC
  Administered 2015-09-26: 5 mg via ORAL
  Filled 2015-09-26: qty 1

## 2015-09-26 NOTE — ED Notes (Signed)
Patient to US

## 2015-09-26 NOTE — ED Notes (Signed)
Patient with no complaints at this time. Respirations even and unlabored. Skin warm/dry. Discharge instructions reviewed with patient at this time. Patient given opportunity to voice concerns/ask questions. Patient discharged at this time and left Emergency Department with steady gait.   

## 2015-09-26 NOTE — ED Triage Notes (Signed)
Pt comes in for left leg swelling starting two weeks ago. Pt states this pain started in his calf and has moved up. History of DVT's, pt states he is on natural blood thinners.

## 2015-09-26 NOTE — ED Provider Notes (Signed)
[facility]-EMER[facility]ENCY DEPT Provider Note   CSN: [id number] Arrival date & time: 09/26/15  1252     History   Chief Complaint Chief Complaint  Patient presents with  . Leg Swelling    HPI Craig Hahn is a 55 y.o. male. 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 He is on Coumadin for "years". He came off in February and was seeing a "Natural doctor". Taking "herbs and supplements". Over last 2 weeks she developed redness pain as well as left lower extremity. Denies chest pain shortness of breath dyspnea or other symptoms suggestive of PE.  HPI  Past Medical History:  Diagnosis Date  . Anemia   . Clotting disorder (HCC)    Unsure if has ever been worked up, per pet has had 9 clots in the past, last was in 2006, both brothers also have clotting issues  . Depression     Patient Active Problem List   Diagnosis Date Noted  . DVT (deep venous thrombosis) (HCC) 11/23/2013    Past Surgical History:  Procedure Laterality Date  . dental implants         Home Medications    Prior to Admission medications   Medication Sig Start Date End Date Taking? Authorizing Provider  cephALEXin (KEFLEX) 500 MG capsule Take 1 capsule (500 mg total) by mouth 4 (four) times daily. 02/21/15   Gilda Creasehristopher J Pollina, Craig Hahn  warfarin (COUMADIN) 2.5 MG tablet Take 2 tablets (5 mg total) by mouth daily. 09/26/15   Craig PorterMark Jesiah Yerby, Craig Hahn    Family History Family History  Problem Relation Age of Onset  . Clotting disorder Brother   . Clotting disorder Brother     Social History Social History  Substance Use Topics  . Smoking status: Current Every Day Smoker    Packs/day: 0.50    Types: E-cigarettes, Cigarettes  . Smokeless tobacco: Never Used  . Alcohol use Yes     Comment: daily 4 beers     Allergies   Codeine; Penicillins; and Sulfa antibiotics   Review of Systems Review of Systems  Constitutional: Negative for appetite change, chills, diaphoresis, fatigue and fever.  HENT:  Negative for mouth sores, sore throat and trouble swallowing.   Eyes: Negative for visual disturbance.  Respiratory: Negative for cough, chest tightness, shortness of breath and wheezing.   Cardiovascular: Positive for leg swelling. Negative for chest pain.  Gastrointestinal: Negative for abdominal distention, abdominal pain, diarrhea, nausea and vomiting.  Endocrine: Negative for polydipsia, polyphagia and polyuria.  Genitourinary: Negative for dysuria, frequency and hematuria.  Musculoskeletal: Negative for gait problem.  Skin: Negative for color change, pallor and rash.  Neurological: Negative for dizziness, syncope, light-headedness and headaches.  Hematological: Does not bruise/bleed easily.  Psychiatric/Behavioral: Negative for behavioral problems and confusion.     Physical Exam Updated Vital Signs BP 126/76 (BP Location: Left Arm)   Pulse 71   Temp 98.2 F (36.8 C) (Oral)   Resp 18   SpO2 97%   Physical Exam  Constitutional: He is oriented to person, place, and time. He appears well-developed and well-nourished. No distress.  HENT:  Head: Normocephalic.  Eyes: Conjunctivae are normal. Pupils are equal, round, and reactive to light. No scleral icterus.  Neck: Normal range of motion. Neck supple. No thyromegaly present.  Cardiovascular: Normal rate and regular rhythm.  Exam reveals no gallop and no friction rub.   No murmur heard. Pulmonary/Chest: Effort normal and breath sounds normal. No respiratory distress. He has no wheezes. He  has no rales.  Abdominal: Soft. Bowel sounds are normal. He exhibits no distension. There is no tenderness. There is no rebound.  Musculoskeletal: Normal range of motion.  Neurological: He is alert and oriented to person, place, and time.  Skin: Skin is warm and dry. No rash noted.  Left lower extremity diffusely edematous palpable cording in the calf and popliteal fossa. Excellent pulses and capillary refill distally.  Psychiatric: He has a  normal mood and affect. His behavior is normal.     ED Treatments / Results  Labs (all labs ordered are listed, but only abnormal results are displayed) Labs Reviewed  CBC WITH DIFFERENTIAL/PLATELET  BASIC METABOLIC PANEL  PROTIME-INR    EKG  EKG Interpretation None       Radiology [place] Venous Img Lower Unilateral Left  Result Date: 09/26/2015 CLINICAL DATA:  Left lower extremity edema for 2 weeks. EXAM: Left LOWER EXTREMITY VENOUS DOPPLER ULTRASOUND TECHNIQUE: Gray-scale sonography with graded compression, as well as color Doppler and duplex ultrasound were performed to evaluate the lower extremity deep venous systems from the level of the common femoral vein and including the common femoral, femoral, profunda femoral, popliteal and calf veins including the posterior tibial, peroneal and gastrocnemius veins when visible. The superficial great saphenous vein was also interrogated. Spectral Doppler was utilized to evaluate flow at rest and with distal augmentation maneuvers in the common femoral, femoral and popliteal veins. COMPARISON:  None. FINDINGS: Contralateral Common Femoral Vein: Respiratory phasicity is normal and symmetric with the symptomatic side. No evidence of thrombus. Normal compressibility. Common Femoral Vein: Noncompressible with no flow seen on Doppler consistent with occlusive thrombus. Saphenofemoral Junction: No evidence of thrombus. Normal compressibility and flow on color Doppler imaging. Profunda Femoral Vein: Noncompressible with no flow seen on Doppler consistent with occlusive thrombus. Femoral Vein: Noncompressible with no flow consistent with occlusive thrombus. Popliteal Vein: Noncompressible with no flow consistent with occlusive thrombus. Calf Veins: Posterior tibial vein appears normal. Peroneal veins are noncompressible with no flow consistent with occlusive thrombus. Superficial Great Saphenous Vein: No evidence of thrombus. Normal compressibility and flow on  color Doppler imaging. Venous Reflux:  None. Other Findings:  None. IMPRESSION: Acute deep venous thrombosis is seen involving the left common femoral, profunda femoral, superficial femoral, popliteal and peroneal veins. The sonographer reports that she discussed these findings with Dr. Fayrene FearingJames at 2:05 p.m. Electronically Signed   By: Lupita RaiderJames  Green Jr, M.D.   On: 09/26/2015 14:55    Procedures Procedures (including critical care time)  Medications Ordered in ED Medications  enoxaparin (LOVENOX) injection 120 mg (not administered)  warfarin (COUMADIN) tablet 5 mg (not administered)     Initial Impression / Assessment and Plan / ED Course  I have reviewed the triage vital signs and the nursing notes.  Pertinent labs & imaging results that were available during my care of the patient were reviewed by me and considered in my medical decision making (see chart for details).  Clinical Course    Patient has DVT from his femoral to his midcalf. He is not tachycardic hypoxemic and has no chest pain or symptoms of suggest embolization. I case manager speak with him. Full Colles are made to see if he would qualify for Xarelto. He did not. Patient states he has no means to afford Lovenox or Xarelto. He is taking Coumadin the past and is comfortable taking Coumadin. Follow-up [facility] in 1 week [facility] [place]. He  was given Lovenox subcutaneous. Given first dose Coumadin by mouth. Prescription for 2.5 mg Coumadin tablets to take 5 mg daily than as per his clinic appointment. All questions were answered.  Final Clinical Impressions(s) / ED Diagnoses   Final diagnoses:  DVT (deep venous thrombosis), left    New Prescriptions New Prescriptions   WARFARIN (COUMADIN) 2.5 MG TABLET    Take 2 tablets (5 mg total) by mouth daily.     Craig Porter, Craig Hahn 09/26/15 1525

## 2015-10-01 NOTE — Care Management Note (Signed)
Case Management Note  Patient Details  Name: Lenice Llamasaul S Hamada MRN: [id number] Date of Birth: 09/14/1960    Expected Discharge Date:                  Expected Discharge Plan:  Home/Self Care  In-House Referral:  NA  Discharge planning Services  CM Consult  Post Acute Care Choice:  NA Choice offered to:  NA  DME Arranged:    DME Agency:     HH Arranged:    HH Agency:     Status of Service:  Completed, signed off  If discussed at [employer] Length of Stay Meetings, dates discussed:    Additional Comments: Late entry: Patient was seen in ED 09/27/2015. Appoint[facility] patient at [facility] as he was previously a patient there earlier this year. Patient is to call this Thursday after 2 to be able to schedule an appt for Friday (Sept 1st) to have his INR checked. Patient made aware and agreeable.  Emmalie Haigh, Chrystine OilerSharley Diane, RN 10/01/2015, 2:40 PM

## 2015-10-04 ENCOUNTER — Emergency Department (HOSPITAL_COMMUNITY)
Admission: EM | Admit: 2015-10-04 | Discharge: 2015-10-05 | Disposition: A | Discharge status: Home / Self Care | Payer: Self-pay | Attending: Emergency Medicine | Admitting: Emergency Medicine

## 2015-10-04 ENCOUNTER — Ambulatory Visit (INDEPENDENT_AMBULATORY_CARE_PROVIDER_SITE_OTHER): Payer: Self-pay | Admitting: Family Medicine

## 2015-10-04 ENCOUNTER — Emergency Department (HOSPITAL_COMMUNITY): Payer: Self-pay

## 2015-10-04 ENCOUNTER — Encounter: Payer: Self-pay | Admitting: Family Medicine

## 2015-10-04 ENCOUNTER — Encounter (HOSPITAL_COMMUNITY): Payer: Self-pay | Admitting: Emergency Medicine

## 2015-10-04 VITALS — BP 114/60 | HR 56 | Temp 98.0°F | Resp 18 | Ht 71.5 in | Wt 170.0 lb

## 2015-10-04 DIAGNOSIS — I2699 Other pulmonary embolism without acute cor pulmonale: Secondary | ICD-10-CM | POA: Insufficient documentation

## 2015-10-04 DIAGNOSIS — D689 Coagulation defect, unspecified: Secondary | ICD-10-CM | POA: Insufficient documentation

## 2015-10-04 DIAGNOSIS — R1032 Left lower quadrant pain: Secondary | ICD-10-CM

## 2015-10-04 DIAGNOSIS — F17210 Nicotine dependence, cigarettes, uncomplicated: Secondary | ICD-10-CM | POA: Insufficient documentation

## 2015-10-04 DIAGNOSIS — R079 Chest pain, unspecified: Secondary | ICD-10-CM

## 2015-10-04 DIAGNOSIS — I824Y2 Acute embolism and thrombosis of unspecified deep veins of left proximal lower extremity: Secondary | ICD-10-CM

## 2015-10-04 DIAGNOSIS — R002 Palpitations: Secondary | ICD-10-CM

## 2015-10-04 DIAGNOSIS — R0600 Dyspnea, unspecified: Secondary | ICD-10-CM

## 2015-10-04 DIAGNOSIS — Z7901 Long term (current) use of anticoagulants: Secondary | ICD-10-CM | POA: Insufficient documentation

## 2015-10-04 LAB — BASIC METABOLIC PANEL
ANION GAP: 4 — AB (ref 5–15)
BUN: 18 mg/dL (ref 6–20)
CHLORIDE: 108 mmol/L (ref 101–111)
CO2: 29 mmol/L (ref 22–32)
Calcium: 9.1 mg/dL (ref 8.9–10.3)
Creatinine, Ser: 0.97 mg/dL (ref 0.61–1.24)
GFR calc Af Amer: >60 mL/min (ref >60)
GLUCOSE: 91 mg/dL (ref 65–99)
POTASSIUM: 4.6 mmol/L (ref 3.5–5.1)
SODIUM: 141 mmol/L (ref 135–145)

## 2015-10-04 LAB — URINALYSIS, ROUTINE W REFLEX MICROSCOPIC
BILIRUBIN URINE: NEGATIVE
Glucose, UA: NEGATIVE mg/dL
Hgb urine dipstick: NEGATIVE
KETONES UR: NEGATIVE mg/dL
NITRITE: NEGATIVE
PROTEIN: NEGATIVE mg/dL
Specific Gravity, Urine: 1.018 (ref 1.005–1.030)
pH: 6 (ref 5.0–8.0)

## 2015-10-04 LAB — I-STAT TROPONIN, ED: Troponin i, poc: 0 ng/mL (ref 0.00–0.08)

## 2015-10-04 LAB — CBC
HEMATOCRIT: 46.5 % (ref 39.0–52.0)
HEMOGLOBIN: 15.3 g/dL (ref 13.0–17.0)
MCH: 31.9 pg (ref 26.0–34.0)
MCHC: 32.9 g/dL (ref 30.0–36.0)
MCV: 97.1 fL (ref 78.0–100.0)
Platelets: 229 10*3/uL (ref 150–400)
RBC: 4.79 MIL/uL (ref 4.22–5.81)
RDW: 15 % (ref 11.5–15.5)
WBC: 6.3 10*3/uL (ref 4.0–10.5)

## 2015-10-04 LAB — URINE MICROSCOPIC-ADD ON: RBC / HPF: NONE SEEN RBC/hpf (ref 0–5)

## 2015-10-04 LAB — PROTIME-INR
INR: 2.51
PROTHROMBIN TIME: 27.6 s — AB (ref 11.4–15.2)

## 2015-10-04 IMAGING — CR DG CHEST 2V
2 series · 2 of 2 positions shown · non-contrast
Comparison: None.

CLINICAL DATA: 55 y/o M; history DVT new onset shortness of breath
and chest pain.

EXAM:
CHEST  2 VIEW

[w chest pa]
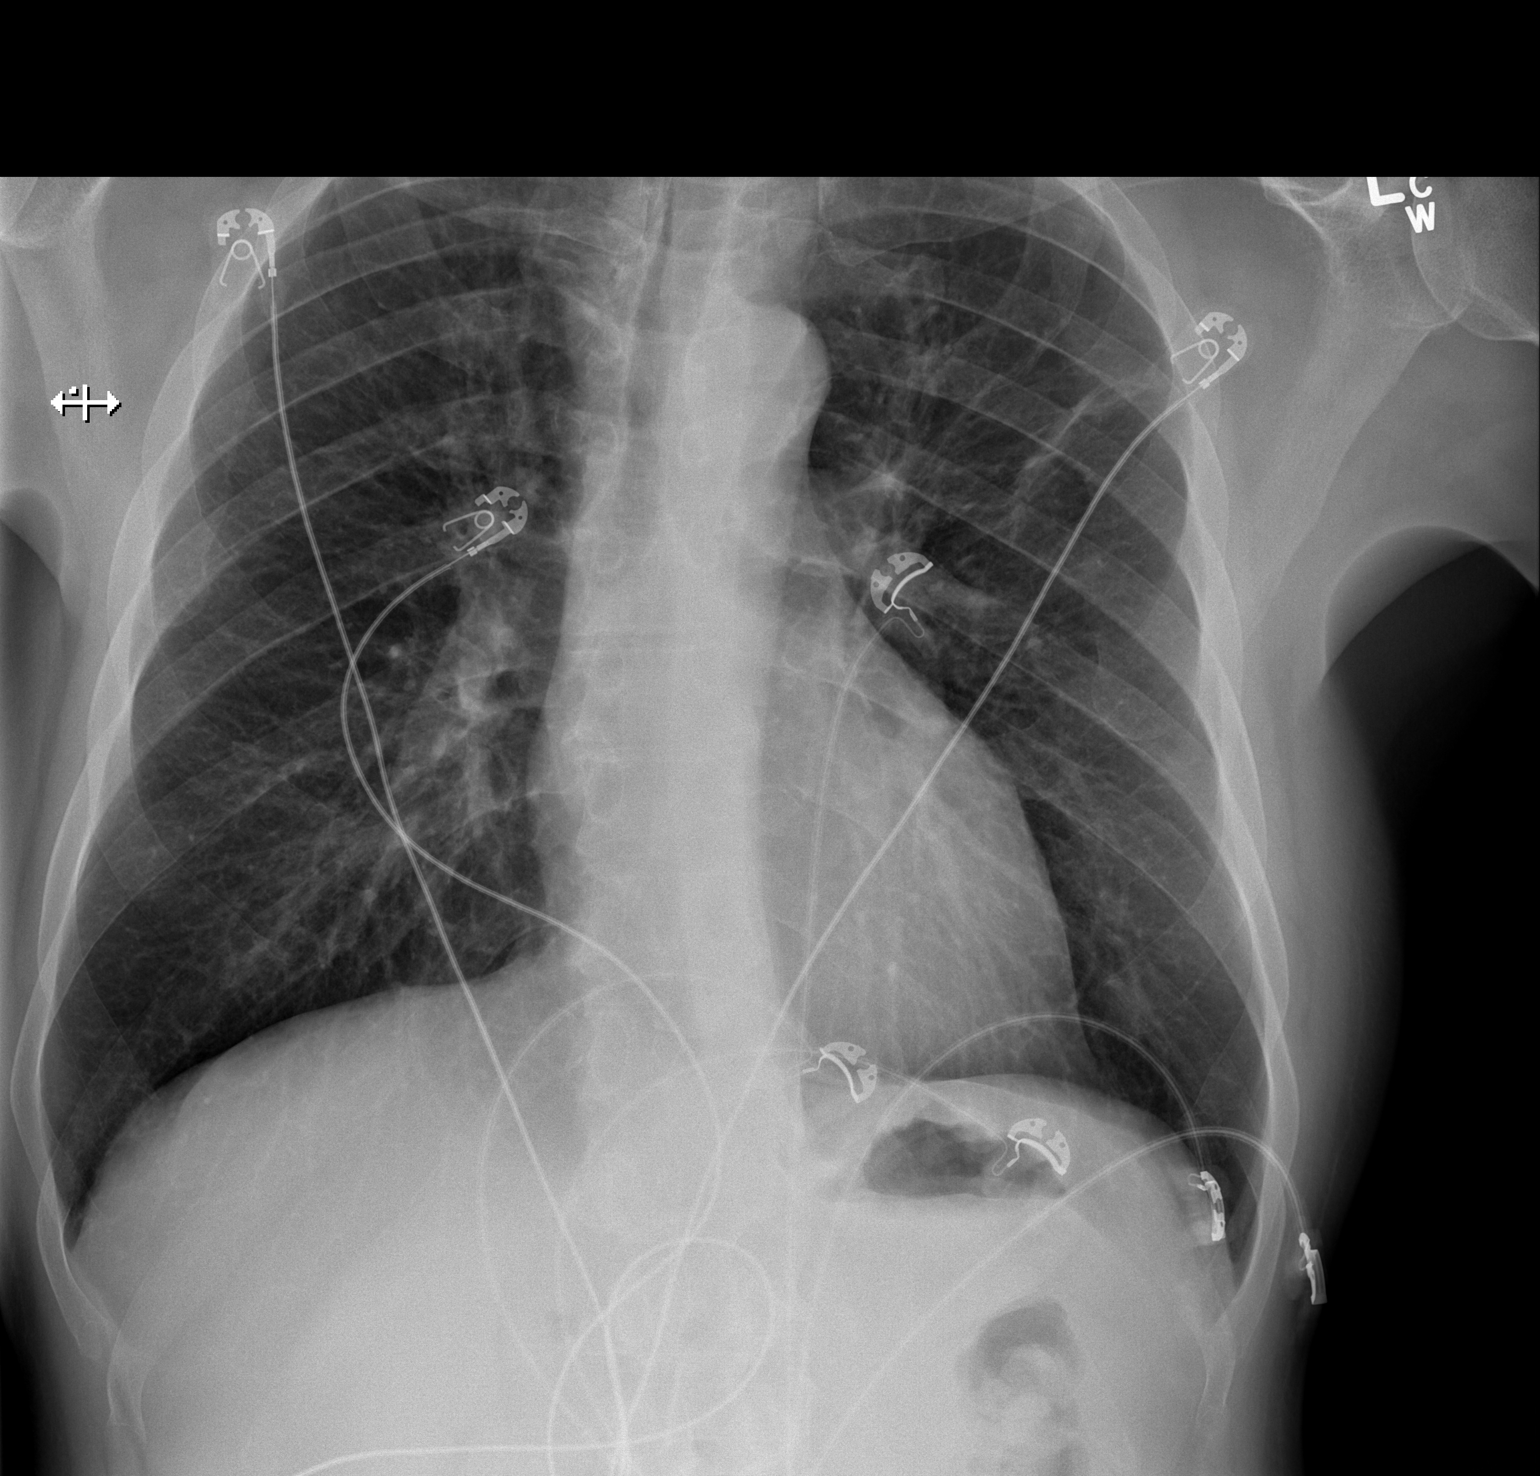

[w chest lat]
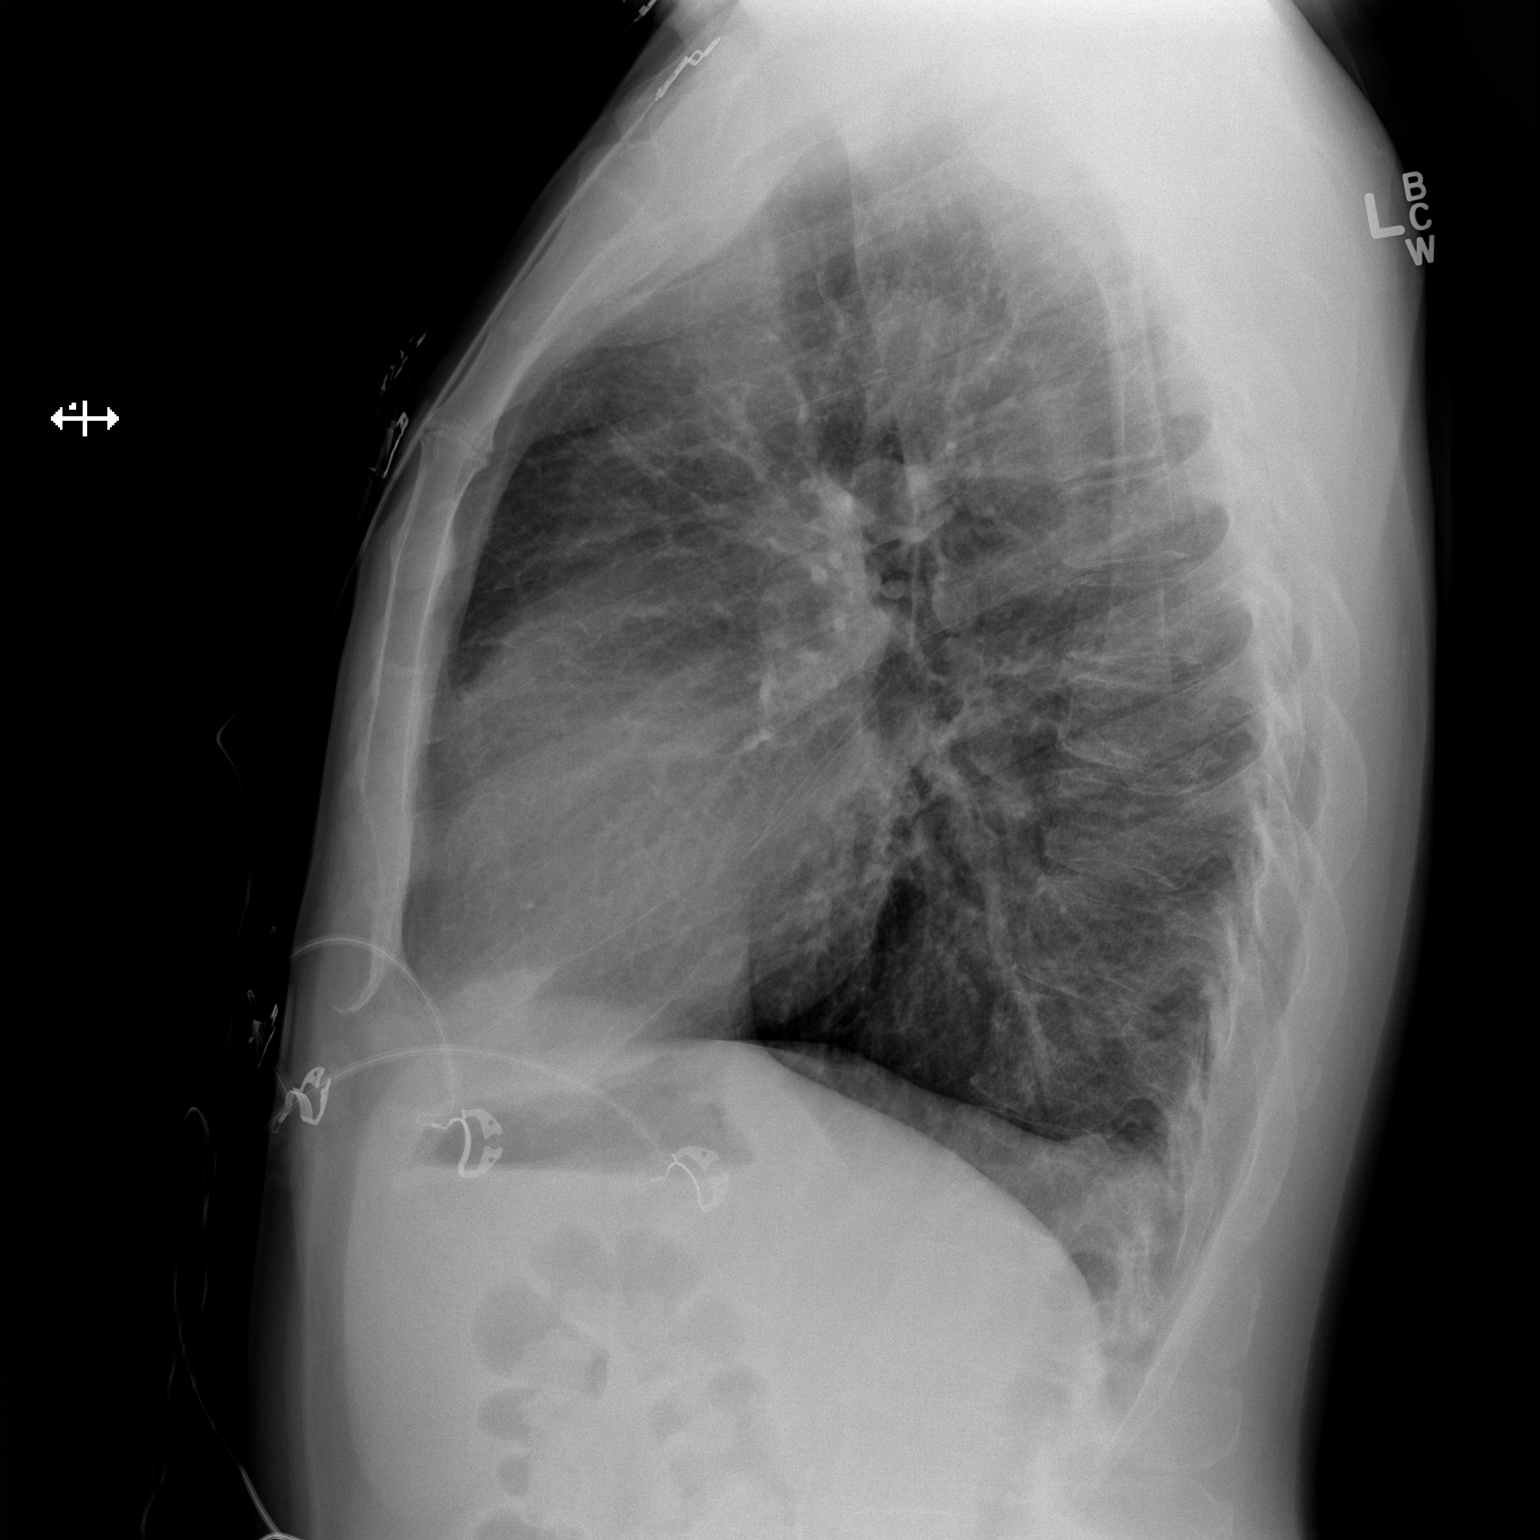

[2 of 2 positions shown; findings below may reference images not displayed]

FINDINGS: The heart size and mediastinal contours are within normal limits. No
consolidation, pneumothorax, or pleural effusion. Linear opacity in
left upper lobe is probably scarring or atelectasis. Mild rightward
curvature of the thoracic spine.
IMPRESSION: No active cardiopulmonary disease.

By: ES M.D.

## 2015-10-04 IMAGING — CT CT ANGIO CHEST
2 of 12 series · 16 of 38 positions shown · IV contrast (ISOVUE)
Comparison: COMPARISON
CT abdomen pelvis [DATE].

CLINICAL DATA: 55 y/o M; DVT in left leg for 2 weeks presenting
with intermittent shortness of breath and chest pain.

EXAM:
CT ANGIOGRAPHY CHEST
CT ANGIOGRAPHY ABDOMEN AND PELVIS WITH CONTRAST
TECHNIQUE: Multidetector CT imaging of the chest was performed using the
standard protocol during bolus administration of intravenous
contrast. Multiplanar CT image reconstructions and MIPs were
obtained to evaluate the vascular anatomy. Multidetector CT imaging
of the abdomen and pelvis was performed using the standard protocol
during bolus administration of intravenous contrast.
CONTRAST:  100 cc Isovue 370.

[Series 5: abd/pel with · axial · 0.71mm/px · z∈[+858,+1093]mm · 3 of 95 slices shown]
[im 24/95  lung]
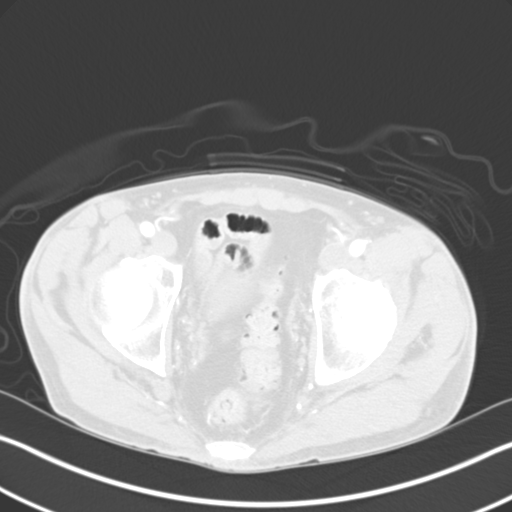
[im 48/95  lung]
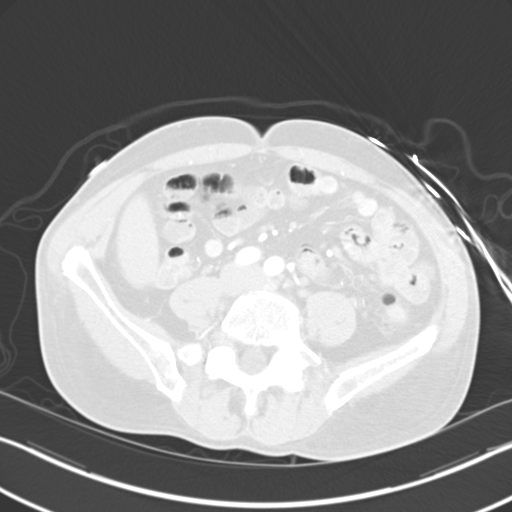
[im 71/95  lung]
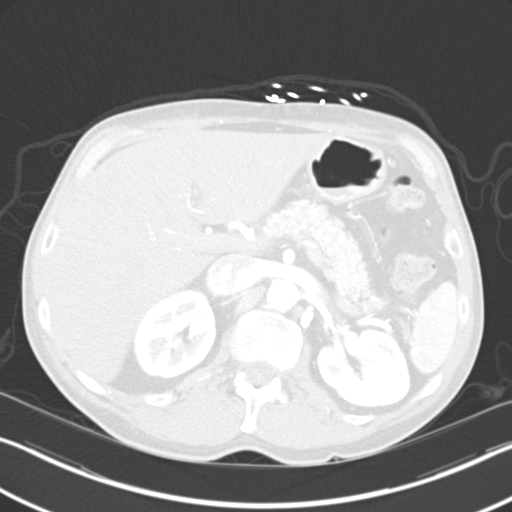

[Series 7: thins for pacs · axial · 0.71mm/px · z∈[+1122,+1368]mm · 13 of 288 slices shown]
[im 21/288  lung]
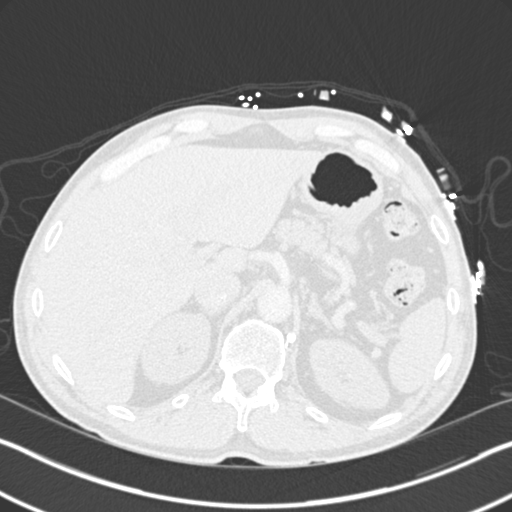
[im 42/288  mediastinal]
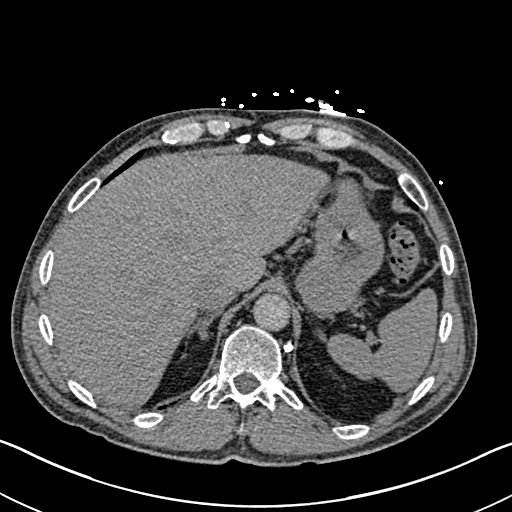
[im 62/288  lung]
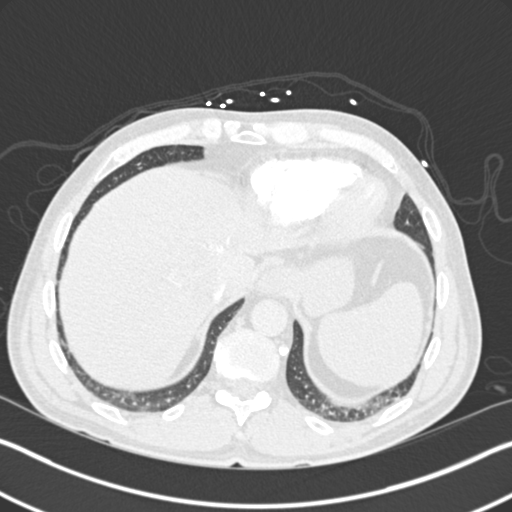
[im 83/288  mediastinal]
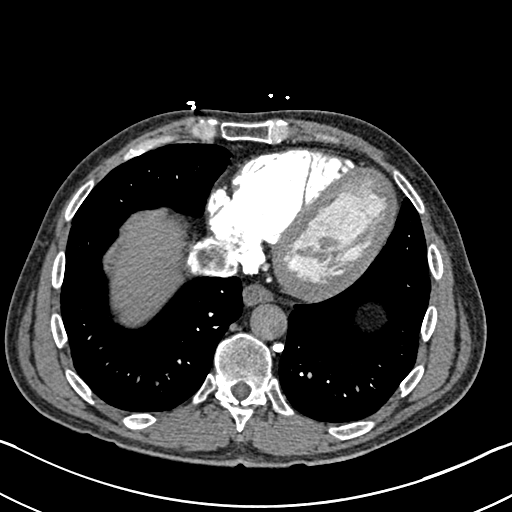
[im 103/288  lung]
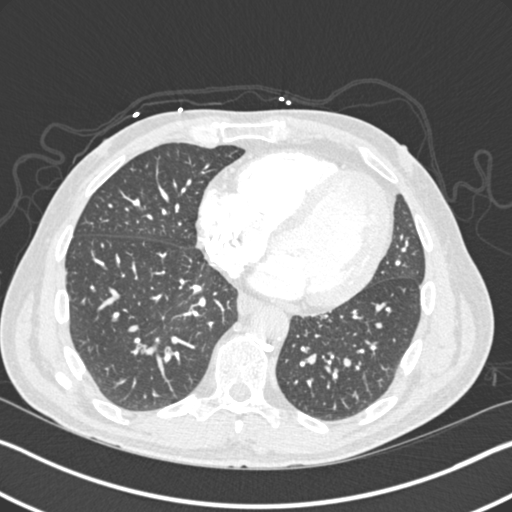
[im 124/288  mediastinal]
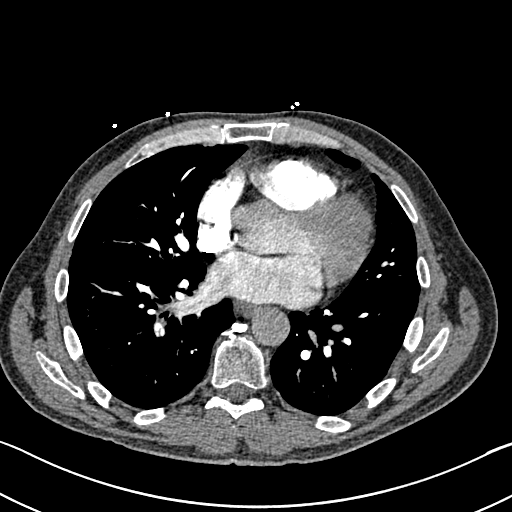
[im 144/288  lung]
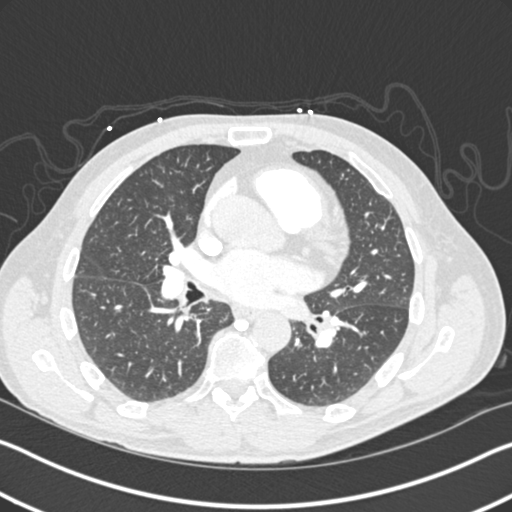
[im 165/288  mediastinal]
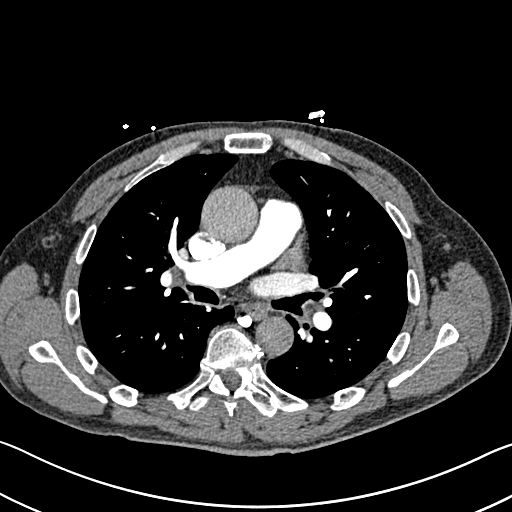
[im 185/288  lung]
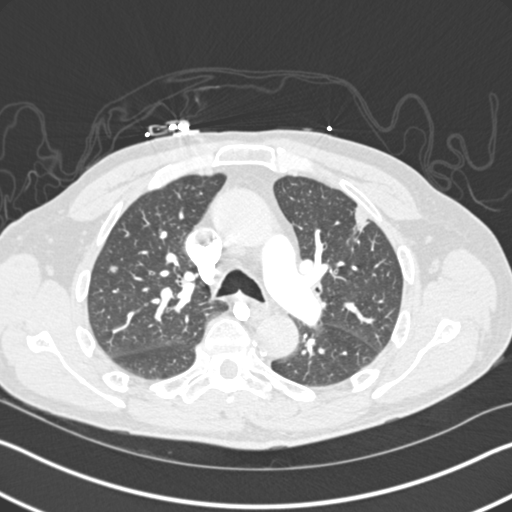
[im 206/288  mediastinal]
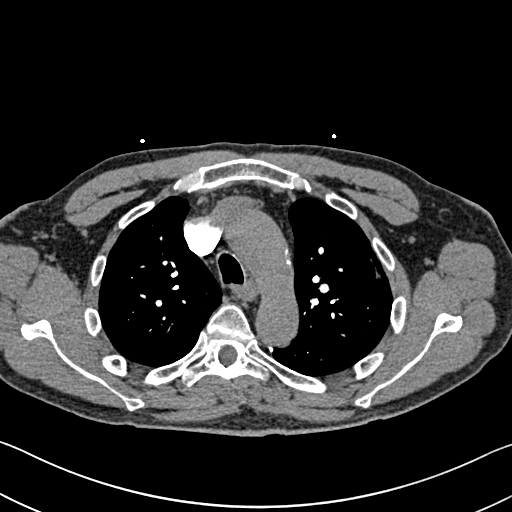
[im 226/288  lung]
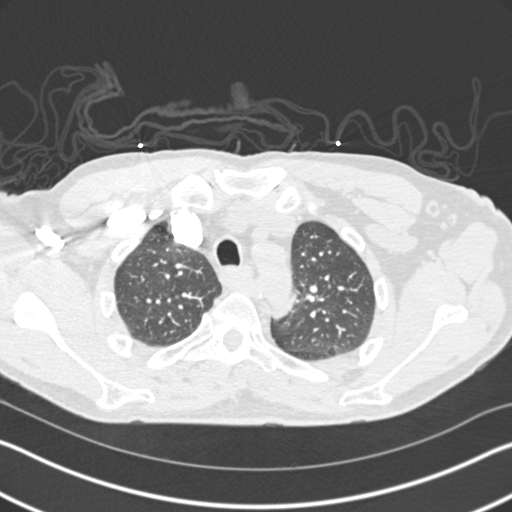
[im 247/288  mediastinal]
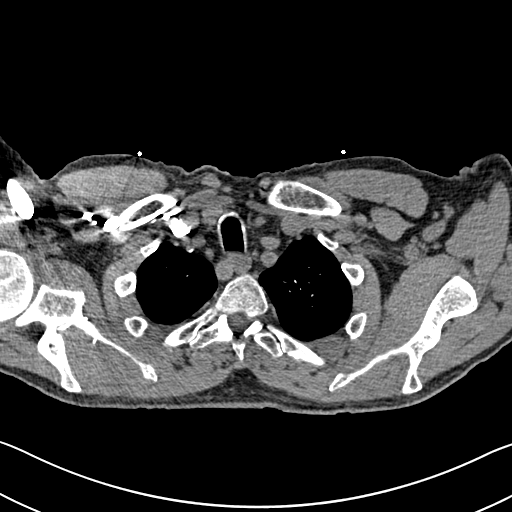
[im 267/288  lung]
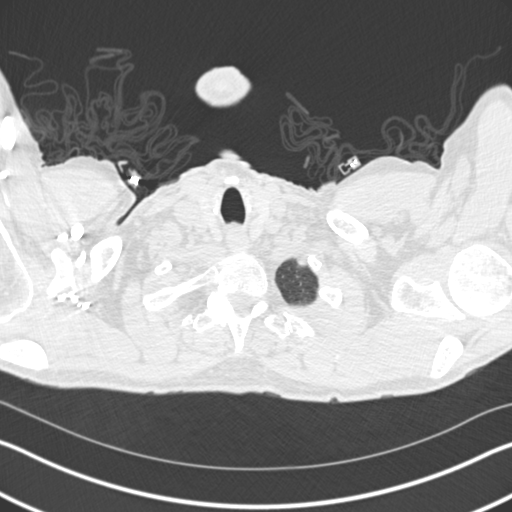

[16 of 38 positions shown; findings below may reference images not displayed]

FINDINGS: CTA CHEST FINDINGS

Mediastinum/Lymph Nodes: Filling defect within the right lower lobe
posterior basal segment artery (series 4, image 54). No additional
pulmonary embolus is identified. Mild calcific aortic
atherosclerosis of the arch. 3.9 cm ascending aorta. Normal heart
size. No pericardial effusion.

Lungs/Pleura: No pulmonary mass, infiltrate, or effusion. Left upper
lobe linear opacity may represent atelectasis or scarring. 3 mm left
upper lobe nodule (series 4, image 42). 5.8 mm right upper lobe
nodule (series 4, image 35).

Musculoskeletal: No chest wall mass or suspicious bone lesions
identified.

CT ABDOMEN and PELVIS FINDINGS

Hepatobiliary: No masses or other significant abnormality.

Pancreas: No mass, inflammatory changes, or other significant
abnormality.

Spleen: Within normal limits in size and appearance.

Adrenals/Urinary Tract: No masses identified. No evidence of
hydronephrosis. Right kidney lower pole 3 mm nonobstructing stone.

Stomach/Bowel: No evidence of obstruction, inflammatory process, or
abnormal fluid collections. Normal appendix.

Vascular/Lymphatic: No lymphadenopathy identified. Moderate calcific
aortic atherosclerosis of the infrarenal abdominal aorta.

Reproductive: No mass or other significant abnormality.

Other: Small right inguinal fat containing hernia.

Musculoskeletal: Degenerative changes of the lumbar spine with
prominent lower lumbar facet arthrosis.

Review of the MIP images confirms the above findings.
IMPRESSION: 1. Right lower lobe segmental pulmonary embolus.
2. 3.9 cm ectatic ascending aorta.
3. **An incidental finding of potential clinical significance has
been found. Pulmonary nodules measuring up to 5.8 mm. No follow-up
needed if patient is low-risk. Non-contrast chest CT can be
considered in 12 months if patient is high-risk. This recommendation
follows the consensus statement: Guidelines for Management of
Incidental Pulmonary Nodules Detected on CT Images:From the
[HOSPITAL] [PF]; published online before print
(10.1148/radiol.[PHONE_NUMBER]).**
4. Right kidney nonobstructive nephrolithiasis.
5. Aortic atherosclerosis.
These results were called by telephone at the time of interpretation
on [DATE] at [DATE] to Dr. EBEN , who verbally
acknowledged these results.

By: EBEN M.D.

## 2015-10-04 MED ORDER — IOPAMIDOL (ISOVUE-370) INJECTION 76%
100.0000 mL | Freq: Once | INTRAVENOUS | Status: AC | PRN
  Administered 2015-10-04: 100 mL via INTRAVENOUS

## 2015-10-04 MED ORDER — SODIUM CHLORIDE 0.9 % IV BOLUS (SEPSIS)
1000.0000 mL | Freq: Once | INTRAVENOUS | Status: AC
  Administered 2015-10-04: 1000 mL via INTRAVENOUS

## 2015-10-04 NOTE — ED Provider Notes (Signed)
[facility]-EMERGENCY DEPT Provider Note   CSN: [id number] Arrival date & time: 10/04/15  1711     History   Chief Complaint Chief Complaint  Patient presents with  . Eval for possible DVT    HPI Craig Hahn is a 55 y.o. male.  HPI   Patient sent in from [facility] for CP, SOB, productive cough x 1.5 months in the setting of known left leg DVT, off coumadin since March.  Also was exposed to "a chemical" and has had shallow breathing ever since.  He is a smoker.   Pt also admits to increasing abdominal pain and he feels that the clot has been creeping up from his left groin.  Also has dysuria that has been ongoing in the same timeframe.  Denies fevers, hemoptysis.  Pt states he was on coumadin for many years after DVT and stopped last March after a disagreement with his primary care provider about need to come in for INR testing.  Started on homeopathic medications.  Last week was diagnosed with LLE DVT and started on coumadin.    Past Medical History:  Diagnosis Date  . Anemia   . Clotting disorder (HCC)    Unsure if has ever been worked up, per pet has had 9 clots in the past, last was in 2006, both brothers also have clotting issues  . Depression     Patient Active Problem List   Diagnosis Date Noted  . DVT (deep venous thrombosis) (HCC) 11/23/2013    Past Surgical History:  Procedure Laterality Date  . dental implants         Home Medications    Prior to Admission medications   Medication Sig Start Date End Date Taking? Authorizing Provider  Ascorbic Acid (VITAMIN C PO) Take 1 tablet by mouth 3 (three) times daily.   Yes Historical Provider, MD  Calcium-Magnesium-Vitamin D (CALCIUM MAGNESIUM PO) Take 2 tablets by mouth daily.   Yes Historical Provider, MD  Capsicum, Cayenne, (CAYENNE PEPPER PO) Take 1 tablet by mouth daily.   Yes Historical Provider, MD  CHLOROPHYLL PO Take 1 tablet by mouth daily.   Yes Historical Provider, MD  NATTOKINASE PO Take 1 tablet by  mouth daily.   Yes Historical Provider, MD  NON FORMULARY Take 1 tablet by mouth daily. Cyruta Plus   Yes Historical Provider, MD  omega-3 acid ethyl esters (LOVAZA) 1 g capsule Take 2 g by mouth daily.   Yes Historical Provider, MD  TURMERIC PO Take 1 tablet by mouth daily.   Yes Historical Provider, MD  warfarin (COUMADIN) 2.5 MG tablet Take 2 tablets (5 mg total) by mouth daily. 09/26/15  Yes Rolland Porter, MD    Family History Family History  Problem Relation Age of Onset  . Heart attack Father   . Clotting disorder Brother   . Clotting disorder Brother     Social History Social History  Substance Use Topics  . Smoking status: Current Some Day Smoker    Packs/day: 0.50    Types: E-cigarettes, Cigarettes  . Smokeless tobacco: Never Used  . Alcohol use Yes     Comment: daily 4 beers     Allergies   Codeine; Penicillins; and Sulfa antibiotics   Review of Systems Review of Systems  All other systems reviewed and are negative.    Physical Exam Updated Vital Signs BP 138/85 (BP Location: Right Arm)   Pulse (!) 50   Temp 98.2 F (36.8 C) (Oral)   Resp 15  SpO2 98%   Physical Exam  Constitutional: He appears well-developed and well-nourished. No distress.  HENT:  Head: Normocephalic and atraumatic.  Neck: Neck supple.  Cardiovascular: Normal rate and regular rhythm.   Pulmonary/Chest: Effort normal and breath sounds normal. No respiratory distress. He has no wheezes. He has no rales.  Abdominal: Soft. He exhibits no distension and no mass. There is tenderness (diffuse). There is no rebound and no guarding.  Musculoskeletal:  Left lower extremity edematous, tender to palpation.  Distal pulses intact.    Neurological: He is alert. He exhibits normal muscle tone.  Skin: He is not diaphoretic.  Nursing note and vitals reviewed.    ED Treatments / Results  Labs (all labs ordered are listed, but only abnormal results are displayed) Labs Reviewed  BASIC METABOLIC  PANEL - Abnormal; Notable for the following:       Result Value   Anion gap 4 (*)    All other components within normal limits  PROTIME-INR - Abnormal; Notable for the following:    Prothrombin Time 27.6 (*)    All other components within normal limits  URINALYSIS, ROUTINE W REFLEX MICROSCOPIC (NOT AT [facility]) - Abnormal; Notable for the following:    APPearance CLOUDY (*)    Leukocytes, UA TRACE (*)    All other components within normal limits  URINE MICROSCOPIC-ADD ON - Abnormal; Notable for the following:    Squamous Epithelial / LPF 0-5 (*)    Bacteria, UA RARE (*)    All other components within normal limits  CBC  I-STAT TROPOININ, ED    EKG  EKG Interpretation  Date/Time:  Friday October 04 2015 17:50:40 EDT Ventricular Rate:  50 PR Interval:    QRS Duration: 88 QT Interval:  464 QTC Calculation: 424 R Axis:   57 Text Interpretation:  Sinus bradycardia Probable anteroseptal infarct, old No old tracing to compare Confirmed by GOLDSTON MD, SCOTT [phone]) on 10/04/2015 6:10:17 PM       Radiology Dg Chest 2 View  Result Date: 10/04/2015 CLINICAL DATA:  55 y/o M; history DVT new onset shortness of breath and chest pain. EXAM: CHEST  2 VIEW COMPARISON:  None. FINDINGS: The heart size and mediastinal contours are within normal limits. No consolidation, pneumothorax, or pleural effusion. Linear opacity in left upper lobe is probably scarring or atelectasis. Mild rightward curvature of the thoracic spine. IMPRESSION: No active cardiopulmonary disease. Electronically Signed   By: Mitzi Hansen M.D.   On: 10/04/2015 18:25    Procedures Procedures (including critical care time)  Medications Ordered in ED Medications  sodium chloride 0.9 % bolus 1,000 mL (1,000 mLs Intravenous New Bag/Given 10/04/15 2119)  iopamidol (ISOVUE-370) 76 % injection 100 mL (100 mLs Intravenous Contrast Given 10/04/15 2131)     Initial Impression / Assessment and Plan / ED Course  I have  reviewed the triage vital signs and the nursing notes.  Pertinent labs & imaging results that were available during my care of the patient were reviewed by me and considered in my medical decision making (see chart for details).  Clinical Course   Afebrile nontoxic patient with known DVT recently started on coumadin after being off coumadin for many months, reported CP, SOB, cough x 1.5 months, also abdominal pain and dysuria for the same time period.  Pt is therapeutic on coumadin currently.  Signed out to [employer], PA-C, at change of shift pending CT angio chest/abd/pelvis.    Final Clinical Impressions(s) / ED Diagnoses  Final diagnoses:  None    New Prescriptions New Prescriptions   No medications on file     Trixie Dredgemily Chaniqua Brisby, PA-C 10/04/15 2218    Pricilla LovelessScott Goldston, MD 10/06/15 [phone]

## 2015-10-04 NOTE — ED Notes (Signed)
Dr. Chilton SiGreen called from [facility] and reports that pt will arrive by POV. Pt has hx of DVT and has been off of his coumadin. Pt was dx with DVT at [facility] 8 days ago. Pt is taking coumadin but has new onset SOB and CP. Pt sent to be evaluated for PE.

## 2015-10-04 NOTE — ED Triage Notes (Signed)
Patient presents for DVT in left leg x2 weeks. Reports he was seen at [facility] and they started him on coumadin. Patient has history of DVTs. Reports PCP sent him for intermittent SOB, CP x2 weeks. A&O x4.

## 2015-10-04 NOTE — Progress Notes (Signed)
Subjective:  By signing my name below, I, Stann Ore, attest that this documentation has been prepared under the direction and in the presence of Meredith Staggers, MD. Electronically Signed: Stann Ore, Scribe. 10/04/2015 , 2:36 PM .  Patient was seen in Room 10 .   Patient ID: Craig Hahn, male    DOB: 11-17-1960, 55 y.o.   MRN: [id number] Chief Complaint  Patient presents with   Lab work    PT and INR   HPI Craig Hahn is a 55 y.o. male  Here for coumadin check. He was seen in the [facility] ER on 09/26/15. He has a history of recurrent lower extremity DVT's. Per ER note, he stopped his coumadin in February and was taking herbs and supplements under care of a "natural doctor". He believes his reasoning for DVT recurring this time is because "he was working on his knees". He developed redness with pain in lower extremities in previous 2 weeks. He denied chest pain or dyspnea at that time. He was diagnosed from DVT of femoral to mid calf. He was started on coumadin with 1 week follow up arranged at [facility] clinic in [place]. He was given a Lovenox injection and first dose of coumadin given in ER.   His most recent PT-INR in [facility] is from ER visit with INR of 0.87. His last office visit here was in January 2017. Per previous notes, he has not returned for PT-INR checks. Prior to his March visit, he has not been seen since Sept 2016. Based on Sept 2016 visit, there was some difficulty in history regarding his coumadin, but was taking various herbal medications for homeopathic blood thinning and has been off coumadin for 1 month. He was advised to return in 5 days at that time, which he did not do.   Patient states this is the first time he's being evaluated since ER visit. He wasn't able to be seen at [facility] clinic in [place] because he's from [facility]. He's taking coumadin 2.5mg  bid. He's been off of his medications since March 4th for 2 weeks. He called in for refill but did not  have PT-INR since Jan 18th. He reports still having pain in his legs. He also notes having intermittent shortness of breath and chest pain that started 2 weeks ago. His chest pain has been intermittent between every 1~2 days with flutters in his heart. He describes the flutters in his heart as a "buzz", happening every other day. He has shortness of breath during rest and with activity. He also notes his groin pain worsening since ER visit. He states that he was on coumadin 7.5mg  in the past.   Patient Active Problem List   Diagnosis Date Noted   DVT (deep venous thrombosis) (HCC) 11/23/2013   Past Medical History:  Diagnosis Date   Anemia    Clotting disorder (HCC)    Unsure if has ever been worked up, per pet has had 9 clots in the past, last was in 2006, both brothers also have clotting issues   Depression    Past Surgical History:  Procedure Laterality Date   dental implants     Allergies  Allergen Reactions   Codeine    Penicillins    Sulfa Antibiotics    Prior to Admission medications   Medication Sig Start Date End Date Taking? Authorizing Provider  cephALEXin (KEFLEX) 500 MG capsule Take 1 capsule (500 mg total) by mouth 4 (four) times daily. 02/21/15  Gilda Creasehristopher J Pollina, MD  warfarin (COUMADIN) 2.5 MG tablet Take 2 tablets (5 mg total) by mouth daily. 09/26/15   Rolland PorterMark James, MD   Social History   Social History   Marital status: Single    Spouse name: N/A   Number of children: N/A   Years of education: N/A   Occupational History   Not on file.   Social History Main Topics   Smoking status: Current Some Day Smoker    Packs/day: 0.50    Types: E-cigarettes, Cigarettes   Smokeless tobacco: Never Used   Alcohol use Yes     Comment: daily 4 beers   Drug use: No   Sexual activity: Yes   Other Topics Concern   Not on file   Social History Narrative   No narrative on file   Review of Systems  Constitutional: Positive for fatigue. Negative  for chills and fever.  Respiratory: Positive for shortness of breath. Negative for cough and wheezing.   Cardiovascular: Positive for chest pain, palpitations and leg swelling.  Gastrointestinal: Negative for diarrhea, nausea and vomiting.  Musculoskeletal: Positive for myalgias.       Objective:   Physical Exam  Constitutional: He is oriented to person, place, and time. He appears well-developed and well-nourished.  HENT:  Head: Normocephalic and atraumatic.  Eyes: EOM are normal. Pupils are equal, round, and reactive to light.  Neck: No JVD present. Carotid bruit is not present.  Cardiovascular: Normal rate, regular rhythm and normal heart sounds.   No murmur heard. Left leg: cap refill <1 second, palpable DP pulse but faint with overlying swelling  Pulmonary/Chest: Effort normal and breath sounds normal. He has no rales.  Abdominal: There is tenderness (diffuse) in the left lower quadrant.  Genitourinary:  Genitourinary Comments: Tender over left inguinal fold  Musculoskeletal: He exhibits no edema.  Diffuse swelling of his entire left leg, retracts with light touch, diffusely tender on the medial left thigh, diffuse tenderness on the left calf  Neurological: He is alert and oriented to person, place, and time.  Skin: Skin is warm and dry.  Psychiatric: He has a normal mood and affect.  Vitals reviewed.   Vitals:   10/04/15 1354  BP: 114/60  Pulse: (!) 56  Resp: 18  Temp: 98 F (36.7 C)  TempSrc: Oral  SpO2: 97%  Weight: 170 lb (77.1 kg)  Height: 5' 11.5" (1.816 m)   [2:32 PM] Patient declined EMS transport. Risks of current symptoms and possible PE, progression of symptoms discussed. [2:38 PM] Charge nurse at [facility] was advised.      Assessment & Plan:     Craig Hahn is a 55 y.o. male Acute deep vein thrombosis (DVT) of proximal vein of left lower extremity (HCC)  LLQ abdominal pain  Chest pain, unspecified chest pain type  Dyspnea  Heart  palpitations  History of recurrent DVT, with history of nonadherence to follow-up for monitoring when followed here in the past.  Now with new DVT noted 8 days ago. Most likely cause of most recent recurrence was medication nonadherence. Now on Coumadin for 8 days after initial Lovenox injection for significant DVT found in the ER.   He has had progression of pain in the left leg as well as increased swelling,  now with pain into the groin and left lower quadrant of his abdomen, and intermittent chest pains, heart fluttering, and shortness of breath since ER visit. Concern for progression of thrombus and possible PE, less likely  but still possible thrombosis in abdomen. Discussed EMS transport, but due to financial reasons he declined and plans to drive himself to ER after leaving our office. Charge nurse in ER advised.   No orders of the defined types were placed in this encounter.  Patient Instructions   Unfortunately with your increased leg swelling, pain into the groin and abdomen, chest pain, heart fluttering, and shortness of breath, I'm concerned that the blood clot in your leg has possibly moved up to the groin, abdomen, or into your lungs. You need further evaluation and possible hospitalization today. Go directly to the emergency room after leaving our office. I did advise the charge nurse that you are on the way there. If follow-up needed after the hospital or ER visit, return here if needed to discuss plan for coumadin.    If any change in your symptoms on the way to the emergency room, pull over and call 911.    IF you received an x-ray today, you will receive an invoice from [facility]. Please contact [facility] at [phone] with questions or concerns regarding your invoice.   IF you received labwork today, you will receive an invoice from [employer]. Please contact [facility] at [phone] with questions or concerns regarding your  invoice.   Our billing staff will not be able to assist you with questions regarding bills from these companies.  You will be contacted with the lab results as soon as they are available. The fastest way to get your results is to activate your My Chart account. Instructions are located on the last page of this paperwork. If you have not heard from [place] regarding the results in 2 weeks, please contact this office.       I personally performed the services described in this documentation, which was scribed in my presence. The recorded information has been reviewed and considered, and addended by me as needed.   Signed,   Meredith Staggers, MD [facility].  10/04/15 3:20 PM

## 2015-10-04 NOTE — ED Provider Notes (Signed)
Pending CT angio chest and abdomen, r/o clots H/O PE with now known DVT in thigh He stopped coumadin in March 2017 in favor of homeopathics Here because his boss made him come  Symptoms of chest pain, SOB "feels like the leg clot is moving into abdomen" Back on Coumadin on 8/4, current INR 2.5  Pending CT's - admit if clot burden is growing.  Patient found to have segmental clot in RLL. VSS. He is therapeutic on Coumadin and intends to continue taking it as prescribed. Consideration toward admission to the hospital. Discussed with Dr. Maryfrances Bunnellanford who has seen and evaluated the patient and finds him stable for discharge home with close follow up with primary care ([facility] for recheck INR. Strict return precautions discussed by the hospitalist, who has provided a formal consultation.   Patient discharged to home.   Elpidio AnisShari Norah Fick, PA-C 10/05/15 [id number]    Pricilla LovelessScott Goldston, MD 10/06/15 [phone]

## 2015-10-04 NOTE — Patient Instructions (Addendum)
Unfortunately with your increased leg swelling, pain into the groin and abdomen, chest pain, heart fluttering, and shortness of breath, I'm concerned that the blood clot in your leg has possibly moved up to the groin, abdomen, or into your lungs. You need further evaluation and possible hospitalization today. Go directly to the emergency room after leaving our office. I did advise the charge nurse that you are on the way there. If follow-up needed after the hospital or ER visit, return here if needed to discuss plan for coumadin.    If any change in your symptoms on the way to the emergency room, pull over and call 911.    IF you received an x-ray today, you will receive an invoice from [facility]. Please contact [facility] at [phone] with questions or concerns regarding your invoice.   IF you received labwork today, you will receive an invoice from [facility]. Please contact [facility] at [phone] with questions or concerns regarding your invoice.   Our billing staff will not be able to assist you with questions regarding bills from these companies.  You will be contacted with the lab results as soon as they are available. The fastest way to get your results is to activate your My Chart account. Instructions are located on the last page of this paperwork. If you have not heard from us regarding the results in 2 weeks, please contact this office.

## 2015-10-04 NOTE — ED Notes (Signed)
Patient transported to CT 

## 2015-10-04 NOTE — ED Notes (Signed)
PA at bedside.

## 2015-10-04 NOTE — ED Notes (Signed)
PA made aware pt is refusing CT

## 2015-10-05 DIAGNOSIS — I2699 Other pulmonary embolism without acute cor pulmonale: Secondary | ICD-10-CM

## 2015-10-05 NOTE — Consult Note (Signed)
Hospitalist Service Medical Consultation   Craig Hahn  WUJ:[id number]  DOB: November 19, 1960  DOA: 10/04/2015  PCP: No PCP Per Patient sees Maudie Mercury regularly  Outpatient Specialists: None   Requesting physician: Elpidio Anis, PA-C, Pricilla Loveless, MD  Reason for consultation: PE   History of Present Illness: Craig Hahn is an 55 y.o. 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  It appears that several months ago back in February, the patient was tired of frequent INR checks and came off warfarin.  He reports also that he started to see a naturopath who was treating him with chlorophyll and nattokinase and red yeast rice, etc (and he was paying >$150/month for these supplements) around that time too, and he sort of implies that these medicines were to replace his blood thinner, although he really doesn't know which one was supposed to do that (none of them appear to).  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  He came here to the ER 7 days ago, found to have DVT in the "left common femoral, profunda femoral, superficial femoral, popliteal and peroneal veins".  He had no chest pain, hypoxia, was given one dose Lovenox, started on warfarin and discharged.  Since then he has been taking warfarin 5 mg daily.  He saw [facility] Friday and reported to them intermittent chest pain, "heart flutters" or "buzz" and intermittent shortness of breath, and so he was sent to the ER for concern for PE.  ED course: -Afebrile, heart rate 50s and 60s, respirations normal, pulse oximetry normal, BP 130/74 mmHg -Na 141, K 4.6, Cr 1.0, WBC 6.3, Hgb 15, INR 2.5, troponin negative -ECG sinus bradycardia, no ischemic changes, no  heart strain -CT angiogram of the chest abdomen and pelvis shows a right lower lobe segmental PE, no heart strain, incidental 5.8 mm pulmonary nodule  The patient denied chest pain, dyspnea, shortness of breath with exertion, pleuritic pain, syncope, hemoptysis.  He described a URI 2 months ago and lingering cough since then.  He smokes daily.  He described an odd "fluttering" or "buzzing" feeling over his heart/left chest occasionally recently.       Review of Systems:  All systems were reviewed and were negative to me except as noted in HPI above.  Past Medical History: Past Medical History:  Diagnosis Date  . Anemia   . Clotting disorder (HCC)    Unsure if has ever been worked up, per pet has had 9 clots in the past, last was in 2006, both brothers also have clotting issues  . Depression     Past Surgical History: Past Surgical History:  Procedure Laterality Date  . dental implants       Allergies:   Allergies  Allergen Reactions  . Codeine Other (See Comments)    +synthetic codeine---pancreatitis.   Marland Kitchen Penicillins Rash    Has patient had a PCN reaction causing immediate rash, facial/tongue/throat swelling, SOB or lightheadedness with hypotension: no Has patient had a PCN reaction causing severe rash involving mucus membranes or skin necrosis:yes rash  Has patient had a PCN reaction that required hospitalization  no Has patient had a PCN reaction occurring within the last 10 years: yes If all of the above answers are "NO", then may proceed with Cephalosporin use.   . Sulfa Antibiotics Rash   No current facility-administered medications for this encounter.   Current Outpatient Prescriptions:  .  Ascorbic Acid (VITAMIN C PO), Take 1 tablet by mouth 3 (three) times daily., Disp: , Rfl:  .  Calcium-Magnesium-Vitamin D (CALCIUM MAGNESIUM PO), Take 2 tablets by mouth daily., Disp: , Rfl:  .  Capsicum, Cayenne, (CAYENNE PEPPER PO), Take 1 tablet by mouth daily., Disp: , Rfl:   .  CHLOROPHYLL PO, Take 1 tablet by mouth daily., Disp: , Rfl:  .  NATTOKINASE PO, Take 1 tablet by mouth daily., Disp: , Rfl:  .  NON FORMULARY, Take 1 tablet by mouth daily. Cyruta Plus, Disp: , Rfl:  .  omega-3 acid ethyl esters (LOVAZA) 1 g capsule, Take 2 g by mouth daily., Disp: , Rfl:  .  TURMERIC PO, Take 1 tablet by mouth daily., Disp: , Rfl:  .  warfarin (COUMADIN) 2.5 MG tablet, Take 2 tablets (5 mg total) by mouth daily., Disp: 90 tablet, Rfl: 0     Social History:  reports that he has been smoking E-cigarettes and Cigarettes.  He has been smoking about 0.50 packs per day. He has never used smokeless tobacco. He reports that he drinks alcohol. He reports that he does not use drugs.   Family History: Family History  Problem Relation Age of Onset  . Heart attack Father   . Clotting disorder Brother   . Clotting disorder Brother      Physical Exam: Vitals:   10/04/15 2345 10/05/15 0030 10/05/15 0100 10/05/15 0208  BP: 148/94 143/74 140/88 126/71  Pulse: (!) 41 (!) 42 (!) 38 (!) 51  Resp: 17 16 14 14   Temp:    97.8 F (36.6 C)  TempSrc:    Oral  SpO2: 97% 97% 98% 96%    Constitutional: Thin adult man, Alert and awake, oriented x3, not in any acute distress. Eyes: PERLA, EOMI, irises appear normal, anicteric sclera,  ENMT: external ears and nose appear normal, hearing normal            Lips appears normal, oropharynx mucosa, tongue, posterior pharynx appear normal  Neck: neck appears normal, soft old mass on left posterior neck, normal ROM, no thyromegaly, no JVD  CVS: S1-S2 clear, no murmur rubs or gallops, normal pedal pulses, the left leg is red and swollen relative to right  Respiratory:  clear to auscultation bilaterally, coarse breath sounds bilaterally. Respiratory effort normal. No accessory muscle use.  GI: soft nontender, nondistended, normal bowel sounds, no hepatosplenomegaly, no hernias  Musculoskeletal: no cyanosis, clubbing or edema noted  bilaterally Neuro: Cranial nerves II-XII intact, strength, moves all extremities equally Psych: judgement and insight appear normal, stable mood, affected sad, mental status Skin: no rashes or lesions or ulcers, no induration or nodules    Data reviewed:  I have personally reviewed following labs and imaging studies Labs:  CBC:  Recent Labs Lab 10/04/15 1810  WBC 6.3  HGB 15.3  HCT 46.5  MCV 97.1  PLT 229    Basic Metabolic Panel:  Recent Labs Lab 10/04/15 1810  NA 141  K 4.6  CL 108  CO2 29  GLUCOSE 91  BUN 18  CREATININE 0.97  CALCIUM 9.1   GFR Estimated Creatinine Clearance: 92 mL/min (by C-G formula based on SCr of 0.97 mg/dL).  Coagulation profile  Recent Labs Lab 10/04/15 1810  INR 2.51    Urinalysis    Component Value Date/Time   COLORURINE YELLOW 10/04/2015 1930   APPEARANCEUR CLOUDY (A) 10/04/2015 1930   LABSPEC 1.018 10/04/2015 1930   PHURINE 6.0 10/04/2015 1930   GLUCOSEU NEGATIVE 10/04/2015 1930   HGBUR NEGATIVE 10/04/2015 1930   BILIRUBINUR NEGATIVE 10/04/2015 1930   KETONESUR NEGATIVE 10/04/2015 1930   PROTEINUR NEGATIVE 10/04/2015 1930   UROBILINOGEN 0.2 04/07/2013 1852   NITRITE NEGATIVE 10/04/2015 1930   LEUKOCYTESUR TRACE (A) 10/04/2015 1930          Radiological Exams on Admission: Dg Chest 2 View  Result Date: 10/04/2015 CLINICAL DATA:  55 y/o M; history DVT new onset shortness of breath and chest pain. EXAM: CHEST  2 VIEW COMPARISON:  None. FINDINGS: The heart size and mediastinal contours are within normal limits. No consolidation, pneumothorax, or pleural effusion. Linear opacity in left upper lobe is probably scarring or atelectasis. Mild rightward curvature of the thoracic spine. IMPRESSION: No active cardiopulmonary disease. Electronically Signed   By: Mitzi HansenLance  Furusawa-Stratton M.D.   On: 10/04/2015 18:25   Ct Angio Chest Pe W And/or Wo Contrast  Result Date: 10/04/2015 CLINICAL DATA:  55 y/o M; DVT in left leg for 2  weeks presenting with intermittent shortness of breath and chest pain. EXAM: CT ANGIOGRAPHY CHEST CT ANGIOGRAPHY ABDOMEN AND PELVIS WITH CONTRAST TECHNIQUE: Multidetector CT imaging of the chest was performed using the standard protocol during bolus administration of intravenous contrast. Multiplanar CT image reconstructions and MIPs were obtained to evaluate the vascular anatomy. Multidetector CT imaging of the abdomen and pelvis was performed using the standard protocol during bolus administration of intravenous contrast. CONTRAST:  100 cc Isovue 370. COMPARISON:  COMPARISON CT abdomen pelvis 02/21/2015. FINDINGS: CTA CHEST FINDINGS Mediastinum/Lymph Nodes: Filling defect within the right lower lobe posterior basal segment artery (series 4, image 54). No additional pulmonary embolus is identified. Mild calcific aortic atherosclerosis of the arch. 3.9 cm ascending aorta. Normal heart size. No pericardial effusion. Lungs/Pleura: No pulmonary mass, infiltrate, or effusion. Left upper lobe linear opacity may represent atelectasis or scarring. 3 mm left upper lobe nodule (series 4, image 42). 5.8 mm right upper lobe nodule (series 4, image 35). Musculoskeletal: No chest wall mass or suspicious bone lesions identified. CT ABDOMEN and PELVIS FINDINGS Hepatobiliary: No masses or other significant abnormality. Pancreas: No mass, inflammatory changes, or other significant abnormality. Spleen: Within normal limits in size and appearance. Adrenals/Urinary Tract: No masses identified. No evidence of hydronephrosis. Right kidney lower pole 3 mm nonobstructing stone. Stomach/Bowel: No evidence of obstruction, inflammatory process, or abnormal fluid collections. Normal appendix. Vascular/Lymphatic: No lymphadenopathy identified. Moderate calcific aortic atherosclerosis of the infrarenal abdominal aorta. Reproductive: No mass or other significant abnormality. Other: Small right inguinal fat containing hernia. Musculoskeletal:  Degenerative changes of the lumbar spine with prominent lower lumbar facet arthrosis. Review of the MIP images confirms the above findings. IMPRESSION: 1. Right lower lobe segmental pulmonary embolus. 2. 3.9 cm ectatic ascending aorta. 3. **An incidental finding of potential clinical significance has been found. Pulmonary nodules measuring up to 5.8 mm. No follow-up needed if patient is low-risk. Non-contrast chest CT can be considered in 12 months if patient is high-risk. This recommendation follows the consensus statement: Guidelines for Management of Incidental Pulmonary Nodules Detected on CT Images:From the Fleischner Society 2017; published online before print (10.1148/[id number]).** 4. Right kidney nonobstructive nephrolithiasis. 5. Aortic atherosclerosis. These results were called by telephone at  the time of interpretation on 10/04/2015 at 10:35 pm to Dr. Trixie Dredge , who verbally acknowledged these results. Electronically Signed   By: Mitzi Hansen M.D.   On: 10/04/2015 22:35   Ct Angio Abd/pel W And/or Wo Contrast  Result Date: 10/04/2015 CLINICAL DATA:  55 y/o M; DVT in left leg for 2 weeks presenting with intermittent shortness of breath and chest pain. EXAM: CT ANGIOGRAPHY CHEST CT ANGIOGRAPHY ABDOMEN AND PELVIS WITH CONTRAST TECHNIQUE: Multidetector CT imaging of the chest was performed using the standard protocol during bolus administration of intravenous contrast. Multiplanar CT image reconstructions and MIPs were obtained to evaluate the vascular anatomy. Multidetector CT imaging of the abdomen and pelvis was performed using the standard protocol during bolus administration of intravenous contrast. CONTRAST:  100 cc Isovue 370. COMPARISON:  COMPARISON CT abdomen pelvis 02/21/2015. FINDINGS: CTA CHEST FINDINGS Mediastinum/Lymph Nodes: Filling defect within the right lower lobe posterior basal segment artery (series 4, image 54). No additional pulmonary embolus is identified. Mild  calcific aortic atherosclerosis of the arch. 3.9 cm ascending aorta. Normal heart size. No pericardial effusion. Lungs/Pleura: No pulmonary mass, infiltrate, or effusion. Left upper lobe linear opacity may represent atelectasis or scarring. 3 mm left upper lobe nodule (series 4, image 42). 5.8 mm right upper lobe nodule (series 4, image 35). Musculoskeletal: No chest wall mass or suspicious bone lesions identified. CT ABDOMEN and PELVIS FINDINGS Hepatobiliary: No masses or other significant abnormality. Pancreas: No mass, inflammatory changes, or other significant abnormality. Spleen: Within normal limits in size and appearance. Adrenals/Urinary Tract: No masses identified. No evidence of hydronephrosis. Right kidney lower pole 3 mm nonobstructing stone. Stomach/Bowel: No evidence of obstruction, inflammatory process, or abnormal fluid collections. Normal appendix. Vascular/Lymphatic: No lymphadenopathy identified. Moderate calcific aortic atherosclerosis of the infrarenal abdominal aorta. Reproductive: No mass or other significant abnormality. Other: Small right inguinal fat containing hernia. Musculoskeletal: Degenerative changes of the lumbar spine with prominent lower lumbar facet arthrosis. Review of the MIP images confirms the above findings. IMPRESSION: 1. Right lower lobe segmental pulmonary embolus. 2. 3.9 cm ectatic ascending aorta. 3. **An incidental finding of potential clinical significance has been found. Pulmonary nodules measuring up to 5.8 mm. No follow-up needed if patient is low-risk. Non-contrast chest CT can be considered in 12 months if patient is high-risk. This recommendation follows the consensus statement: Guidelines for Management of Incidental Pulmonary Nodules Detected on CT Images:From the Fleischner Society 2017; published online before print (10.1148/[id number]).** 4. Right kidney nonobstructive nephrolithiasis. 5. Aortic atherosclerosis. These results were called by telephone  at the time of interpretation on 10/04/2015 at 10:35 pm to Dr. Trixie Dredge , who verbally acknowledged these results. Electronically Signed   By: Mitzi Hansen M.D.   On: 10/04/2015 22:35    ECG: Independently reviewed. Rate 50, QTc 424, no ischemic changes, no right heart strain.       Impression/Recommendations 1. DVT and PE: Recent recurrent DVT from non-adherence to warfarin.  PE identified today by CT.  Unclear chronicity, but this PE is likely not a failure of warfarin, but occurred before he was therapeutic on warfarin.  Patient hemodynamically stable, normal HR and BP. PESI score 68 (age and gender only), low risk.  Currently without respiratory distress, chest pain, comorbid heart/lung disease, O2 requirement.  Has therapeutic INR, supply of warfarin, and access to INR repeat within 3 4 days.    He does understand that because of his concurrent DVT, there is some increased risk of adverse outcome, and he  prefers outpatient treatment, which I believe is reasonable.  We discussed NOACs, and I would be happy to prescribe this, although he has looked into it and knows it is not affordable right now.  -I emphasized adherence to warfarin -Repeat INR in 4 days -Continue warfarin 5 mg  -Information on [facility] and [facility] given -Return precautions for worsening chest pain, shortness of breath, syncope were given -Smoking cessation encouraged        Thank you for this consultation.      Alberteen Sam M.D. [facility] 10/05/2015, 1:48 AM

## 2015-10-05 NOTE — ED Notes (Signed)
Pt given Malawiturkey sandwich, peanut butter, graham crackers and water.

## 2015-10-27 ENCOUNTER — Encounter (HOSPITAL_COMMUNITY): Payer: Self-pay | Admitting: Emergency Medicine

## 2015-10-27 ENCOUNTER — Emergency Department (HOSPITAL_COMMUNITY)
Admission: EM | Admit: 2015-10-27 | Discharge: 2015-10-27 | Disposition: A | Discharge status: Home / Self Care | Payer: Self-pay | Attending: Emergency Medicine | Admitting: Emergency Medicine

## 2015-10-27 ENCOUNTER — Emergency Department (HOSPITAL_COMMUNITY): Payer: Self-pay

## 2015-10-27 DIAGNOSIS — Z7901 Long term (current) use of anticoagulants: Secondary | ICD-10-CM | POA: Insufficient documentation

## 2015-10-27 DIAGNOSIS — R0600 Dyspnea, unspecified: Secondary | ICD-10-CM

## 2015-10-27 DIAGNOSIS — R079 Chest pain, unspecified: Secondary | ICD-10-CM

## 2015-10-27 DIAGNOSIS — F17210 Nicotine dependence, cigarettes, uncomplicated: Secondary | ICD-10-CM | POA: Insufficient documentation

## 2015-10-27 HISTORY — DX: Acute embolism and thrombosis of unspecified deep veins of unspecified lower extremity: I82.409

## 2015-10-27 LAB — CBC
HCT: 45.8 % (ref 39.0–52.0)
Hemoglobin: 15.5 g/dL (ref 13.0–17.0)
MCH: 32.2 pg (ref 26.0–34.0)
MCHC: 33.8 g/dL (ref 30.0–36.0)
MCV: 95.2 fL (ref 78.0–100.0)
Platelets: 204 10*3/uL (ref 150–400)
RBC: 4.81 MIL/uL (ref 4.22–5.81)
RDW: 15 % (ref 11.5–15.5)
WBC: 6.1 10*3/uL (ref 4.0–10.5)

## 2015-10-27 LAB — BASIC METABOLIC PANEL
Anion gap: 6 (ref 5–15)
BUN: 13 mg/dL (ref 6–20)
CO2: 27 mmol/L (ref 22–32)
Calcium: 9.2 mg/dL (ref 8.9–10.3)
Chloride: 107 mmol/L (ref 101–111)
Creatinine, Ser: 0.92 mg/dL (ref 0.61–1.24)
GFR calc Af Amer: >60 mL/min (ref >60)
GFR calc non Af Amer: >60 mL/min (ref >60)
Glucose, Bld: 112 mg/dL — ABNORMAL HIGH (ref 65–99)
Potassium: 4.1 mmol/L (ref 3.5–5.1)
Sodium: 140 mmol/L (ref 135–145)

## 2015-10-27 LAB — PROTIME-INR
INR: 2.01
Prothrombin Time: 23.1 seconds — ABNORMAL HIGH (ref 11.4–15.2)

## 2015-10-27 LAB — I-STAT TROPONIN, ED: Troponin i, poc: 0 ng/mL (ref 0.00–0.08)

## 2015-10-27 IMAGING — CR DG CHEST 2V
2 series · 2 of 2 positions shown · non-contrast
Comparison: CT chest dated [DATE]

CLINICAL DATA: Shortness of breath

EXAM:
CHEST  2 VIEW

[w chest pa]
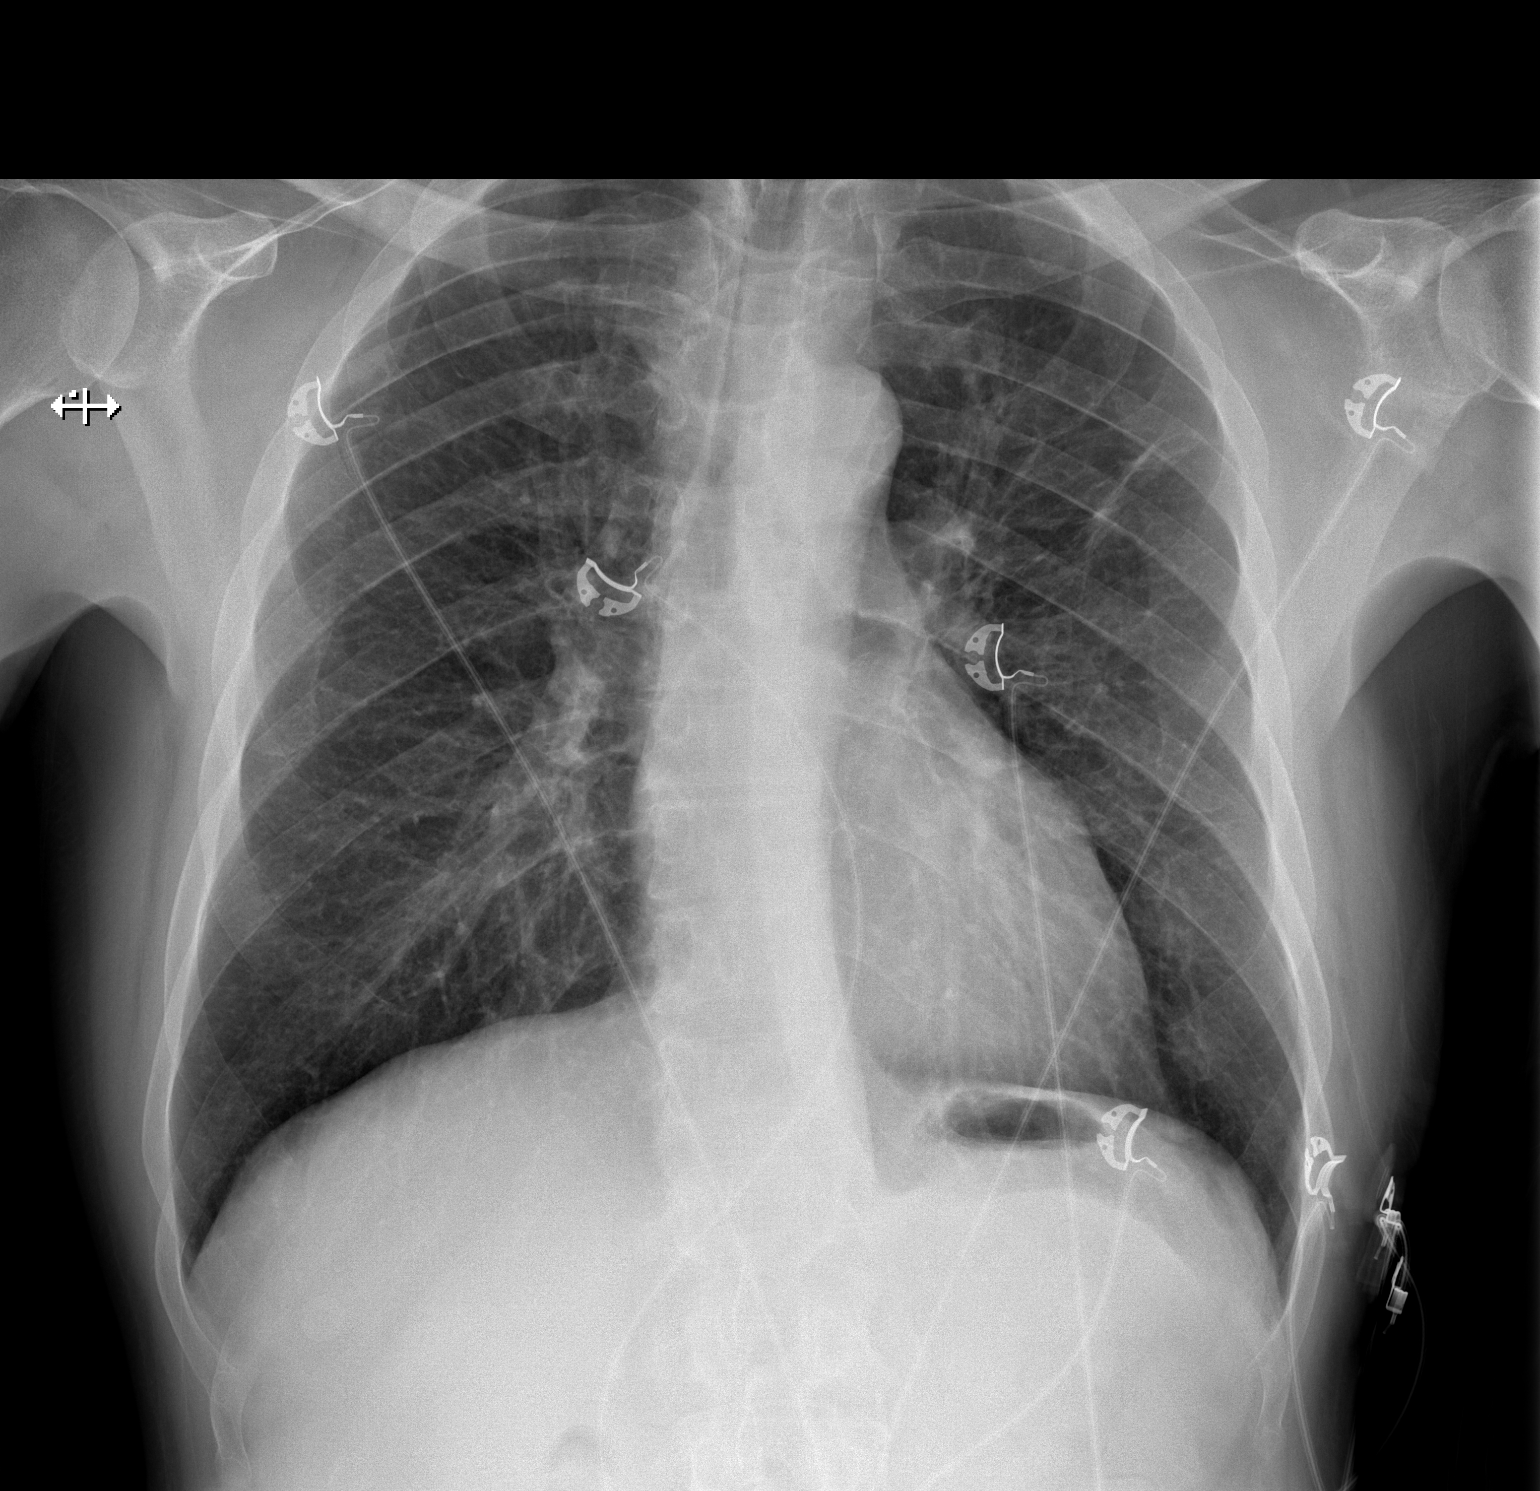

[w chest lat]
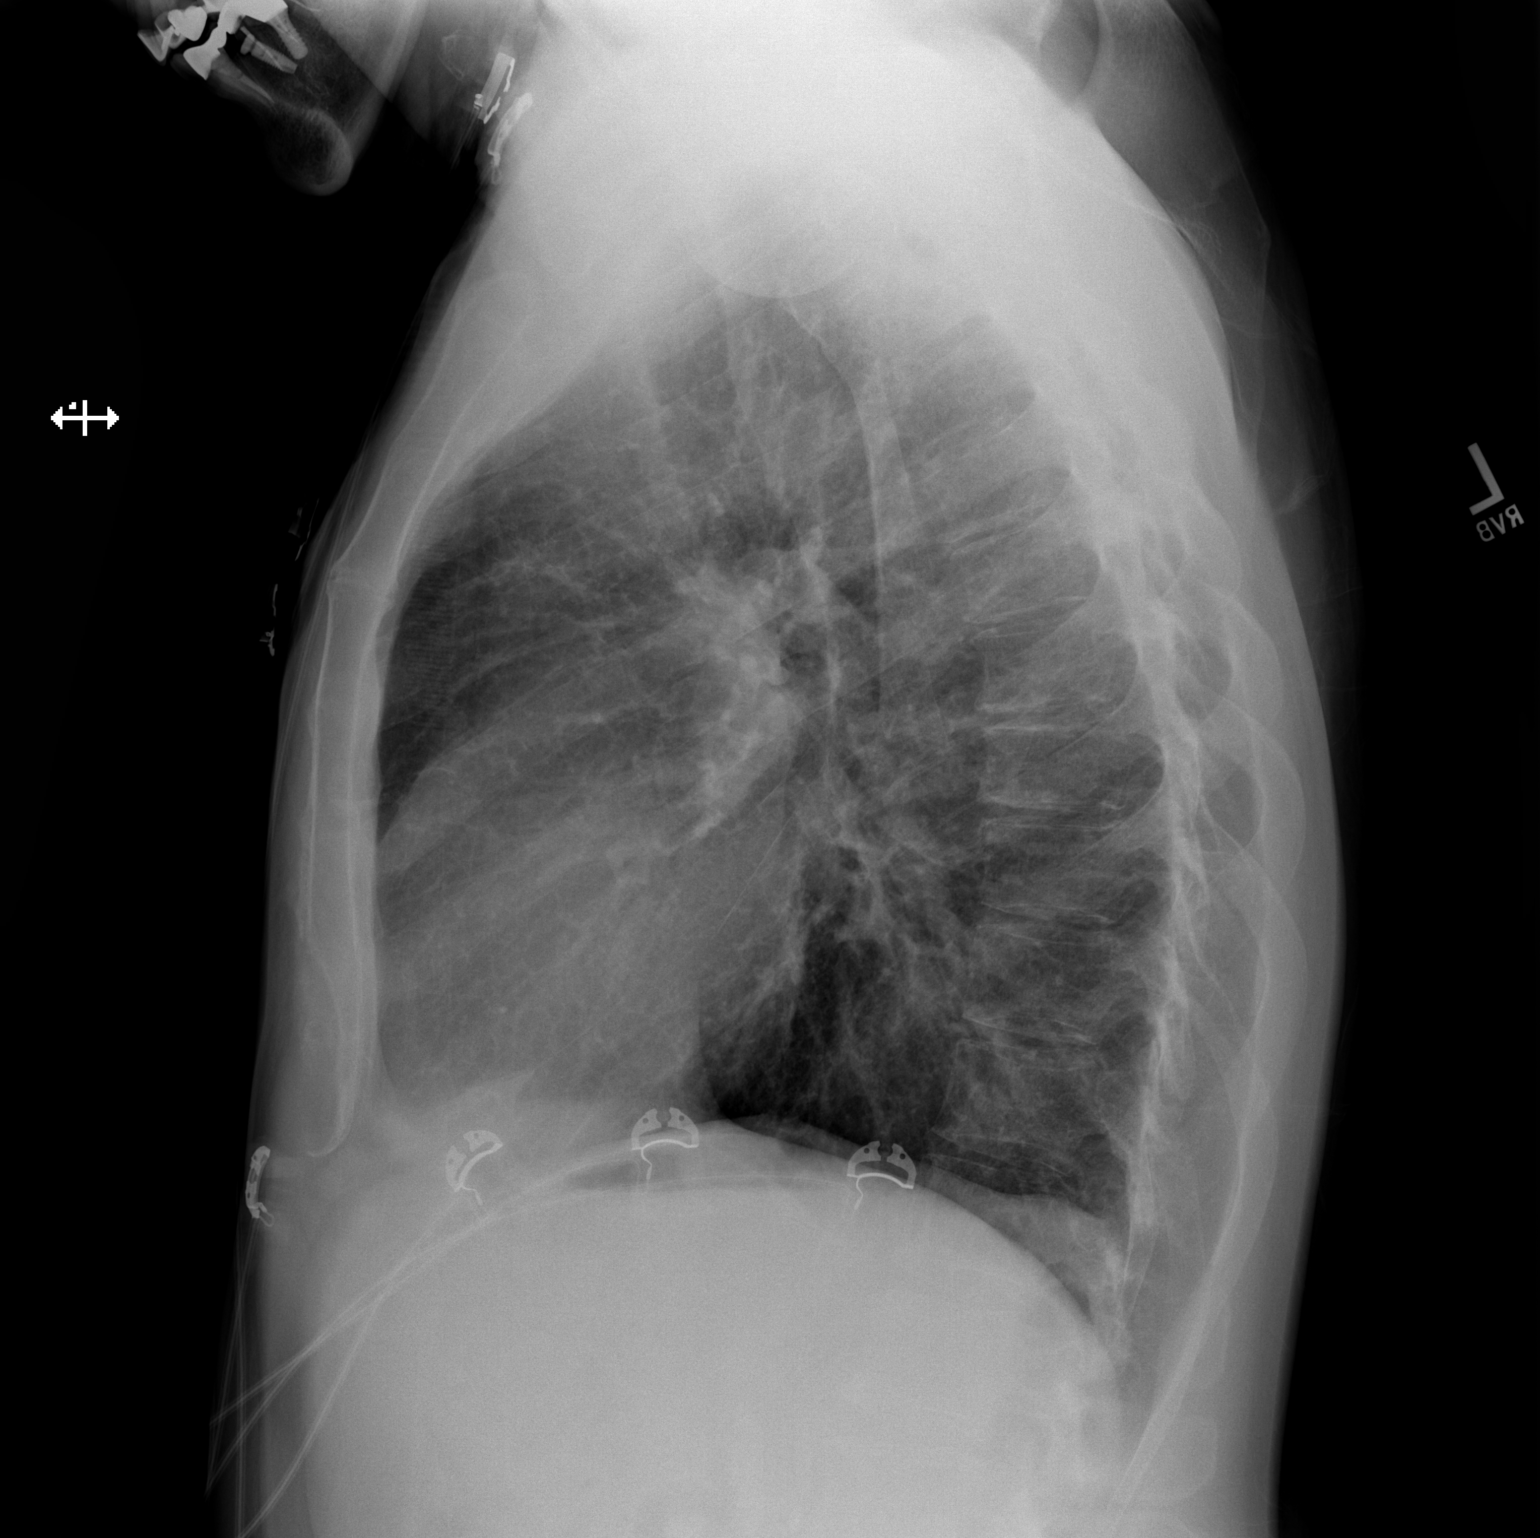

[2 of 2 positions shown; findings below may reference images not displayed]

FINDINGS: Linear scarring in the left upper lobe. No focal consolidation. No
pleural effusion or pneumothorax.

The heart is normal in size.

Visualized osseous structures are within normal limits.
IMPRESSION: No evidence of acute cardiopulmonary disease.

## 2015-10-27 MED ORDER — WARFARIN SODIUM 5 MG PO TABS: 5.0000 mg | ORAL_TABLET | Freq: Every day | ORAL | 1 refills | Status: DC

## 2015-10-27 NOTE — ED Provider Notes (Signed)
[facility]-EMERGENCY DEPT Provider Note   CSN: [id number] Arrival date & time: 10/27/15  1720  By signing my name below, I, Rosario Adie, attest that this documentation has been prepared under the direction and in the presence of Raeford Razor, MD. Electronically Signed: Rosario Adie, ED Scribe. 10/27/15. 6:14 PM.  History   Chief Complaint Chief Complaint  Patient presents with  . Shortness of Breath   The history is provided by the patient and medical records. No language interpreter was used.   HPI Comments: Craig Hahn is a 56 y.o. male with a PMHx of DVT, who presents to the Emergency Department complaining of sudden onset, constant centralized chest pain onset ~4 days ago. Pt reports that his CP was sustained s/p ground-level fall onto a wooden table that occurred ~4 days ago. He states that he may have fell onto something that was on top of the table, but notes that it was dark and he does not remember. During the fall, however, he does report that whatever object was on the table struck the center of his chest, sustaining his pain. No LOC or head injury otherwise. After the fall, he noticed a "jellybean sized lump" to where he struck his chest, which he palpated and "popped" which suddenly disappeared. Since this fall, he reports that he has additionally been constantly SOB. Pt also is c/o a "buzzing" sensation to his left-sided chest which he has had "for months" but notes that it was exacerbated s/p fall. He further notes that he has bilateral leg swelling, but reports that this has been a chronic problem and has been mildly improving recently. His pain is exacerbated with movement and palpation of the area. Pt is currently on Coumadin therapy compliantly since a previously dx'd PE ~1 month ago. Denies fever, or any other associated symptoms.   Past Medical History:  Diagnosis Date  . Anemia   . Clotting disorder (HCC)    Unsure if has ever been worked up, per pet has had  9 clots in the past, last was in 2006, both brothers also have clotting issues  . Depression   . DVT (deep venous thrombosis) [facility])    Patient Active Problem List   Diagnosis Date Noted  . DVT (deep venous thrombosis) (HCC) 11/23/2013   Past Surgical History:  Procedure Laterality Date  . dental implants      Home Medications    Prior to Admission medications   Medication Sig Start Date End Date Taking? Authorizing Provider  Ascorbic Acid (VITAMIN C PO) Take 1 tablet by mouth 3 (three) times daily.    Historical Provider, MD  Calcium-Magnesium-Vitamin D (CALCIUM MAGNESIUM PO) Take 2 tablets by mouth daily.    Historical Provider, MD  Capsicum, Cayenne, (CAYENNE PEPPER PO) Take 1 tablet by mouth daily.    Historical Provider, MD  CHLOROPHYLL PO Take 1 tablet by mouth daily.    Historical Provider, MD  NATTOKINASE PO Take 1 tablet by mouth daily.    Historical Provider, MD  NON FORMULARY Take 1 tablet by mouth daily. Cyruta Plus    Historical Provider, MD  omega-3 acid ethyl esters (LOVAZA) 1 g capsule Take 2 g by mouth daily.    Historical Provider, MD  TURMERIC PO Take 1 tablet by mouth daily.    Historical Provider, MD  warfarin (COUMADIN) 2.5 MG tablet Take 2 tablets (5 mg total) by mouth daily. 09/26/15   Rolland Porter, MD   Family History Family History  Problem Relation  Age of Onset  . Heart attack Father   . Clotting disorder Brother   . Clotting disorder Brother    Social History Social History  Substance Use Topics  . Smoking status: Current Some Day Smoker    Packs/day: 0.50    Types: E-cigarettes, Cigarettes  . Smokeless tobacco: Never Used  . Alcohol use Yes     Comment: daily 4 beers   Allergies   Codeine; Penicillins; and Sulfa antibiotics  Review of Systems Review of Systems  Constitutional: Negative for fever.  Respiratory: Positive for shortness of breath.   Cardiovascular: Positive for chest pain and leg swelling.  All other systems reviewed and are  negative.  Physical Exam Updated Vital Signs BP 107/68 (BP Location: Left Arm)   Pulse (!) 56   Temp 98.2 F (36.8 C) (Oral)   SpO2 100%   Physical Exam  Constitutional: He appears well-developed and well-nourished. No distress.  HENT:  Head: Normocephalic and atraumatic.  Mouth/Throat: Oropharynx is clear and moist. No oropharyngeal exudate.  Eyes: Conjunctivae and EOM are normal. Pupils are equal, round, and reactive to light. Right eye exhibits no discharge. Left eye exhibits no discharge. No scleral icterus.  Neck: Normal range of motion. Neck supple. No JVD present. No thyromegaly present.  Cardiovascular: Normal rate, regular rhythm, normal heart sounds and intact distal pulses.  Exam reveals no gallop and no friction rub.   No murmur heard. Pulmonary/Chest: Effort normal and breath sounds normal. No respiratory distress. He has no wheezes. He has no rales.  Abdominal: Soft. Bowel sounds are normal. He exhibits no distension and no mass. There is no tenderness.  Musculoskeletal: Normal range of motion. He exhibits no edema or tenderness.  Bilateral lower extremitiy edema, left worse than right.   Lymphadenopathy:    He has no cervical adenopathy.  Neurological: He is alert. Coordination normal.  Skin: Skin is warm and dry. No rash noted. No erythema.  Psychiatric: He has a normal mood and affect. His behavior is normal.  Nursing note and vitals reviewed.  ED Treatments / Results  DIAGNOSTIC STUDIES: Oxygen Saturation is 100% on RA, normal by my interpretation.   COORDINATION OF CARE: 6:00 PM-Discussed next steps with pt. Pt verbalized understanding and is agreeable with the plan.   Labs (all labs ordered are listed, but only abnormal results are displayed) Labs Reviewed  BASIC METABOLIC PANEL - Abnormal; Notable for the following:       Result Value   Glucose, Bld 112 (*)    All other components within normal limits  PROTIME-INR - Abnormal; Notable for the  following:    Prothrombin Time 23.1 (*)    All other components within normal limits  CBC  I-STAT TROPOININ, ED   EKG  EKG Interpretation  Date/Time:  Sunday October 27 2015 17:28:07 EDT Ventricular Rate:  59 PR Interval:    QRS Duration: 83 QT Interval:  390 QTC Calculation: 387 R Axis:   32 Text Interpretation:  Sinus rhythm Anteroseptal infarct, old Baseline wander in lead(s) V5 V6 Confirmed by Juleen China  MD, Cayden Granholm [id number]) on 10/27/2015 6:38:25 PM      Radiology Dg Chest 2 View  Result Date: 10/27/2015 CLINICAL DATA:  Shortness of breath EXAM: CHEST  2 VIEW COMPARISON:  CT chest dated 10/04/2015 FINDINGS: Linear scarring in the left upper lobe. No focal consolidation. No pleural effusion or pneumothorax. The heart is normal in size. Visualized osseous structures are within normal limits. IMPRESSION: No evidence of acute cardiopulmonary disease.  Electronically Signed   By: Charline BillsSriyesh  Krishnan M.D.   On: 10/27/2015 17:50   Procedures Procedures   Medications Ordered in ED Medications - No data to display  Initial Impression / Assessment and Plan / ED Course  I have reviewed the triage vital signs and the nursing notes.  Pertinent labs & imaging results that were available during my care of the patient were reviewed by me and considered in my medical decision making (see chart for details).  Clinical Course   55yM with peculiar constellation of symptoms. Doubt ACs, PE, dissection or other emergent process. It has been determined that no acute conditions requiring further emergency intervention are present at this time. The patient has been advised of the diagnosis and plan. I reviewed any labs and imaging including any potential incidental findings. We have discussed signs and symptoms that warrant return to the ED and they are listed in the discharge instructions.    Final Clinical Impressions(s) / ED Diagnoses   Final diagnoses:  Dyspnea  Chest pain, unspecified chest  pain type    New Prescriptions New Prescriptions   No medications on file   I personally preformed the services scribed in my presence. The recorded information has been reviewed is accurate. Raeford RazorStephen Willow Reczek, MD.    Raeford RazorStephen Adian Jablonowski, MD 10/28/15 [phone]

## 2015-10-27 NOTE — ED Triage Notes (Signed)
Pt from home with complaints of SOB since 9/20/ Pt fell and hit a dresser and sustained a bruise in the center of his chest. Pt states his SOB began then, but got worse. Pt states he was diagnosed with a PE on that day. Pt states that in Aug he was diagnosed with a DVT. Pt states he has been on coumadin since then. Pt states his SOB now feels similar to his PE

## 2015-10-29 IMAGING — US US EXTREM LOW VENOUS*L*
1 series · 13 of 24 positions shown · non-contrast
Comparison: None.

CLINICAL DATA: Left lower extremity edema for 2 weeks.



[Series 1: us extrem low venous*left* · 0.08mm/px · 13 of 34 slices shown]
[im 1/34]
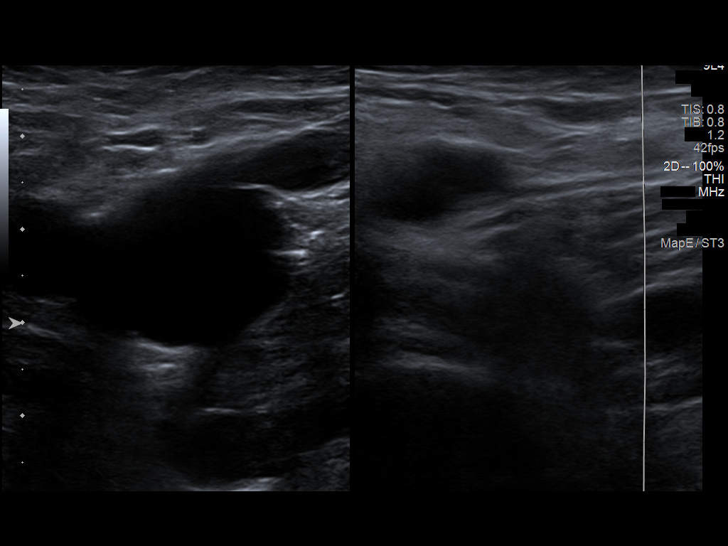
[im 3/34]
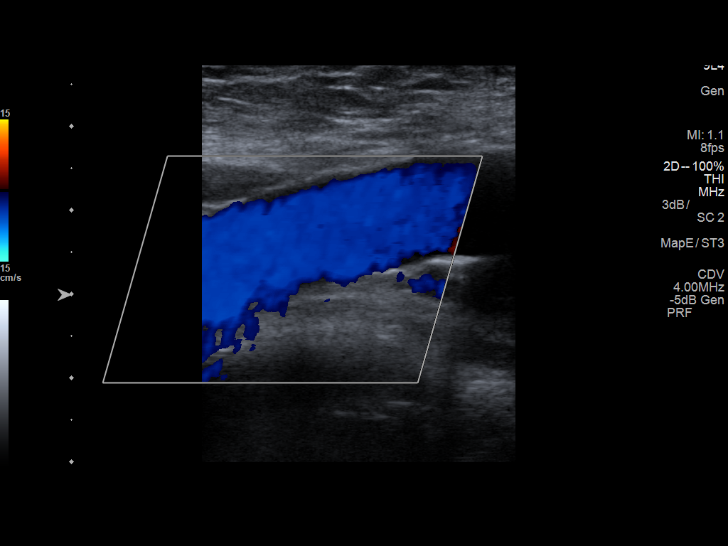
[im 6/34]
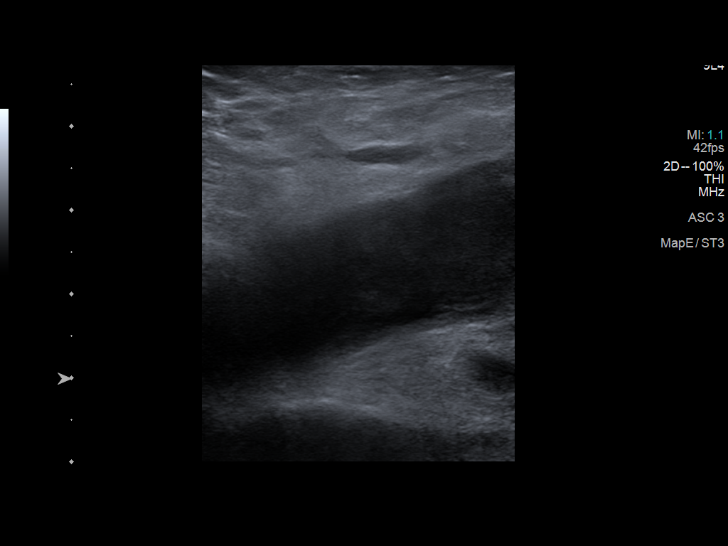
[im 9/34]
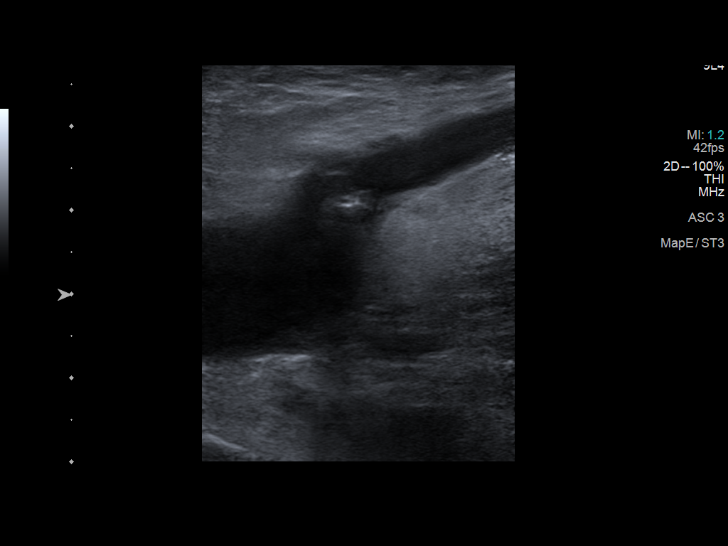
[im 12/34]
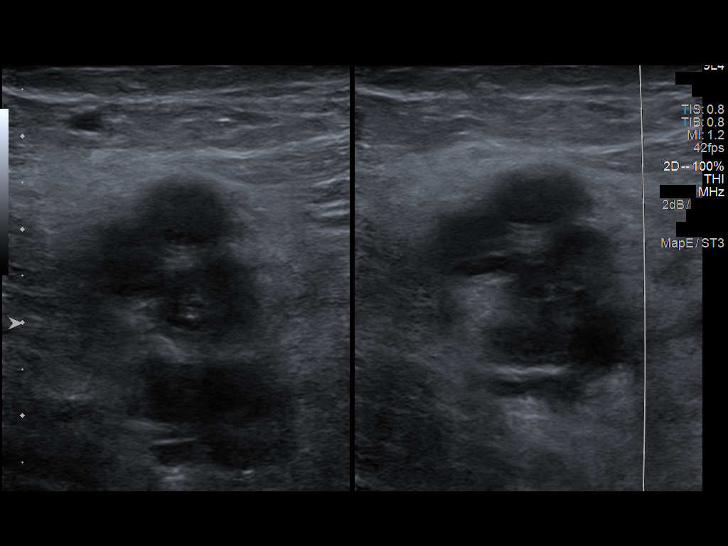
[im 15/34]
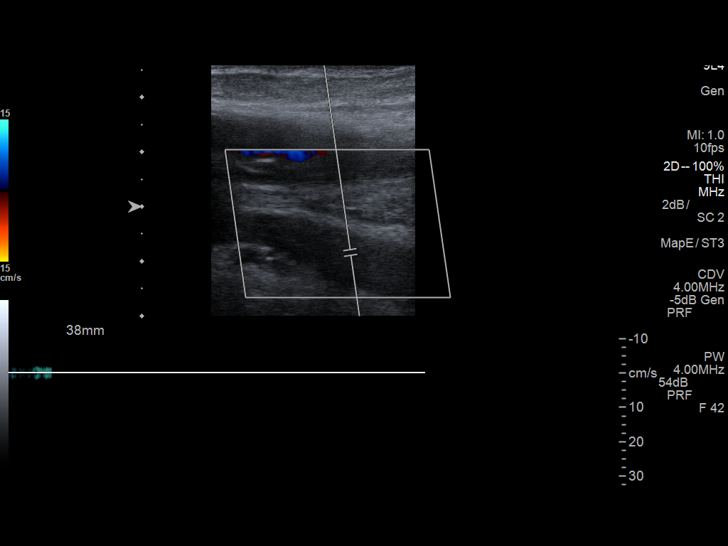
[im 18/34]
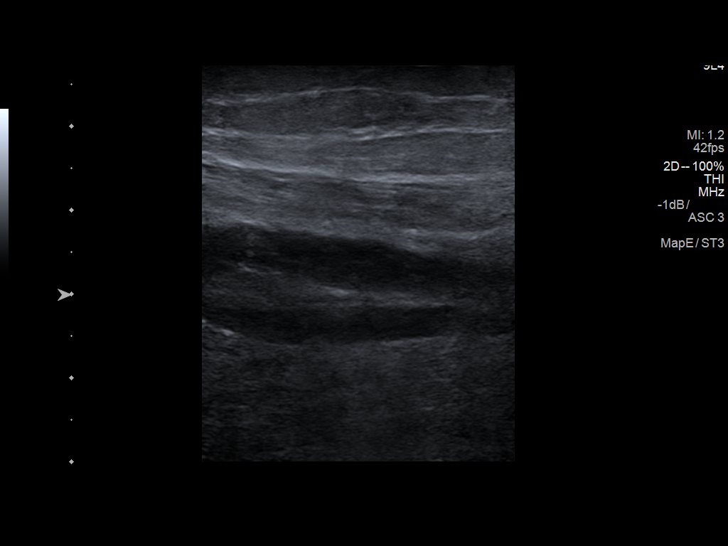
[im 19/34]
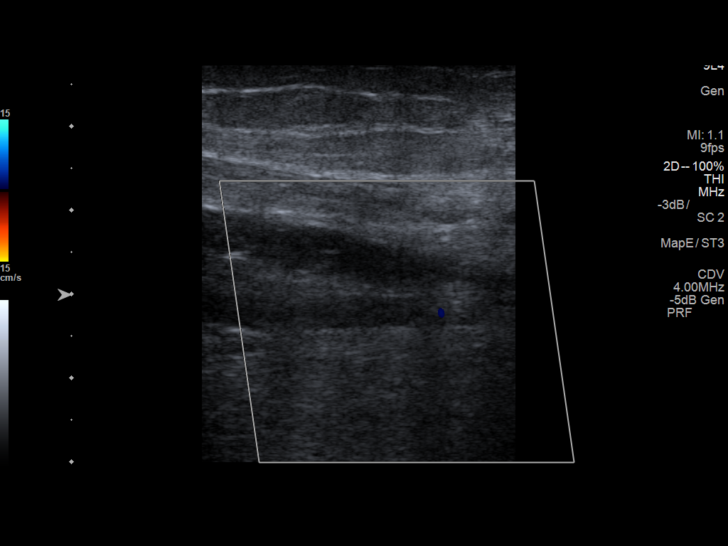
[im 22/34]
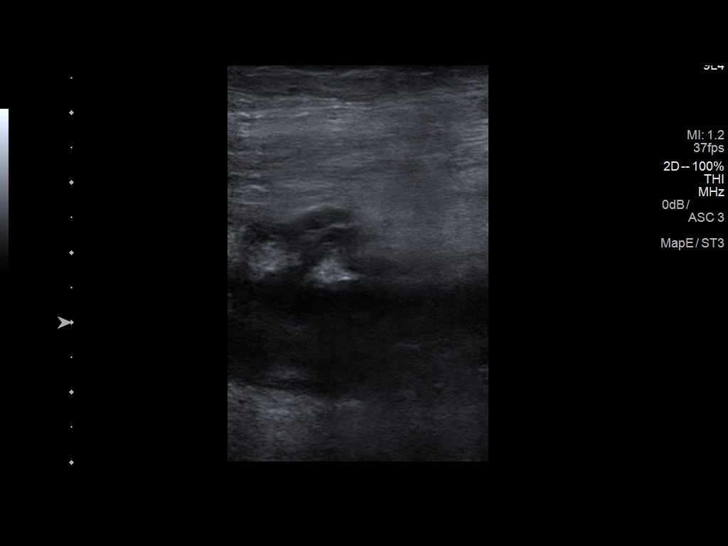
[im 25/34]
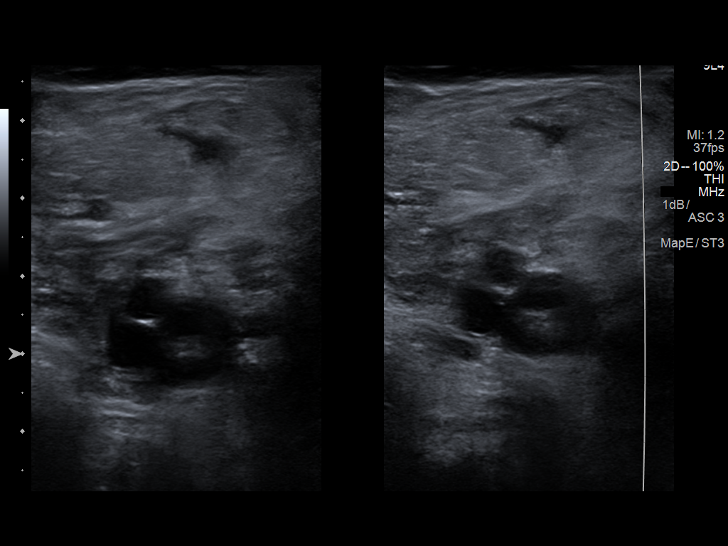
[im 28/34]
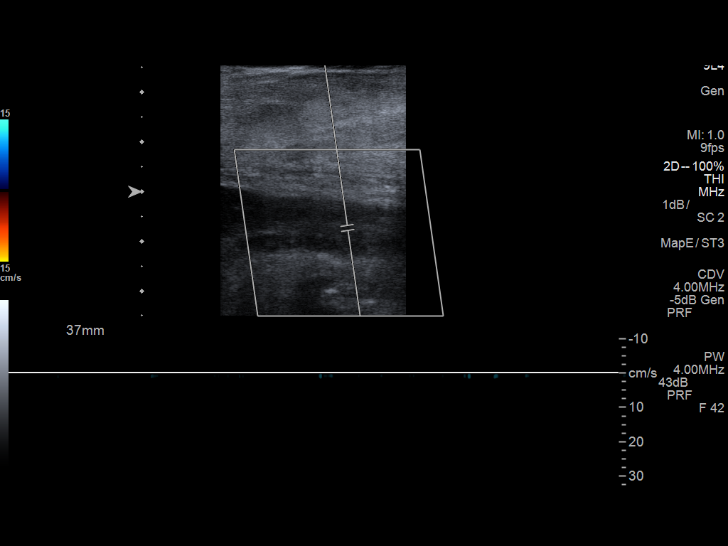
[im 31/34]
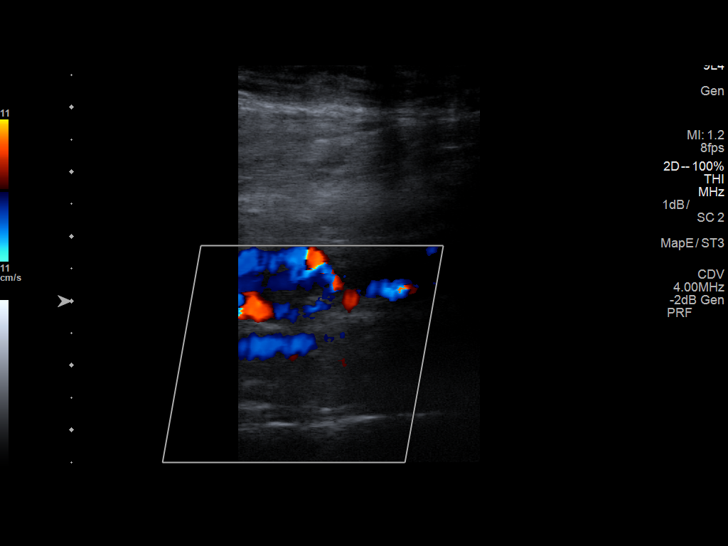
[im 34/34]
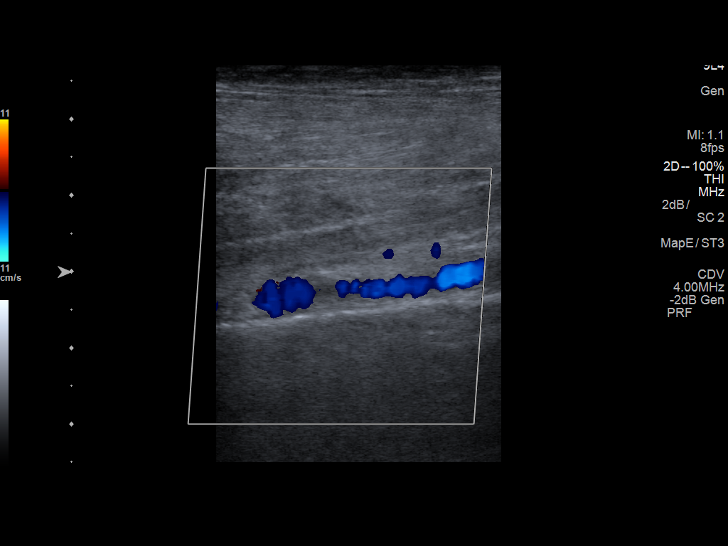

[13 of 24 positions shown; findings below may reference images not displayed]

FINDINGS: Contralateral Common Femoral Vein: Respiratory phasicity is normal
and symmetric with the symptomatic side. No evidence of thrombus.
Normal compressibility.

Common Femoral Vein: Noncompressible with no flow seen on Doppler
consistent with occlusive thrombus.

Saphenofemoral Junction: No evidence of thrombus. Normal
compressibility and flow on color Doppler imaging.

Profunda Femoral Vein: Noncompressible with no flow seen on Doppler
consistent with occlusive thrombus.

Femoral Vein: Noncompressible with no flow consistent with occlusive
thrombus.

Popliteal Vein: Noncompressible with no flow consistent with
occlusive thrombus.

Calf Veins: Posterior tibial vein appears normal. Peroneal veins are
noncompressible with no flow consistent with occlusive thrombus.

Superficial Great Saphenous Vein: No evidence of thrombus. Normal
compressibility and flow on color Doppler imaging.

Venous Reflux:  None.

Other Findings:  None.
IMPRESSION: Acute deep venous thrombosis is seen involving the left common
femoral, profunda femoral, superficial femoral, popliteal and
peroneal veins. The sonographer reports that she discussed these
findings with Dr. PITUFO at [DATE] p.m.

## 2015-11-29 ENCOUNTER — Ambulatory Visit (INDEPENDENT_AMBULATORY_CARE_PROVIDER_SITE_OTHER): Payer: Self-pay | Admitting: Family Medicine

## 2015-11-29 VITALS — BP 144/70 | HR 70 | Temp 98.2°F | Resp 16 | Ht 73.0 in | Wt 171.0 lb

## 2015-11-29 DIAGNOSIS — Z5181 Encounter for therapeutic drug level monitoring: Secondary | ICD-10-CM

## 2015-11-29 DIAGNOSIS — Z7901 Long term (current) use of anticoagulants: Secondary | ICD-10-CM

## 2015-11-29 DIAGNOSIS — J029 Acute pharyngitis, unspecified: Secondary | ICD-10-CM

## 2015-11-29 NOTE — Patient Instructions (Addendum)
I will refill your coumadin upon receipt of your PT-INR in case the dose has to be increased.  I will also refer you for endoscopy for further evaluation of throat problems.  They will be able to give you information regarding the cost of endoscopy.      IF you received an x-ray today, you will receive an invoice from [facility]. Please contact [facility] at [phone] with questions or concerns regarding your invoice.   IF you received labwork today, you will receive an invoice from [facility]. Please contact [facility] at [phone] with questions or concerns regarding your invoice.   Our billing staff will not be able to assist you with questions regarding bills from these companies.  You will be contacted with the lab results as soon as they are available. The fastest way to get your results is to activate your My Chart account. Instructions are located on the last page of this paperwork. If you have not heard from us regarding the results in 2 weeks, please contact this office.

## 2015-11-29 NOTE — Progress Notes (Signed)
Patient ID: Craig Hahn, male    DOB: 1961-01-26, 55 y.o.   MRN: [id number]  PCP: No PCP Per Patient  Chief Complaint  Patient presents with  . Follow-up    labwork   . Sore Throat    x 2 weeks     Subjective:   HPI 55 year old, male presents for a medication refill of coumadin and evaluation of sore throat times 2 weeks. Patient is [facility] to [facility]. He reports that he has only 5 days worth of coumadin remaining and presents today to have his PT/INR checked and have his Coumadin refilled. He reports his last dose adjustment was increased to 5 mg 1 month ago. He reports that he has no medical coverage and therefore is not monitored at Coumadin clinic. Reports that he has called several clinics and has been denied acceptance due to no insurance. He also reports that he's attempted to obtain patient financial assistance through pharmaceutical companies that offer anticoagulation therapy that doesn't require frequent INR monitoring and was denied for unknown reasons. Denies seeking medical coverage options through department of social services. Reports his last hospitalization related to blood clots were  09/26/2015 and 10/04/2015 after he had stopped taking Coumadin for several months. During the 8/24 visit, he was restarted on Coumadin. Today he denies any recent chest pain, bleeding of gums, hematuria, or tarry stools.   Sore Throat  Complains of the sensation of something stuck in his throat for about 2.5 weeks. He reports increasing difficulty in swallowing. Denies epigastric burning, coughing, or hoarseness. Several years ago has a few incident of acid reflux, no symptoms in years.  Concerned as he is a current everyday smoker that has smoked for several years.  Social History   Social History  . Marital status: Single    Spouse name: N/A  . Number of children: N/A  . Years of education: N/A   Occupational History  . Not on file.   Social History Main Topics  . Smoking status:  Current Some Day Smoker    Packs/day: 0.50    Types: E-cigarettes, Cigarettes  . Smokeless tobacco: Never Used  . Alcohol use Yes     Comment: daily 4 beers  . Drug use: No  . Sexual activity: Yes   Other Topics Concern  . Not on file   Social History Narrative  . No narrative on file    Family History  Problem Relation Age of Onset  . Heart attack Father   . Clotting disorder Brother   . Clotting disorder Brother     Review of Systems See HPI  Patient Active Problem List   Diagnosis Date Noted  . DVT (deep venous thrombosis) (HCC) 11/23/2013     Prior to Admission medications   Medication Sig Start Date End Date Taking? Authorizing Provider  Ascorbic Acid (VITAMIN C PO) Take 1 tablet by mouth 3 (three) times daily.   Yes Historical Provider, MD  Calcium-Magnesium-Vitamin D (CALCIUM MAGNESIUM PO) Take 2 tablets by mouth daily.   Yes Historical Provider, MD  omega-3 acid ethyl esters (LOVAZA) 1 g capsule Take 2 g by mouth daily.   Yes Historical Provider, MD  OVER THE COUNTER MEDICATION Take 1 capsule by mouth daily. "Calmzyme."   Yes Historical Provider, MD  warfarin (COUMADIN) 5 MG tablet Take 1 tablet (5 mg total) by mouth daily. 10/27/15  Yes Raeford Razor, MD     Allergies  Allergen Reactions  . Codeine Other (See Comments)    +  synthetic codeine---pancreatitis.   Marland Kitchen. Penicillins Rash    Has patient had a PCN reaction causing immediate rash, facial/tongue/throat swelling, SOB or lightheadedness with hypotension: no Has patient had a PCN reaction causing severe rash involving mucus membranes or skin necrosis:yes rash  Has patient had a PCN reaction that required hospitalization no Has patient had a PCN reaction occurring within the last 10 years: yes If all of the above answers are "NO", then may proceed with Cephalosporin use.   . Sulfa Antibiotics Rash    Objective:  Physical Exam  Constitutional: He is oriented to person, place, and time. He appears  well-developed and well-nourished.  HENT:  Head: Normocephalic and atraumatic.  Right Ear: External ear normal.  Left Ear: External ear normal.  Eyes: Pupils are equal, round, and reactive to light.  Neck: Normal range of motion and phonation normal. Neck supple. No tracheal tenderness and no muscular tenderness present. Carotid bruit is not present. No neck rigidity. No tracheal deviation, no edema and normal range of motion present. No thyroid mass and no thyromegaly present.  Cardiovascular: Normal rate, regular rhythm, normal heart sounds and intact distal pulses.   Pulmonary/Chest: Effort normal and breath sounds normal.  Musculoskeletal: Normal range of motion.  Neurological: He is alert and oriented to person, place, and time.  Skin: Skin is warm and dry. No erythema.  Psychiatric: He has a normal mood and affect. His behavior is normal.  In discussing resources available to improve access to care, patient is resistant to applying for resources and  makes excuses for not following-up on referrals made to him via the ED and by other healthcare providers.     Vitals:   11/29/15 1356  BP: (!) 144/70  Pulse: 70  Resp: 16  Temp: 98.2 F (36.8 C)   Assessment & Plan:  .1. Anticoagulated on Coumadin - Protime-INR, 1.5 less than goal of 2.5 Plan: Increase Coumadin (Warfarin) from 5 mg to 5.5 mg daily. Return in 10 days after taking increase dose for repeat PT/INR. Patient counseled on the importance of monitoring levels as prescribed in order to prevent adverse reactions of medication and or development of another embolic event. Advised patient that I will obtain some recommended resources and provide this information to him in writing via mail.  2. Sore throat - Ambulatory referral to ENT -Advised that I am uncertain of cost although, I highly recommend that he follow-up with ENT for a possible endoscopic evaluation of throat.  Patient will need to return in 10-14 days for PT/  INR recheck.  Godfrey PickKimberly S. Tiburcio PeaHarris, MSN, FNP-C [facility]

## 2015-11-30 LAB — PROTIME-INR
INR: 1.5 — AB
PROTHROMBIN TIME: 15.2 s — AB (ref 9.0–11.5)

## 2015-12-01 ENCOUNTER — Telehealth: Payer: Self-pay | Admitting: Family Medicine

## 2015-12-01 MED ORDER — WARFARIN SODIUM 5 MG PO TABS: 5.0000 mg | ORAL_TABLET | Freq: Every day | ORAL | 0 refills | Status: DC

## 2015-12-01 MED ORDER — WARFARIN SODIUM 1 MG PO TABS: 0.5000 mg | ORAL_TABLET | Freq: Every day | ORAL | 0 refills | Status: DC

## 2015-12-01 NOTE — Telephone Encounter (Signed)
Craig Hahn,  Craig Mccreedyse contact this patient to advise based on his INR of 1.5, which is subtherapeutic, goal is 2.5 for DVT prophylaxis, I have increased his dose of Coumadin from 5 mg to 5.5 mg. He will have to medication to pick up in order to achieve this dosage. Also he was only provided enough medication for 14 days as his INR will need to be rechecked after he has taken the increased dose for at least 7 days.  He will need to return anywhere between 10-14 days for a recheck.  Preferably earlier than 14 days as I will need to know the results of INR to determine next dosage of Coumadin. Advise the patient I will be mailing him a letter with available resources for him to reach out to in order to obtain assistance as he has no insurance.  Thanks,  Craig CourtsKimberly Danny Yackley, FNP-C

## 2015-12-01 NOTE — Addendum Note (Signed)
Addended by: Bing NeighborsHARRIS, Ranbir Chew S on: 12/01/2015 03:31 PM   Modules accepted: Orders

## 2015-12-02 MED ORDER — WARFARIN SODIUM 1 MG PO TABS: 0.5000 mg | ORAL_TABLET | Freq: Every day | ORAL | 0 refills | Status: DC

## 2015-12-02 MED ORDER — WARFARIN SODIUM 5 MG PO TABS: 5.0000 mg | ORAL_TABLET | Freq: Every day | ORAL | 0 refills | Status: DC

## 2015-12-02 NOTE — Addendum Note (Signed)
Addended by: Bing NeighborsHARRIS, Arhum Peeples S on: 12/02/2015 08:35 AM   Modules accepted: Orders

## 2015-12-02 NOTE — Telephone Encounter (Signed)
Called pt and discussed new dosage and need for recheck after 10-14 days. Pt verbalized understanding and agreement. He reported that he thinks that his INR was off because he had eaten a lot of greens prior to his last test, but he will follow Kim's instr's.

## 2015-12-12 ENCOUNTER — Ambulatory Visit (INDEPENDENT_AMBULATORY_CARE_PROVIDER_SITE_OTHER): Payer: Self-pay | Admitting: Family Medicine

## 2015-12-12 VITALS — BP 134/82 | HR 72 | Temp 98.3°F | Resp 16 | Ht 74.0 in | Wt 171.0 lb

## 2015-12-12 DIAGNOSIS — Z5181 Encounter for therapeutic drug level monitoring: Secondary | ICD-10-CM

## 2015-12-12 LAB — PROTIME-INR
INR: 2.2 — ABNORMAL HIGH
Prothrombin Time: 23 s — ABNORMAL HIGH (ref 9.0–11.5)

## 2015-12-12 NOTE — Patient Instructions (Signed)
     IF you received an x-ray today, you will receive an invoice from [facility]. Please contact [facility] at [phone] with questions or concerns regarding your invoice.   IF you received labwork today, you will receive an invoice from [facility]. Please contact [facility] at [phone] with questions or concerns regarding your invoice.   Our billing staff will not be able to assist you with questions regarding bills from these companies.  You will be contacted with the lab results as soon as they are available. The fastest way to get your results is to activate your My Chart account. Instructions are located on the last page of this paperwork. If you have not heard from us regarding the results in 2 weeks, please contact this office.      

## 2015-12-12 NOTE — Progress Notes (Addendum)
   Craig Hahn is a 55 y.o. male who presents to [facility] today for INR check for Coumadin:  History of DVTs and PE's.  Has alternated between being super- and supra-therapeutic over the past few years, with mutliple recurrent DVTs  Patient refused any examination today.  "Just want coumadin checked."  Discussed we're not equipped to do frequent INR's here, and offered resources for Coumadin clinics.    Patient very angry about having to wait and not having his INR checked immediately on presentation.   History of reported 17 clots over his lifetime.   Objective: Gen:  Awake and alert, no distress  Imp/Plan: 1.  Therapeutic monitoring: - needs to establish with regular coumadin clinic for weekly INRs until he is therapeutic.  - high risk for re-clotting, falling and bleeding, etc.   - will send in 2 weeks worth of Coumadin AFTER his labs return and make any adjustments needed.   **Addendum:   - INR within normal limits. Patient on 5.5 mg daily for past 2 weeks.  Refilled his coumadin at current dose.  Needs to find outpatient Coumadin clinic

## 2015-12-16 MED ORDER — WARFARIN SODIUM 5 MG PO TABS: 5.0000 mg | ORAL_TABLET | Freq: Every day | ORAL | 0 refills | Status: DC

## 2015-12-16 MED ORDER — WARFARIN SODIUM 1 MG PO TABS: 0.5000 mg | ORAL_TABLET | Freq: Every day | ORAL | 0 refills | Status: DC

## 2015-12-16 NOTE — Addendum Note (Signed)
Addended byGwendolyn Grant: Volney Reierson H on: 12/16/2015 12:21 PM   Modules accepted: Orders

## 2015-12-17 ENCOUNTER — Other Ambulatory Visit: Payer: Self-pay | Admitting: Family Medicine

## 2015-12-17 NOTE — Progress Notes (Signed)
Dear Craig LlamasPaul S Hahn,    The following are a few financial resources that may be of aide you in getting assistance with medication and healthcare.  Please contact the following to obtain information on how to apply and if you are eligible for any these programs.     Financial [phone] [facility] [phone]  I have also attached information for medication assistance for Eliquis and Xarelto which are blood thinners that will not require such frequent blood monitoring . Please remember to return to the office for you lab work and refill.     Sincerely,      Craig PickKimberly S. Tiburcio PeaHarris, MSN, FNP-C [facility]

## 2015-12-20 DIAGNOSIS — K219 Gastro-esophageal reflux disease without esophagitis: Secondary | ICD-10-CM | POA: Insufficient documentation

## 2015-12-20 DIAGNOSIS — F1010 Alcohol abuse, uncomplicated: Secondary | ICD-10-CM | POA: Insufficient documentation

## 2015-12-30 ENCOUNTER — Telehealth: Payer: Self-pay

## 2015-12-30 ENCOUNTER — Telehealth: Payer: Self-pay | Admitting: Emergency Medicine

## 2015-12-30 NOTE — Telephone Encounter (Signed)
Dr. Gwendolyn GrantWalden,  Patient called requesting medication refill Coumadin. Ran out yesterday. States he is in the process of getting new insurance and will only need refill for the next two weeks. Advised, we will give this last refill but he will need to contact Coumadin clinics for outpatient management.  Dr. Gwendolyn GrantWalden please advise

## 2015-12-30 NOTE — Telephone Encounter (Signed)
Pt is completely out of his blood thinner and needs it called in for today to [facility] on [address]   Best number [phone]

## 2015-12-31 ENCOUNTER — Other Ambulatory Visit: Payer: Self-pay | Admitting: Family Medicine

## 2015-12-31 DIAGNOSIS — Z86718 Personal history of other venous thrombosis and embolism: Secondary | ICD-10-CM

## 2015-12-31 MED ORDER — WARFARIN SODIUM 1 MG PO TABS: 0.5000 mg | ORAL_TABLET | Freq: Every day | ORAL | 0 refills | Status: DC

## 2015-12-31 MED ORDER — WARFARIN SODIUM 5 MG PO TABS: 5.0000 mg | ORAL_TABLET | Freq: Every day | ORAL | 0 refills | Status: DC

## 2015-12-31 NOTE — Progress Notes (Unsigned)
INR WNL at last visit.  Previously controlled in 2 - 3 range while on same 5.5 mg daily dose.    Will refill for only 2 weeks.  Patient without insurance.  Will need to be refilled at Coumadin clinic.  Should ensure this is established PRIOR to running out of his medications.  I will put in an order for him to have his INR checked to ensure we're not running high -- he can have this checked without having to pay to see a physician.

## 2016-01-04 NOTE — Telephone Encounter (Signed)
  Per Dr Gwendolyn GrantWalden  INR WNL at last visit.  Previously controlled in 2 - 3 range while on same 5.5 mg daily dose.    Will refill for only 2 weeks.  Patient without insurance.  Will need to be refilled at Coumadin clinic.  Should ensure this is established PRIOR to running out of his medications.  I will put in an order for him to have his INR checked to ensure we're not running high -- he can have this checked without having to pay to see a physician.   Patient states he now has insurance and has an appointment next week with his new physician. He appreciates the 2 week supply and sounded good on the phone, states he is doing much better financially and will now be able to take better care of himself.  To Dr Lawana PaiWalden FYI

## 2016-01-04 NOTE — Telephone Encounter (Signed)
Thank you for the update.  This is great news

## 2016-01-06 ENCOUNTER — Ambulatory Visit (INDEPENDENT_AMBULATORY_CARE_PROVIDER_SITE_OTHER): Payer: Self-pay | Admitting: Internal Medicine

## 2016-01-06 ENCOUNTER — Encounter: Payer: Self-pay | Admitting: Internal Medicine

## 2016-01-06 VITALS — BP 132/80 | HR 74 | Resp 12 | Ht 69.5 in | Wt 165.0 lb

## 2016-01-06 DIAGNOSIS — I82409 Acute embolism and thrombosis of unspecified deep veins of unspecified lower extremity: Secondary | ICD-10-CM

## 2016-01-06 DIAGNOSIS — K219 Gastro-esophageal reflux disease without esophagitis: Secondary | ICD-10-CM

## 2016-01-06 DIAGNOSIS — F32A Depression, unspecified: Secondary | ICD-10-CM

## 2016-01-06 DIAGNOSIS — F329 Major depressive disorder, single episode, unspecified: Secondary | ICD-10-CM

## 2016-01-06 MED ORDER — WARFARIN SODIUM 5 MG PO TABS: 5.0000 mg | ORAL_TABLET | Freq: Every day | ORAL | 2 refills | Status: DC

## 2016-01-06 MED ORDER — WARFARIN SODIUM 1 MG PO TABS: 1.0000 mg | ORAL_TABLET | Freq: Every day | ORAL | 2 refills | Status: DC

## 2016-01-06 NOTE — Progress Notes (Addendum)
Subjective:    Patient ID: Craig Hahn, male    DOB: August 28, 1960, 55 y.o.   MRN: [id number]  HPI   Here to establish, but will not continue here as now making good money and does not live in neighborhood. Not clear why he is here today--finally states he needs a Coumadin Clinic.  1.  Recurrent DVTs/PEs:  Has not had a protime since Thanksgiving.  Last checked 12/12/2015 at INR level of 2.2 Has been on Coumadin 6 mg daily since day after Thanksgiving.  Appears he was taking 5.5 mg at the time, not clear why he is taking 6 mg Last DVT was from August to October with PE as he was off Coumadin.    States stopped smoking with hypnotism, but is working with someone who does smoke a lot.  Having a hard time not going back.     2.  Depression:  Over a breakup. 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  Goes from one topic to another. Not clear he has had a manic episode.  Difficulty getting him to focus.    3.  GERD:  [facility]: Not clear he is following advice of ENT with diet, alcohol.  Not elevating head of bed.   Current Meds  Medication Sig  . Ascorbic Acid (VITAMIN C PO) Take 1 tablet by mouth 3 (three) times daily.  Marland Kitchen. OVER THE COUNTER MEDICATION Take 1 capsule by mouth daily. "Calmzyme."  . warfarin (COUMADIN) 1 MG tablet Take 0.5 tablets (0.5 mg total) by mouth daily. Take 1/2 tablet with 5 mg tablet for total dose of 5.5 mg (Patient taking differently: Take 1 mg by mouth daily. Take 1 tablet with 5 mg tablet for total dose of 6 mg)  . warfarin (COUMADIN) 5 MG tablet Take 1 tablet (5 mg total) by mouth daily. Take 5mg  with 1/2 tablet for total dose 5.5 mg (Patient taking differently: Take 5 mg by mouth daily. Take 5mg  with 1mg  tablet for total dose 6 mg)   Allergies  Allergen Reactions  . Codeine Other (See Comments)    +synthetic codeine---pancreatitis.   Marland Kitchen. Penicillins Rash    Has patient had a PCN reaction  causing immediate rash, facial/tongue/throat swelling, SOB or lightheadedness with hypotension: no Has patient had a PCN reaction causing severe rash involving mucus membranes or skin necrosis:yes rash  Has patient had a PCN reaction that required hospitalization no Has patient had a PCN reaction occurring within the last 10 years: yes If all of the above answers are "NO", then may proceed with Cephalosporin use.   . Sulfa Antibiotics Rash       Review of Systems     Objective:   Physical Exam  NAD  Lungs:  CTA CV:  RRR with normal S1 and S2, No S3, S4 or murmur.  Radial pulses normal and equal Abd:  S, NT, No HSM or mass, + BS        Assessment & Plan:  1.  Recurrent DVT and PE:  Patient on and off meds for many reasons.  Has dealt with this for many years.   [place]. He is not going to be staying with us at this clinic.  Called [facility] and they have a coumadin clinic associated with their [place] [facility] office--information given. Patient requests refills of his two Coumadin prescriptions today--filled with 2 refills.  2.  GERD:  Recommend compliance with ENT recommendations.  3.  Depression:  Not clear he has simply depression.  Possibly Bipolar.  Encouraged follow up for this, though he feels he can refer to a new book out and "self cure" by following recommendations in the book.

## 2016-01-06 NOTE — Patient Instructions (Signed)
Recommend establishing with [facility] located on [address].  They have a Coumadin clinic there:  [phone] [place]  Recommend regular counseling/evaluation to prevent recurrent depressive episodes and possibly mania as well.

## 2016-01-06 NOTE — Addendum Note (Signed)
Addended by: Marcene DuosMULBERRY, Hydie Langan M on: 01/06/2016 03:48 PM   Modules accepted: Orders

## 2016-01-07 LAB — PROTIME-INR
INR: 1.2 (ref 0.8–1.2)
PROTHROMBIN TIME: 12.7 s — AB (ref 9.1–12.0)

## 2016-01-08 NOTE — Progress Notes (Signed)
Spoke with patient. States he has been taking his Coumadin correctly and has not skipped any doses. Informed to increase to 7 mg daily and come in 2 weeks to recheck PT/INR. Patient scheduled lab appointment for 01/20/16 at 9:30 am.

## 2016-01-17 ENCOUNTER — Telehealth: Payer: Self-pay | Admitting: Emergency Medicine

## 2016-01-17 ENCOUNTER — Encounter (HOSPITAL_COMMUNITY): Payer: Self-pay

## 2016-01-17 ENCOUNTER — Emergency Department (HOSPITAL_COMMUNITY)
Admission: EM | Admit: 2016-01-17 | Discharge: 2016-01-17 | Disposition: A | Discharge status: Home / Self Care | Payer: Self-pay | Attending: Emergency Medicine | Admitting: Emergency Medicine

## 2016-01-17 DIAGNOSIS — Z7901 Long term (current) use of anticoagulants: Secondary | ICD-10-CM | POA: Insufficient documentation

## 2016-01-17 DIAGNOSIS — F17210 Nicotine dependence, cigarettes, uncomplicated: Secondary | ICD-10-CM | POA: Insufficient documentation

## 2016-01-17 DIAGNOSIS — Z760 Encounter for issue of repeat prescription: Secondary | ICD-10-CM | POA: Insufficient documentation

## 2016-01-17 LAB — PROTIME-INR
INR: 1.94
PROTHROMBIN TIME: 22.4 s — AB (ref 11.4–15.2)

## 2016-01-17 MED ORDER — WARFARIN SODIUM 2 MG PO TABS: 2.0000 mg | ORAL_TABLET | Freq: Every day | ORAL | 0 refills | Status: DC

## 2016-01-17 MED ORDER — WARFARIN SODIUM 5 MG PO TABS: 5.0000 mg | ORAL_TABLET | Freq: Every day | ORAL | 0 refills | Status: DC

## 2016-01-17 NOTE — ED Triage Notes (Signed)
Pt states he gets seen for his PT/INR labs.  His Md would not do it this time that normally does.  He can't tell me why.  Wants his labs drawn.

## 2016-01-17 NOTE — ED Provider Notes (Signed)
[facility]-EMERGENCY DEPT Provider Note   CSN: [id number] Arrival date & time: 01/17/16  1232     History   Chief Complaint Chief Complaint  Patient presents with  . lab follow up    HPI Craig Hahn is a 55 y.o. male.  HPI Pt presents requesting refill of his coumadin. Requesting INR be drawn. Recently received insurance. Does not have a provider following his INR yet. Will establish with [facility]in clinic.    Past Medical History:  Diagnosis Date  . Allergy   . Anxiety   . Clotting disorder (HCC)    Unsure if has ever been worked up, per pet has had 9 clots in the past, last was in 2006, both brothers also have clotting issues  . Depression   . DVT (deep venous thrombosis) [facility](HCC)     Patient Active Problem List   Diagnosis Date Noted  . DVT (deep venous thrombosis) (HCC) 11/23/2013    Past Surgical History:  Procedure Laterality Date  . dental implants         Home Medications    Prior to Admission medications   Medication Sig Start Date End Date Taking? Authorizing Provider  Ascorbic Acid (VITAMIN C PO) Take 1 tablet by mouth 3 (three) times daily.    Historical Provider, MD  Calcium-Magnesium-Vitamin D (CALCIUM MAGNESIUM PO) Take 2 tablets by mouth daily.    Historical Provider, MD  omega-3 acid ethyl esters (LOVAZA) 1 g capsule Take 2 g by mouth daily.    Historical Provider, MD  OVER THE COUNTER MEDICATION Take 1 capsule by mouth daily. "Calmzyme."    Historical Provider, MD  warfarin (COUMADIN) 1 MG tablet Take 1 tablet (1 mg total) by mouth daily. Take 1 tablet with 5 mg tablet for total dose of 6 mg 01/06/16   Julieanne MansonElizabeth Mulberry, MD  warfarin (COUMADIN) 5 MG tablet Take 1 tablet (5 mg total) by mouth daily. Take 5mg  with 1mg  tablet for total dose 6 mg 01/06/16   Julieanne MansonElizabeth Mulberry, MD    Family History Family History  Problem Relation Age of Onset  . Heart attack Father   . Clotting disorder Brother   . Clotting disorder Brother     Social  History Social History  Substance Use Topics  . Smoking status: Current Some Day Smoker    Packs/day: 0.50    Types: E-cigarettes, Cigarettes  . Smokeless tobacco: Never Used  . Alcohol use Yes     Comment: daily 4 beers     Allergies   Codeine; Penicillins; and Sulfa antibiotics   Review of Systems Review of Systems  All other systems reviewed and are negative.    Physical Exam Updated Vital Signs BP 143/87 (BP Location: Left Arm)   Pulse 75   Temp 98.3 F (36.8 C) (Oral)   Resp 20   Ht 6\' 2"  (1.88 m)   Wt 130 lb 11.2 oz (59.3 kg)   SpO2 98%   BMI 16.78 kg/m   Physical Exam  Constitutional: He is oriented to person, place, and time. He appears well-developed and well-nourished.  HENT:  Head: Normocephalic.  Eyes: EOM are normal.  Neck: Normal range of motion.  Pulmonary/Chest: Effort normal.  Abdominal: He exhibits no distension.  Musculoskeletal: Normal range of motion.  Neurological: He is alert and oriented to person, place, and time.  Psychiatric: He has a normal mood and affect.  Nursing note and vitals reviewed.    ED Treatments / Results  Labs (all labs  ordered are listed, but only abnormal results are displayed) Labs Reviewed  PROTIME-INR    EKG  EKG Interpretation None       Radiology No results found.  Procedures Procedures (including critical care time)  Medications Ordered in ED Medications - No data to display   Initial Impression / Assessment and Plan / ED Course  I have reviewed the triage vital signs and the nursing notes.  Pertinent labs & imaging results that were available during my care of the patient were reviewed by me and considered in my medical decision making (see chart for details).  Clinical Course     Will refill coumadin 7 mg today. Will check INR and call with results  Final Clinical Impressions(s) / ED Diagnoses   Final diagnoses:  None    New Prescriptions New Prescriptions   No medications  on file     Azalia BilisKevin Jayona Mccaig, MD 01/17/16 1259

## 2016-01-17 NOTE — Telephone Encounter (Signed)
Pt walked in to the clinic today wanting PT/ INR drawn for coumadin therapy. Per last note Dr. Gwendolyn GrantWalden agreed to refill medication for 2 weeks and bloodwork with the understanding patient was to find Coumadin clinic for management.  Pt was seen at the [facility] and prescribed Coumadin with f/u PT/ INR on Saturday 01/21/16  Pt states, he is on his way out of town and will no continue seeing Dr. Delrae AlfredMulberry. He now has insurance and will look for new PCP.  Advised to schedule office visit to establish care or go to ER for bloodwork.

## 2016-01-20 ENCOUNTER — Other Ambulatory Visit: Payer: Self-pay

## 2016-02-22 ENCOUNTER — Telehealth: Payer: Self-pay | Admitting: Internal Medicine

## 2016-02-22 MED ORDER — WARFARIN SODIUM 5 MG PO TABS: 5.0000 mg | ORAL_TABLET | Freq: Every day | ORAL | 1 refills | Status: DC

## 2016-02-22 MED ORDER — WARFARIN SODIUM 2 MG PO TABS: 2.0000 mg | ORAL_TABLET | Freq: Every day | ORAL | 1 refills | Status: DC

## 2016-02-22 NOTE — Telephone Encounter (Signed)
Needed refill of Coumadin.   Discussed this is the final refill of Coumadin from our office.  He needs to find another provider as per his description, he makes too much money.

## 2016-03-16 ENCOUNTER — Ambulatory Visit (INDEPENDENT_AMBULATORY_CARE_PROVIDER_SITE_OTHER): Payer: Self-pay | Admitting: Physician Assistant

## 2016-03-16 ENCOUNTER — Encounter: Payer: Self-pay | Admitting: Physician Assistant

## 2016-03-16 VITALS — BP 138/62 | HR 64 | Temp 98.2°F | Resp 14 | Ht 72.0 in | Wt 166.0 lb

## 2016-03-16 DIAGNOSIS — N401 Enlarged prostate with lower urinary tract symptoms: Secondary | ICD-10-CM

## 2016-03-16 DIAGNOSIS — N5312 Painful ejaculation: Secondary | ICD-10-CM

## 2016-03-16 DIAGNOSIS — Z7901 Long term (current) use of anticoagulants: Secondary | ICD-10-CM

## 2016-03-16 DIAGNOSIS — R3911 Hesitancy of micturition: Secondary | ICD-10-CM

## 2016-03-16 DIAGNOSIS — Z23 Encounter for immunization: Secondary | ICD-10-CM

## 2016-03-16 DIAGNOSIS — R300 Dysuria: Secondary | ICD-10-CM

## 2016-03-16 LAB — PROTIME-INR
INR: 2 ratio — AB (ref 0.8–1.0)
Prothrombin Time: 21.9 s — ABNORMAL HIGH (ref 9.6–13.1)

## 2016-03-16 LAB — POCT URINALYSIS DIPSTICK
Bilirubin, UA: NEGATIVE
Glucose, UA: NEGATIVE
Ketones, UA: NEGATIVE
Nitrite, UA: NEGATIVE
PROTEIN UA: NEGATIVE
RBC UA: NEGATIVE
SPEC GRAV UA: 1.02
UROBILINOGEN UA: 0.2
pH, UA: 6

## 2016-03-16 LAB — COMPREHENSIVE METABOLIC PANEL
ALBUMIN: 4.2 g/dL (ref 3.5–5.2)
ALK PHOS: 58 U/L (ref 39–117)
ALT: 17 U/L (ref 0–53)
AST: 20 U/L (ref 0–37)
BILIRUBIN TOTAL: 0.5 mg/dL (ref 0.2–1.2)
BUN: 9 mg/dL (ref 6–23)
CALCIUM: 9.3 mg/dL (ref 8.4–10.5)
CO2: 28 mEq/L (ref 19–32)
CREATININE: 0.88 mg/dL (ref 0.40–1.50)
Chloride: 107 mEq/L (ref 96–112)
GFR: 94.88 mL/min (ref >60.00)
Glucose, Bld: 83 mg/dL (ref 70–99)
Potassium: 5.6 mEq/L — ABNORMAL HIGH (ref 3.5–5.1)
Sodium: 140 mEq/L (ref 135–145)
TOTAL PROTEIN: 7.1 g/dL (ref 6.0–8.3)

## 2016-03-16 LAB — PSA: PSA: 1.31 ng/mL (ref 0.10–4.00)

## 2016-03-16 NOTE — Patient Instructions (Signed)
Please go to the lab for blood work. I will call you with your results. We will alter your coumadin regimen accordingly.   You will get a phone call from the Coumadin Clinic for chronic anticoagulation. You will also get a call from Urology for further assessment.  If you note any acute worsening of symptoms or decreased urinary flow, please go to the ER. Tylenol arthritis if needed for pain.  Please schedule an appointment at earliest convenience for a complete physical.

## 2016-03-16 NOTE — Progress Notes (Signed)
Pre visit review using our clinic review tool, if applicable. No additional management support is needed unless otherwise documented below in the visit note. 

## 2016-03-16 NOTE — Progress Notes (Addendum)
Patient presents to clinic today to establish care.  Pain with urination -- throughout urination over the past month. Endorses now with burning sensation in penis. Denies discharge. Denies fever or chills.  Denies nausea or vomiting. Some suprapubic pressure and discomfort. Some noted urinary hesitancy but denies incomplete bladder emptying. Denies nocturia. Some increased dribbling. Denies family history of prostate cancer. States he has a history of prostate problems but is unable to elaborate. Denies trauma or injury to the area. States he is not currently sexually active but does engage in daily masturbation. Notes significant pain with ejaculation. Denies hematospermia. Notes suprapubic pressure and low back discomfort that has been chronic over the past month.   Patient also with history of Factor V and multiple DVT. Endorses one PE several years ago. Has two brothers with the same issue. Is on chronic coumadin which he has been getting from various [facility]. Is taking 7 mg daily. EMR reviewed. Last INR at 1.9 a month ago. No changes made at that time.   Patient also states that he has not seen a primary provider in some time. He typically goes to a "Doc in a box" when he is sick. Does not get most immunizations and is not interested in screening tests or physicals.   Health Maintenance: Immunizations -- Declines flu shot. Endorses Tetanus are overdue. Will get today.  Colonoscopy -- Declines colonoscopy. States he does not need it and that it is just a waste of money.   Past Medical History:  Diagnosis Date  . Allergy   . Anxiety   . Clotting disorder ([place])    Unsure if has ever been worked up, per pet has had 9 clots in the past, last was in 2006, both brothers also have clotting issues  . Depression   . DVT (deep venous thrombosis) ([place])     Past Surgical History:  Procedure Laterality Date  . dental implants     x8    Current Outpatient Prescriptions on File Prior to  Visit  Medication Sig Dispense Refill  . Ascorbic Acid (VITAMIN C PO) Take 1 tablet by mouth 3 (three) times daily.    . Calcium-Magnesium-Vitamin D (CALCIUM MAGNESIUM PO) Take 2 tablets by mouth daily.    Marland Kitchen warfarin (COUMADIN) 2 MG tablet Take 1 tablet (2 mg total) by mouth daily. 30 tablet 1  . warfarin (COUMADIN) 5 MG tablet Take 1 tablet (5 mg total) by mouth daily. 30 tablet 1  . omega-3 acid ethyl esters (LOVAZA) 1 g capsule Take 2 g by mouth daily.    Marland Kitchen OVER THE COUNTER MEDICATION Take 1 capsule by mouth daily. "Calmzyme."     No current facility-administered medications on file prior to visit.     Allergies  Allergen Reactions  . Codeine Other (See Comments)    +synthetic codeine---pancreatitis.   Marland Kitchen Penicillins Rash    Has patient had a PCN reaction causing immediate rash, facial/tongue/throat swelling, SOB or lightheadedness with hypotension: no Has patient had a PCN reaction causing severe rash involving mucus membranes or skin necrosis:yes rash  Has patient had a PCN reaction that required hospitalization no Has patient had a PCN reaction occurring within the last 10 years: yes If all of the above answers are "NO", then may proceed with Cephalosporin use.   . Sulfa Antibiotics Rash    Family History  Problem Relation Age of Onset  . Heart attack Father   . Clotting disorder Brother   . Clotting disorder Brother  Social History   Social History  . Marital status: Single    Spouse name: N/A  . Number of children: N/A  . Years of education: N/A   Occupational History  . Not on file.   Social History Main Topics  . Smoking status: Current Some Day Smoker    Packs/day: 1.00    Types: E-cigarettes, Cigarettes  . Smokeless tobacco: Never Used  . Alcohol use Yes     Comment: daily 4 beers  . Drug use: No  . Sexual activity: Yes   Other Topics Concern  . Not on file   Social History Narrative  . No narrative on file   Review of Systems    Constitutional: Negative for fever and weight loss.  HENT: Negative for ear discharge, ear pain, hearing loss and tinnitus.   Eyes: Negative for blurred vision, double vision, photophobia and pain.  Respiratory: Negative for cough and shortness of breath.   Cardiovascular: Negative for chest pain and palpitations.  Gastrointestinal: Negative for abdominal pain, blood in stool, constipation, diarrhea, heartburn, melena, nausea and vomiting.  Genitourinary: Positive for dysuria. Negative for flank pain, frequency, hematuria and urgency.       + hesitancy Denies Nocturia.  Musculoskeletal: Negative for falls.  Neurological: Negative for dizziness, loss of consciousness and headaches.  Endo/Heme/Allergies: Negative for environmental allergies.  Psychiatric/Behavioral: Negative for depression, hallucinations, substance abuse and suicidal ideas. The patient is not nervous/anxious and does not have insomnia.    BP 138/62   Pulse 64   Temp 98.2 F (36.8 C) (Oral)   Resp 14   Ht 6' (1.829 m)   Wt 166 lb (75.3 kg)   SpO2 94%   BMI 22.51 kg/m   Physical Exam  Constitutional: He is oriented to person, place, and time and well-developed, well-nourished, and in no distress.  HENT:  Head: Normocephalic and atraumatic.  Eyes: Conjunctivae are normal. Pupils are equal, round, and reactive to light.  Neck: Neck supple. No thyromegaly present.  Cardiovascular: Normal rate, regular rhythm, normal heart sounds and intact distal pulses.   Pulmonary/Chest: Effort normal and breath sounds normal. No respiratory distress. He has no wheezes. He has no rales. He exhibits no tenderness.  Abdominal: Soft. Bowel sounds are normal. He exhibits no distension. There is no tenderness.  Genitourinary: Penis normal. Rectal exam shows no external hemorrhoid and no internal hemorrhoid. Prostate is enlarged. Prostate is not tender.  Genitourinary Comments: Questionable nodule of left posterior lobe of prostate,  adjacent to sulcus. There is some diffuse mild enlargement noted.  Lymphadenopathy:    He has no cervical adenopathy.  Neurological: He is alert and oriented to person, place, and time. No cranial nerve deficit.  Skin: Skin is warm and dry. No rash noted.  Psychiatric: Affect normal.  Vitals reviewed.   Recent Results (from the past 2160 hour(s))  INR/PT     Status: Abnormal   Collection Time: 01/06/16  3:43 PM  Result Value Ref Range   INR 1.2 0.8 - 1.2    Comment: Reference interval is for non-anticoagulated patients. Suggested INR therapeutic range for Vitamin K antagonist therapy:    Standard Dose (moderate intensity                   therapeutic range):       2.0 - 3.0    Higher intensity therapeutic range       2.5 - 3.5    Prothrombin Time 12.7 (H) 9.1 - 12.0 sec  Protime-INR     Status: Abnormal   Collection Time: 01/17/16  1:02 PM  Result Value Ref Range   Prothrombin Time 22.4 (H) 11.4 - 15.2 seconds   INR 1.94    Assessment/Plan: 1. Dysuria Urine dipstick obtained and unremarkable. Urine Culture sent -- Neg. Symptoms are chronic and do not seem infectious. Urinary hesitancy. Denies sexual activity with others but endorses repetitive masturbation that he continues despite pain. Concern for urethral trauma. Also concern for obstruction both in urethra and the prostate. Referral to Urology placed. Addendum -- potassium elevated at 5.6. Repeat obtained 1 week later and increased to 5.8. Asymptomatic from cardiac standpoint. Significant concern for obstructive uropathy. Will try to expedite appointment with Urology.  - Ambulatory referral to Urology - POCT Urinalysis Dipstick - Urine Culture  2. Painful ejaculation Urgent referral to Urology placed. Patient instructed to refrain from masturbation or sexual activity.  - Ambulatory referral to Urology  3. Benign prostatic hyperplasia with urinary hesitancy Enlarged prostate. Questionable nodule. PSA and CMP today.  Referral to Urology placed.  - PSA - Comp Met (CMET) - Ambulatory referral to Urology  4. Chronic anticoagulation Labs today. Referral to Coumadin clinic placed. They will take over management. Will alter regimen for now if indicated by results. - INR/PT  5. Need for Tdap vaccination TDaP given today. - Tdap vaccine greater than or equal to 7yo IM   Leeanne Rio, PA-C

## 2016-03-17 ENCOUNTER — Other Ambulatory Visit: Payer: Self-pay | Admitting: Physician Assistant

## 2016-03-17 DIAGNOSIS — E875 Hyperkalemia: Secondary | ICD-10-CM

## 2016-03-17 DIAGNOSIS — I824Y9 Acute embolism and thrombosis of unspecified deep veins of unspecified proximal lower extremity: Secondary | ICD-10-CM | POA: Insufficient documentation

## 2016-03-17 MED ORDER — WARFARIN SODIUM 5 MG PO TABS: 5.0000 mg | ORAL_TABLET | Freq: Every day | ORAL | 1 refills | Status: DC

## 2016-03-17 MED ORDER — WARFARIN SODIUM 2 MG PO TABS: 2.0000 mg | ORAL_TABLET | Freq: Every day | ORAL | 1 refills | Status: DC

## 2016-03-18 LAB — URINE CULTURE: Organism ID, Bacteria: NO GROWTH

## 2016-03-19 ENCOUNTER — Telehealth: Payer: Self-pay | Admitting: General Practice

## 2016-03-19 NOTE — Telephone Encounter (Signed)
LMOVM for patient to call office to schedule appointment in coumadin clinic @ [facility].

## 2016-03-23 ENCOUNTER — Encounter: Payer: Self-pay | Admitting: Physician Assistant

## 2016-03-23 ENCOUNTER — Other Ambulatory Visit: Payer: Self-pay

## 2016-03-23 ENCOUNTER — Ambulatory Visit (INDEPENDENT_AMBULATORY_CARE_PROVIDER_SITE_OTHER): Payer: Self-pay | Admitting: Physician Assistant

## 2016-03-23 VITALS — Temp 98.0°F | Resp 14 | Ht 72.0 in | Wt 166.0 lb

## 2016-03-23 DIAGNOSIS — M6740 Ganglion, unspecified site: Secondary | ICD-10-CM

## 2016-03-23 DIAGNOSIS — E875 Hyperkalemia: Secondary | ICD-10-CM

## 2016-03-23 LAB — POTASSIUM: Potassium: 5.8 mEq/L — ABNORMAL HIGH (ref 3.5–5.1)

## 2016-03-23 NOTE — Patient Instructions (Signed)
Stay well hydrated. Eat a well-balanced diet.  Do not take more Vitamin C than as directed. Rise slowly from a seated position to prevent orthostatic lightheadedness.  We will repeat potassium today. Will proceed with further assessment if repeat potassium is not improved.  For the finger, this seems consistent with a ganglion cyst. If you want removed, I can set this up for you.    Orthostatic Hypotension Orthostatic hypotension is a sudden drop in blood pressure that happens when you quickly change positions, such as when you get up from a seated or lying position. Blood pressure is a measurement of how strongly, or weakly, your blood is pressing against the walls of your arteries. Arteries are blood vessels that carry blood from your heart throughout your body. When blood pressure is too low, you may not get enough blood to your brain or to the rest of your organs. This can cause weakness, light-headedness, rapid heartbeat, and fainting. This can last for just a few seconds or for up to a few minutes. Orthostatic hypotension is usually not a serious problem. However, if it happens frequently or gets worse, it may be a sign of something more serious. What are the causes? This condition may be caused by:  Sudden changes in posture, such as standing up quickly after you have been sitting or lying down.  Blood loss.  Loss of body fluids (dehydration).  Heart problems.  Hormone (endocrine) problems.  Pregnancy.  Severe infection.  Lack of certain nutrients.  Severe allergic reactions (anaphylaxis).  Certain medicines, such as blood pressure medicine or medicines that make the body lose excess fluids (diuretics). Sometimes, this condition can be caused by not taking medicine as directed, such as taking too much of a certain medicine. What increases the risk? Certain factors can make you more likely to develop orthostatic hypotension, including:  Age. Risk increases as you get  older.  Conditions that affect the heart or the central nervous system.  Taking certain medicines, such as blood pressure medicine or diuretics.  Being pregnant. What are the signs or symptoms? Symptoms of this condition may include:  Weakness.  Light-headedness.  Dizziness.  Blurred vision.  Fatigue.  Rapid heartbeat.  Fainting, in severe cases. How is this diagnosed? This condition is diagnosed based on:  Your medical history.  Your symptoms.  Your blood pressure measurement. Your health care provider will check your blood pressure when you are:  Lying down.  Sitting.  Standing. A blood pressure reading is recorded as two numbers, such as "120 over 80" (or 120/80). The first ("top") number is called the systolic pressure. It is a measure of the pressure in your arteries as your heart beats. The second ("bottom") number is called the diastolic pressure. It is a measure of the pressure in your arteries when your heart relaxes between beats. Blood pressure is measured in a unit called mm Hg. Healthy blood pressure for adults is 120/80. If your blood pressure is below 90/60, you may be diagnosed with hypotension. Other information or tests that may be used to diagnose orthostatic hypotension include:  Your other vital signs, such as your heart rate and temperature.  Blood tests.  Tilt table test. For this test, you will be safely secured to a table that moves you from a lying position to an upright position. Your heart rhythm and blood pressure will be monitored during the test. How is this treated? Treatment for this condition may include:  Changing your diet. This may involve  eating more salt (sodium) or drinking more water.  Taking medicines to raise your blood pressure.  Changing the dosage of certain medicines you are taking that might be lowering your blood pressure.  Wearing compression stockings. These stockings help to prevent blood clots and reduce  swelling in your legs. In some cases, you may need to go to the hospital for:  Fluid replacement. This means you will receive fluids through an IV tube.  Blood replacement. This means you will receive donated blood through an IV tube (transfusion).  Treating an infection or heart problems, if this applies.  Monitoring. You may need to be monitored while medicines that you are taking wear off. Follow these instructions at home: Eating and drinking  Drink enough fluid to keep your urine clear or pale yellow.  Eat a healthy diet and follow instructions from your health care provider about eating or drinking restrictions. A healthy diet includes:  Fresh fruits and vegetables.  Whole grains.  Lean meats.  Low-fat dairy products.  Eat extra salt only as directed. Do not add extra salt to your diet unless your health care provider told you to do that.  Eat frequent, small meals.  Avoid standing up suddenly after eating. Medicines  Take over-the-counter and prescription medicines only as told by your health care provider.  Follow instructions from your health care provider about changing the dosage of your current medicines, if this applies.  Do not stop or adjust any of your medicines on your own. General instructions  Wear compression stockings as told by your health care provider.  Get up slowly from lying down or sitting positions. This gives your blood pressure a chance to adjust.  Avoid hot showers and excessive heat as directed by your health care provider.  Return to your normal activities as told by your health care provider. Ask your health care provider what activities are safe for you.  Do not use any products that contain nicotine or tobacco, such as cigarettes and e-cigarettes. If you need help quitting, ask your health care provider.  Keep all follow-up visits as told by your health care provider. This is important. Contact a health care provider if:  You  vomit.  You have diarrhea.  You have a fever for more than 2-3 days.  You feel more thirsty than usual.  You feel weak and tired. Get help right away if:  You have chest pain.  You have a fast or irregular heartbeat.  You develop numbness in any part of your body.  You cannot move your arms or your legs.  You have trouble speaking.  You become sweaty or feel lightheaded.  You faint.  You feel short of breath.  You have trouble staying awake.  You feel confused. This information is not intended to replace advice given to you by your health care provider. Make sure you discuss any questions you have with your health care provider. Document Released: 01/09/2002 Document Revised: 10/08/2015 Document Reviewed: 07/12/2015 Elsevier Interactive Patient Education  2017 [employer]

## 2016-03-23 NOTE — Progress Notes (Signed)
Patient presents to clinic today for follow-up of hyperkalemia noted on labs last week. Patient was taking excess amounts of OTC Vitamin-C with potassium. Has stopped and taking a regular calcium supplement. Noted some mild lightheadedness with standing last week that has resolved. Patient denies chest pain, palpitations,dizziness, vision changes or frequent headaches. Is hydrating well. Notes some muscle cramps last week that have resolved.  Patient also notes a mass of his L index finger, present for > 1 year but getting larger. 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   Past Medical History:  Diagnosis Date  . Allergy   . Anxiety   . Clotting disorder ([place])    Unsure if has ever been worked up, per pet has had 9 clots in the past, last was in 2006, both brothers also have clotting issues  . Depression   . DVT (deep venous thrombosis) ([place])     Current Outpatient Prescriptions on File Prior to Visit  Medication Sig Dispense Refill  . OVER THE COUNTER MEDICATION Take 1 capsule by mouth daily. "Calmzyme."    . warfarin (COUMADIN) 2 MG tablet Take 1 tablet (2 mg total) by mouth daily. 30 tablet 1  . warfarin (COUMADIN) 5 MG tablet Take 1 tablet (5 mg total) by mouth daily. 30 tablet 1  . Ascorbic Acid (VITAMIN C PO) Take 1 tablet by mouth 3 (three) times daily.    . Calcium-Magnesium-Vitamin D (CALCIUM MAGNESIUM PO) Take 2 tablets by mouth daily.    Marland Kitchen omega-3 acid ethyl esters (LOVAZA) 1 g capsule Take 2 g by mouth daily.     No current facility-administered medications on file prior to visit.     Allergies  Allergen Reactions  . Codeine Other (See Comments)    +synthetic codeine---pancreatitis.   Marland Kitchen Penicillins Rash    Has patient had a PCN reaction causing immediate rash, facial/tongue/throat swelling, SOB or lightheadedness with hypotension: no Has patient had a PCN reaction causing severe rash involving mucus membranes or skin necrosis:yes rash  Has patient had  a PCN reaction that required hospitalization no Has patient had a PCN reaction occurring within the last 10 years: yes If all of the above answers are "NO", then may proceed with Cephalosporin use.   . Sulfa Antibiotics Rash    Family History  Problem Relation Age of Onset  . Heart attack Father   . Clotting disorder Brother   . Clotting disorder Brother     Social History   Social History  . Marital status: Single    Spouse name: N/A  . Number of children: 0  . Years of education: N/A   Occupational History  . Construction    Social History Main Topics  . Smoking status: Current Some Day Smoker    Packs/day: 1.00    Types: E-cigarettes, Cigarettes  . Smokeless tobacco: Never Used  . Alcohol use Yes     Comment: daily 4 beers   . Drug use: No  . Sexual activity: Not Currently    Partners: Female    Birth control/ protection: Condom   Other Topics Concern  . None   Social History Narrative  . None   Review of Systems - See HPI.  All other ROS are negative.  BP 128/78   Pulse 67   Temp 98 F (36.7 C) (Oral)   Resp 14   Ht 6' (1.829 m)   Wt 166 lb (75.3 kg)   SpO2 92%   BMI  22.51 kg/m   Physical Exam  Constitutional: He is oriented to person, place, and time and well-developed, well-nourished, and in no distress.  HENT:  Head: Normocephalic and atraumatic.  Eyes: Conjunctivae are normal.  Neck: Neck supple.  Cardiovascular: Normal rate, regular rhythm, normal heart sounds and intact distal pulses.   Pulmonary/Chest: Effort normal and breath sounds normal. No respiratory distress. He has no wheezes. He has no rales. He exhibits no tenderness.  Neurological: He is alert and oriented to person, place, and time.  Skin: Skin is warm and dry. No rash noted.  Psychiatric: Affect normal.  Vitals reviewed.  Recent Results (from the past 2160 hour(s))  INR/PT     Status: Abnormal   Collection Time: 01/06/16  3:43 PM  Result Value Ref Range   INR 1.2 0.8 -  1.2    Comment: Reference interval is for non-anticoagulated patients. Suggested INR therapeutic range for Vitamin K antagonist therapy:    Standard Dose (moderate intensity                   therapeutic range):       2.0 - 3.0    Higher intensity therapeutic range       2.5 - 3.5    Prothrombin Time 12.7 (H) 9.1 - 12.0 sec  Protime-INR     Status: Abnormal   Collection Time: 01/17/16  1:02 PM  Result Value Ref Range   Prothrombin Time 22.4 (H) 11.4 - 15.2 seconds   INR 1.94   INR/PT     Status: Abnormal   Collection Time: 03/16/16 11:25 AM  Result Value Ref Range   INR 2.0 (H) 0.8 - 1.0 ratio   Prothrombin Time 21.9 (H) 9.6 - 13.1 sec  PSA     Status: None   Collection Time: 03/16/16 11:25 AM  Result Value Ref Range   PSA 1.31 0.10 - 4.00 ng/mL  Comp Met (CMET)     Status: Abnormal   Collection Time: 03/16/16 11:25 AM  Result Value Ref Range   Sodium 140 135 - 145 mEq/L   Potassium 5.6 (H) 3.5 - 5.1 mEq/L   Chloride 107 96 - 112 mEq/L   CO2 28 19 - 32 mEq/L   Glucose, Bld 83 70 - 99 mg/dL   BUN 9 6 - 23 mg/dL   Creatinine, Ser 0.88 0.40 - 1.50 mg/dL   Total Bilirubin 0.5 0.2 - 1.2 mg/dL   Alkaline Phosphatase 58 39 - 117 U/L   AST 20 0 - 37 U/L   ALT 17 0 - 53 U/L   Total Protein 7.1 6.0 - 8.3 g/dL   Albumin 4.2 3.5 - 5.2 g/dL   Calcium 9.3 8.4 - 10.5 mg/dL   GFR 94.88 >60.00 mL/min  POCT Urinalysis Dipstick     Status: Abnormal   Collection Time: 03/16/16 11:50 AM  Result Value Ref Range   Color, UA yellow    Clarity, UA clear    Glucose, UA negative    Bilirubin, UA negative    Ketones, UA negative    Spec Grav, UA 1.020    Blood, UA negative    pH, UA 6.0    Protein, UA negative    Urobilinogen, UA 0.2    Nitrite, UA negative    Leukocytes, UA Trace (A) Negative  Urine Culture     Status: None   Collection Time: 03/16/16 11:56 AM  Result Value Ref Range   Organism ID, Bacteria NO GROWTH  Assessment/Plan: 1. Serum potassium elevated Repeat potassium  level today. Hydration reviewed. Patient has stopped supplementation. Heart RRR. OVS checked with mild decrease in BP. No true orthostasis, though feel his occasional lightheadedness on standing is from orthostasis when not well hydrated. Will follow. - Potassium  2. Ganglion cyst Patient declines being set up for removal. States he will remove himself at home. Advised patient against this due to risk of infection, nerve damage and risk of bleeding due to anticoagulants.   Leeanne Rio, PA-C

## 2016-03-23 NOTE — Progress Notes (Signed)
Pre visit review using our clinic review tool, if applicable. No additional management support is needed unless otherwise documented below in the visit note. 

## 2016-04-07 DIAGNOSIS — Z5181 Encounter for therapeutic drug level monitoring: Secondary | ICD-10-CM | POA: Insufficient documentation

## 2016-04-08 ENCOUNTER — Other Ambulatory Visit: Payer: Self-pay | Admitting: General Practice

## 2016-04-08 ENCOUNTER — Ambulatory Visit (INDEPENDENT_AMBULATORY_CARE_PROVIDER_SITE_OTHER): Payer: Self-pay | Admitting: General Practice

## 2016-04-08 DIAGNOSIS — I824Y9 Acute embolism and thrombosis of unspecified deep veins of unspecified proximal lower extremity: Secondary | ICD-10-CM

## 2016-04-08 DIAGNOSIS — Z5181 Encounter for therapeutic drug level monitoring: Secondary | ICD-10-CM

## 2016-04-08 LAB — POCT INR: INR: 2.1

## 2016-04-08 MED ORDER — WARFARIN SODIUM 5 MG PO TABS: ORAL_TABLET | ORAL | 1 refills | Status: DC

## 2016-04-08 MED ORDER — WARFARIN SODIUM 2 MG PO TABS: ORAL_TABLET | ORAL | 1 refills | Status: DC

## 2016-04-08 NOTE — Patient Instructions (Signed)
Pre visit review using our clinic review tool, if applicable. No additional management support is needed unless otherwise documented below in the visit note. 

## 2016-05-04 ENCOUNTER — Ambulatory Visit (INDEPENDENT_AMBULATORY_CARE_PROVIDER_SITE_OTHER): Payer: Self-pay | Admitting: General Practice

## 2016-05-04 DIAGNOSIS — Z5181 Encounter for therapeutic drug level monitoring: Secondary | ICD-10-CM

## 2016-05-04 DIAGNOSIS — I824Y9 Acute embolism and thrombosis of unspecified deep veins of unspecified proximal lower extremity: Secondary | ICD-10-CM

## 2016-05-04 LAB — POCT INR: INR: 2.2

## 2016-05-04 NOTE — Patient Instructions (Signed)
Pre visit review using our clinic review tool, if applicable. No additional management support is needed unless otherwise documented below in the visit note. 

## 2016-05-04 NOTE — Progress Notes (Signed)
I agree with this plan.

## 2016-05-13 ENCOUNTER — Encounter (HOSPITAL_COMMUNITY): Payer: Self-pay

## 2016-05-13 ENCOUNTER — Emergency Department (HOSPITAL_COMMUNITY): Payer: Self-pay

## 2016-05-13 DIAGNOSIS — Y9389 Activity, other specified: Secondary | ICD-10-CM | POA: Insufficient documentation

## 2016-05-13 DIAGNOSIS — Y9289 Other specified places as the place of occurrence of the external cause: Secondary | ICD-10-CM | POA: Insufficient documentation

## 2016-05-13 DIAGNOSIS — Y999 Unspecified external cause status: Secondary | ICD-10-CM | POA: Insufficient documentation

## 2016-05-13 DIAGNOSIS — M79641 Pain in right hand: Secondary | ICD-10-CM | POA: Insufficient documentation

## 2016-05-13 DIAGNOSIS — W11XXXA Fall on and from ladder, initial encounter: Secondary | ICD-10-CM | POA: Insufficient documentation

## 2016-05-13 DIAGNOSIS — S93402A Sprain of unspecified ligament of left ankle, initial encounter: Secondary | ICD-10-CM | POA: Insufficient documentation

## 2016-05-13 IMAGING — DX DG HAND COMPLETE 3+V*R*
3 series · 3 of 3 positions shown · non-contrast
Comparison: None.

CLINICAL DATA: Status post fall 15 feet off ladder, with right hand
pain and swelling. Initial encounter.

EXAM:
RIGHT HAND - COMPLETE 3+ VIEW

[hand pa]
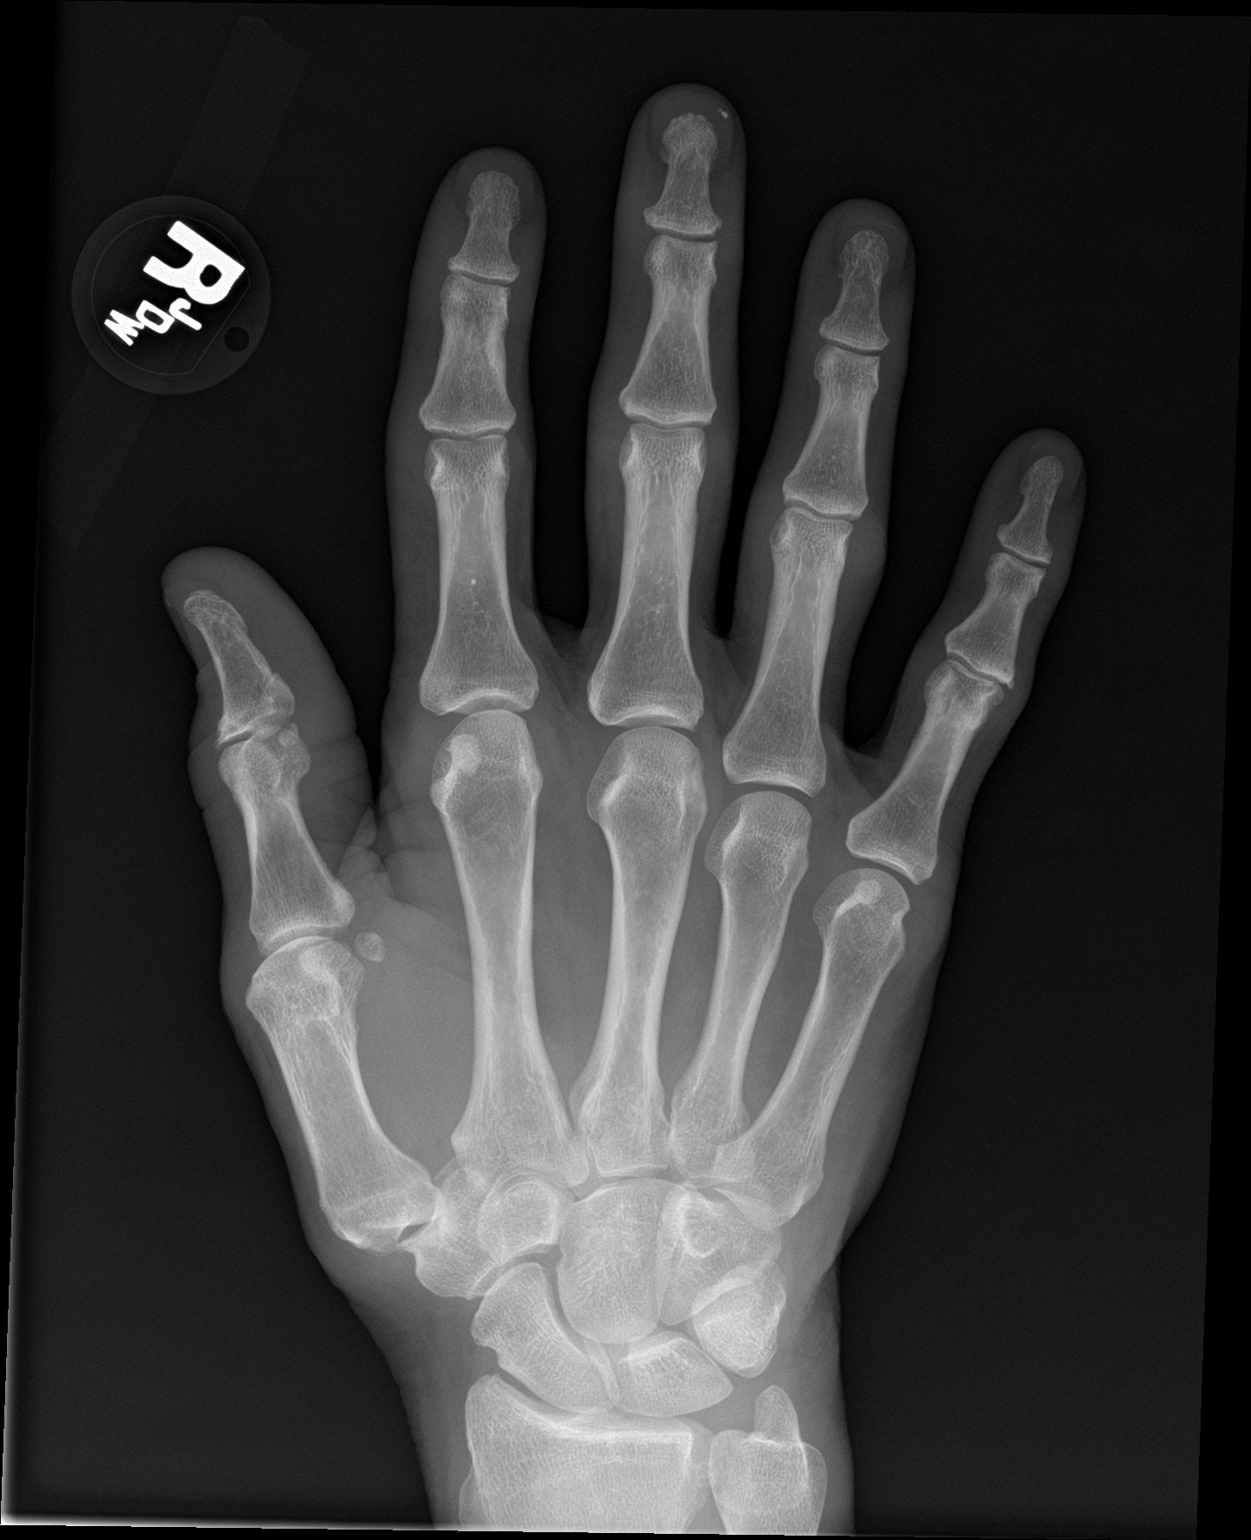

[hand obl]
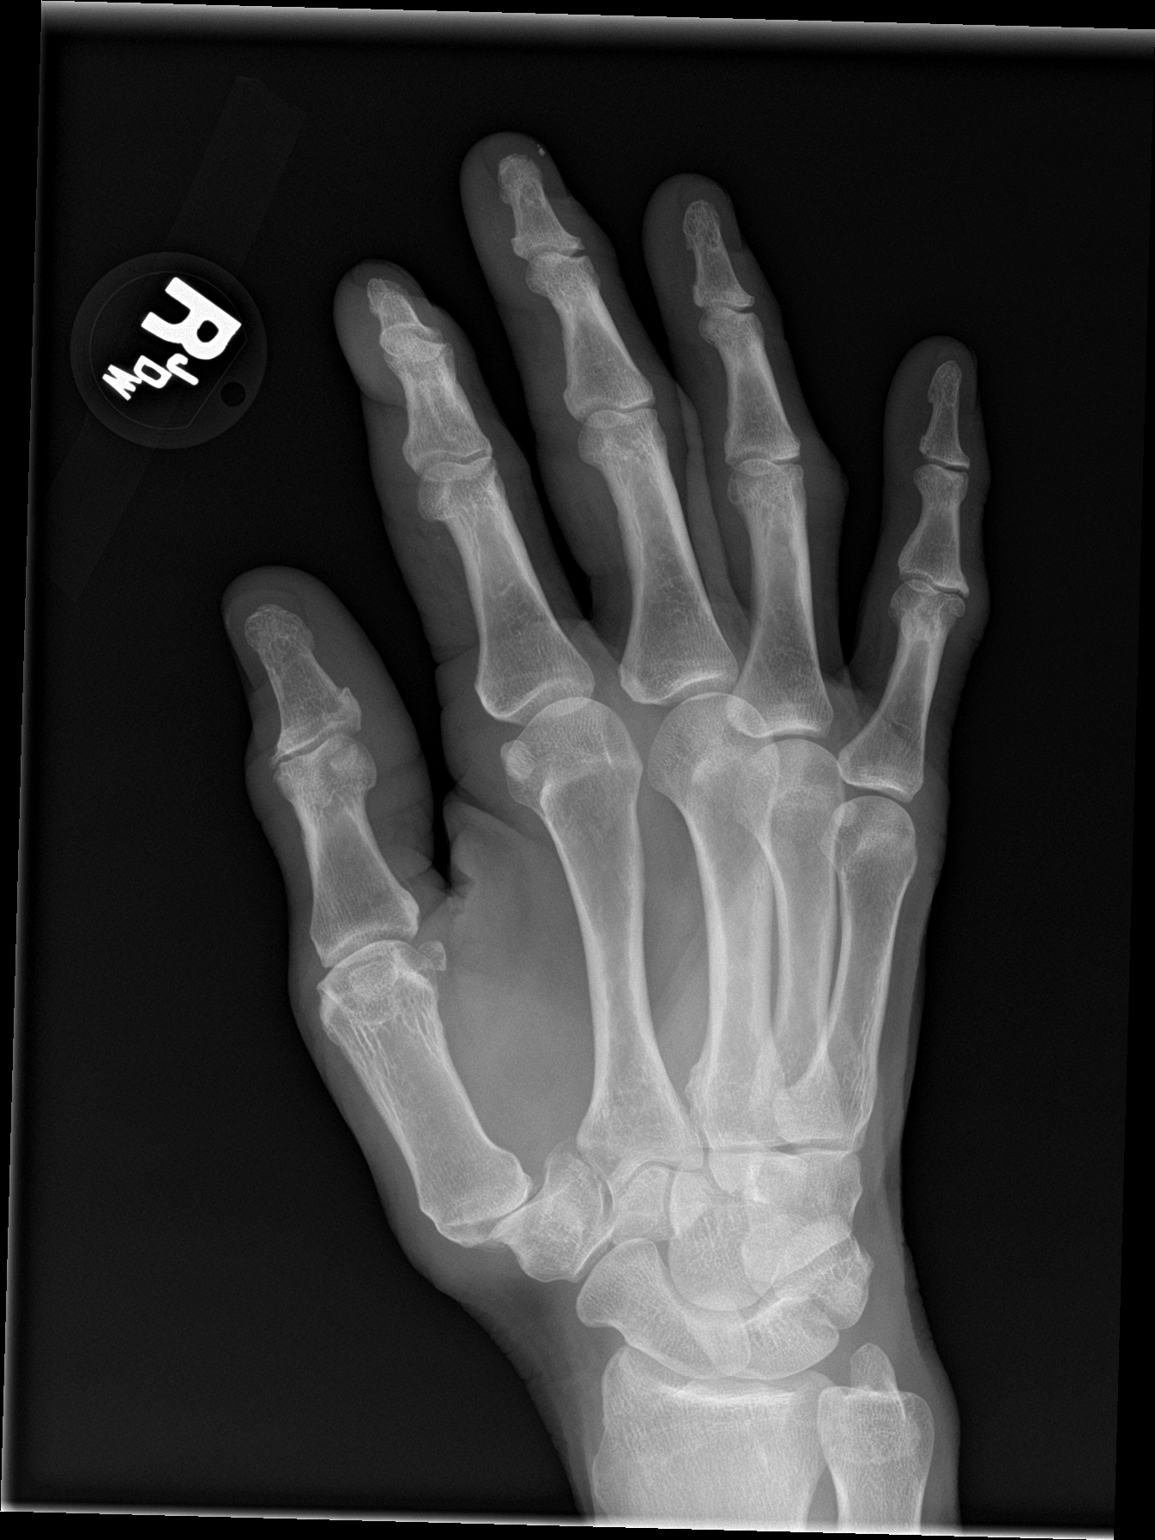

[hand lat]
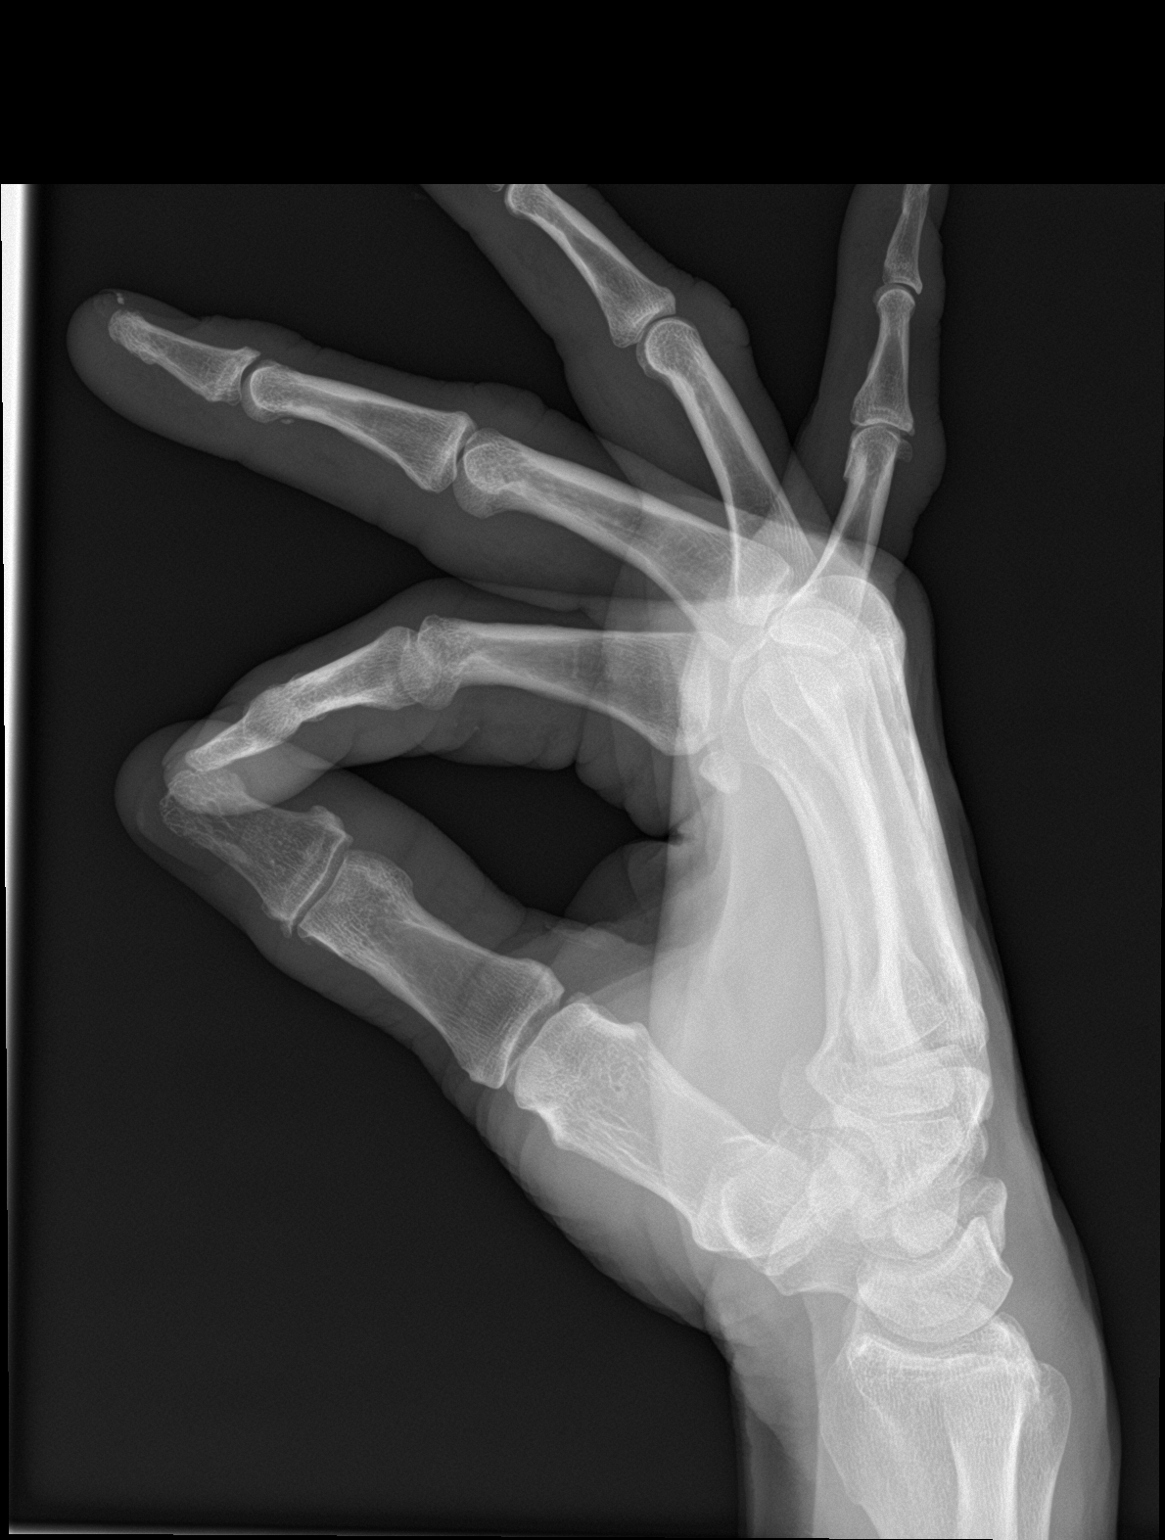

[3 of 3 positions shown; findings below may reference images not displayed]

FINDINGS: There is no evidence of fracture or dislocation. The joint spaces
are preserved. The carpal rows are intact, and demonstrate normal
alignment. A tiny radiopaque foreign body is noted at the third
fingernail.
IMPRESSION: No evidence of fracture or dislocation.

## 2016-05-13 IMAGING — DX DG ANKLE COMPLETE 3+V*L*
3 series · 3 of 3 positions shown · non-contrast
Comparison: None.

CLINICAL DATA: Status post fall 15 feet off ladder, with injury to
the left ankle. Diffuse left ankle swelling and pain. Initial
encounter.

EXAM:
LEFT ANKLE COMPLETE - 3+ VIEW

[ankle ap]
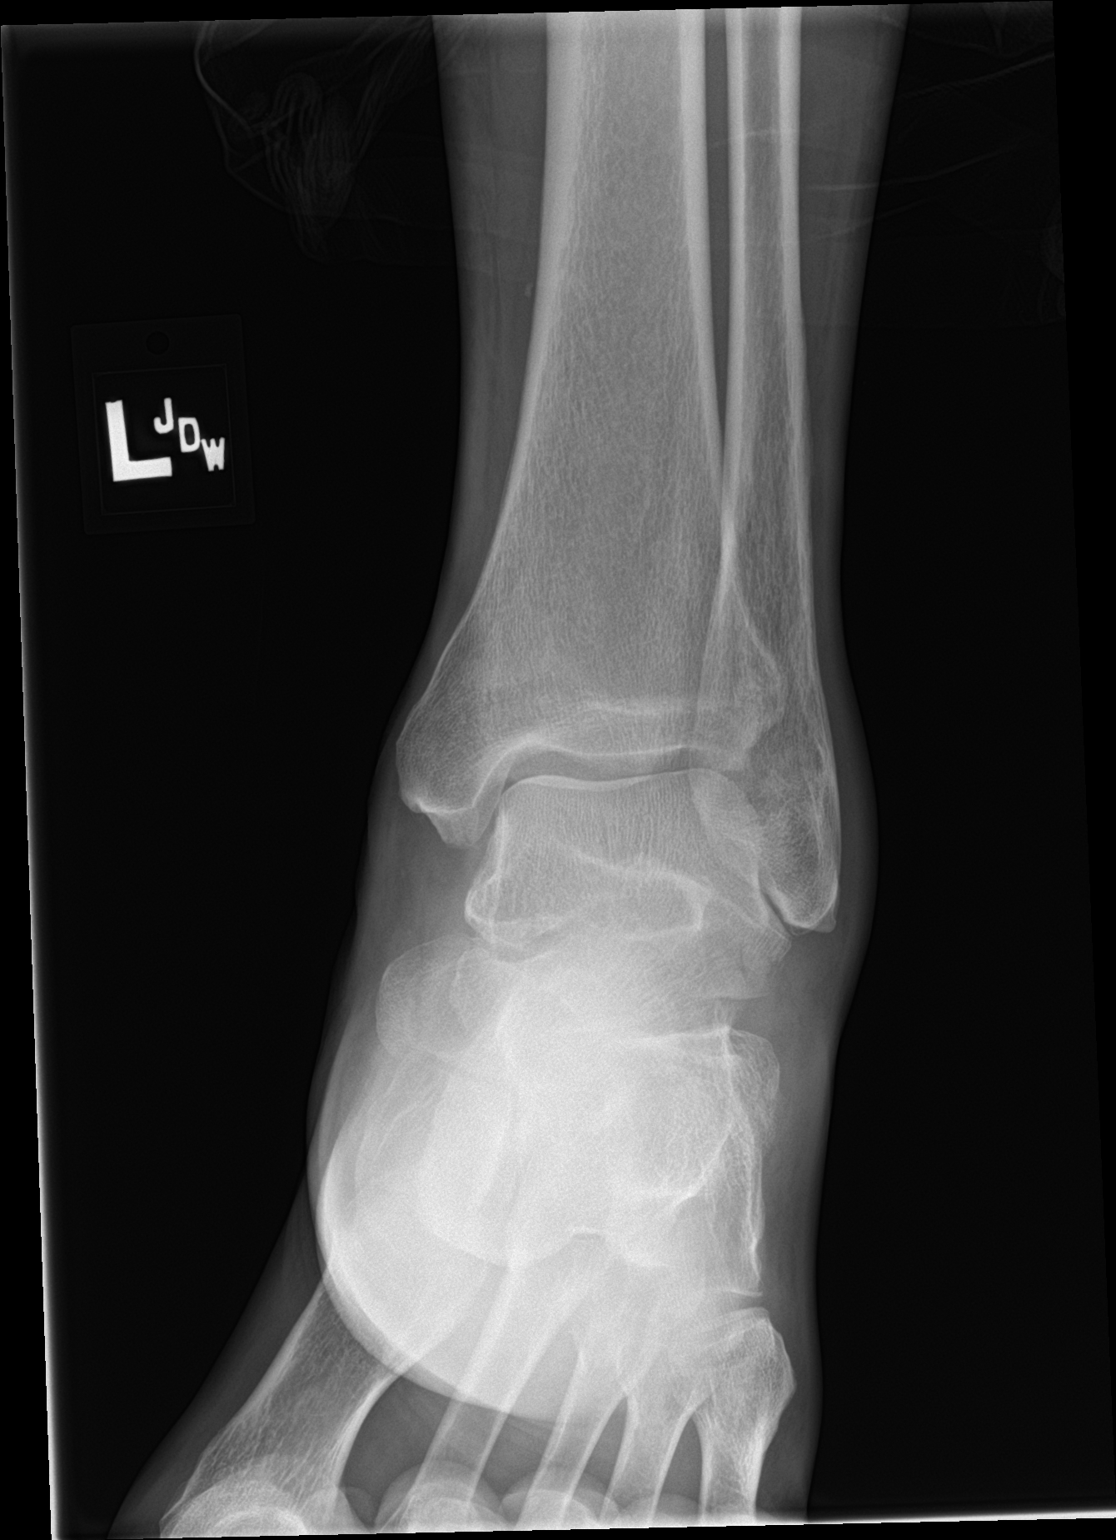

[ankle obl]
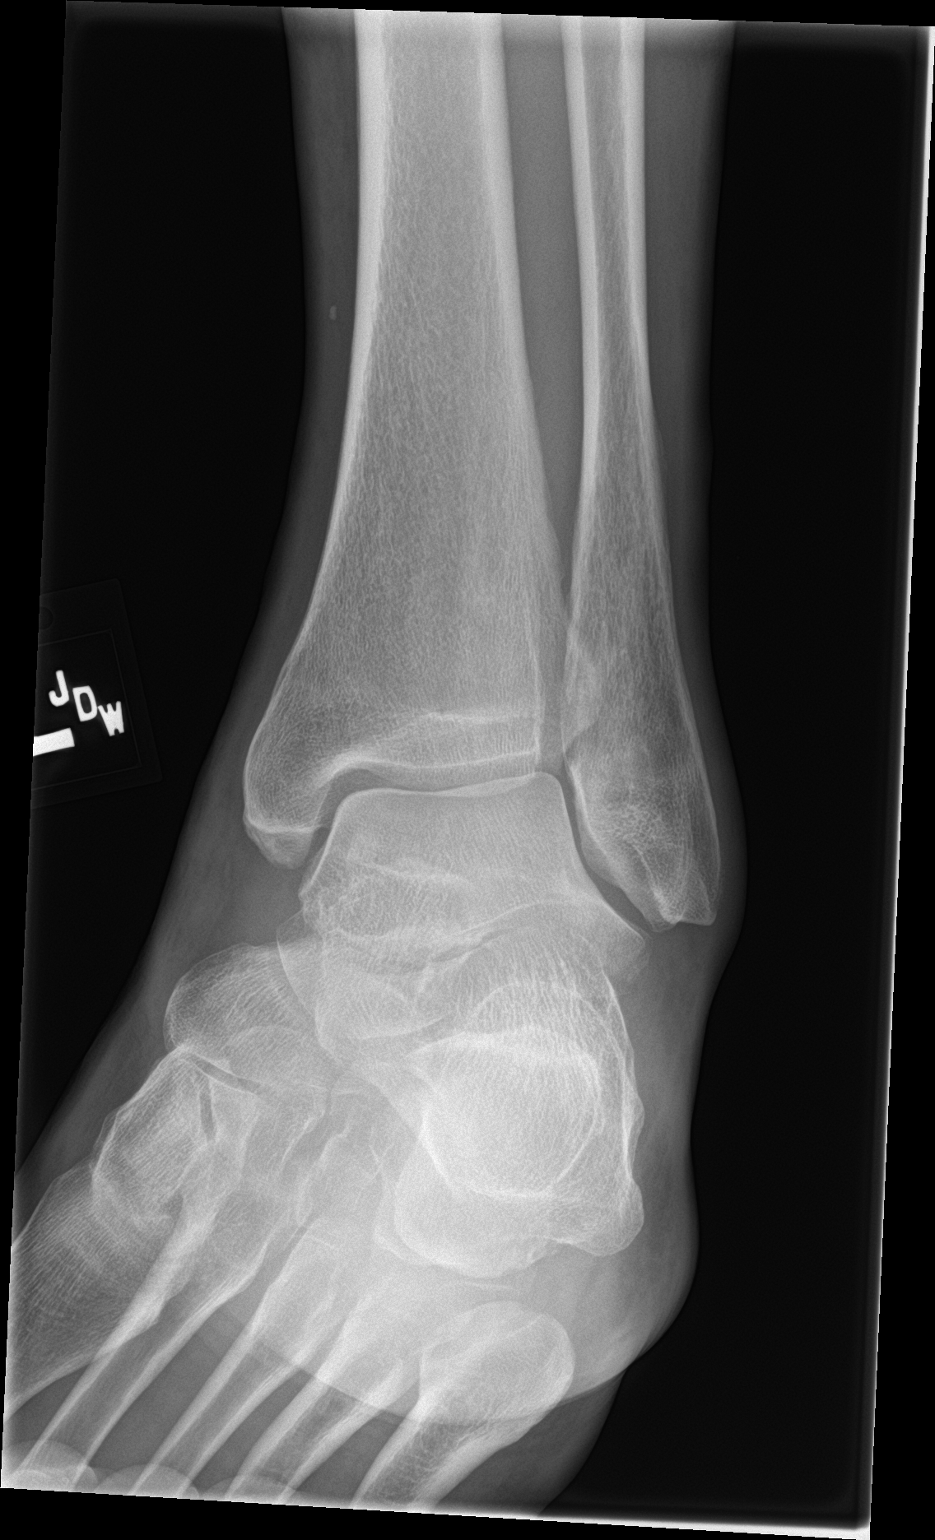

[ankle lat]
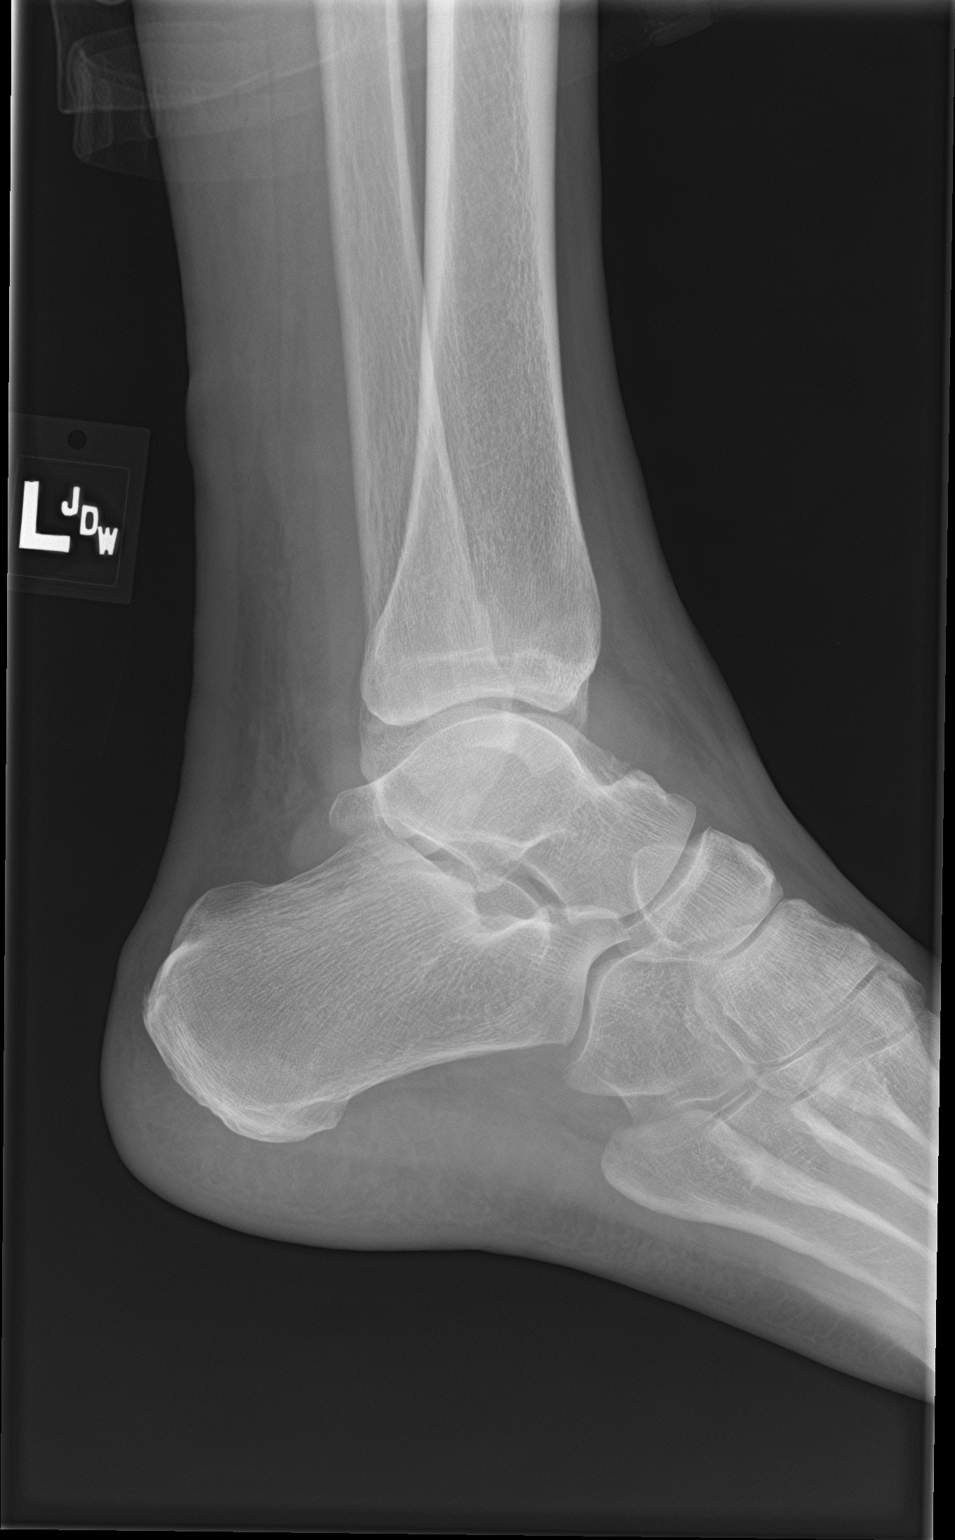

[3 of 3 positions shown; findings below may reference images not displayed]

FINDINGS: There is no evidence of fracture or dislocation. The ankle mortise
is intact; the interosseous space is within normal limits. No talar
tilt or subluxation is seen.

A large ankle joint effusion is noted.
IMPRESSION: 1. No evidence of fracture or dislocation.
2. Large ankle joint effusion noted.

## 2016-05-13 NOTE — ED Triage Notes (Signed)
Pt states that he was working on a roof and he was on a ladder and fell appx 15 feet with the ladder onto a brink patio. Pt states he did not hit head, no LOC. Pt states that ladder was wrapped around his L ankle, some swelling, swelling to fingers of R hand, pt is on blood thinners. Pt reports that he worked for three more hours after falling.

## 2016-05-13 NOTE — ED Notes (Signed)
Pt daughter asking for pulse check in foot, foot is warm and dry, pulse is 2+

## 2016-05-14 ENCOUNTER — Emergency Department (HOSPITAL_COMMUNITY)
Admission: EM | Admit: 2016-05-14 | Discharge: 2016-05-14 | Disposition: A | Discharge status: Home / Self Care | Payer: Self-pay | Attending: Emergency Medicine | Admitting: Emergency Medicine

## 2016-05-14 DIAGNOSIS — S93402A Sprain of unspecified ligament of left ankle, initial encounter: Secondary | ICD-10-CM

## 2016-05-14 MED ORDER — OXYCODONE-ACETAMINOPHEN 5-325 MG PO TABS: 1.0000 | ORAL_TABLET | Freq: Four times a day (QID) | ORAL | 0 refills | Status: DC | PRN

## 2016-05-14 NOTE — Progress Notes (Signed)
Orthopedic Tech Progress Note Patient Details:  Craig Hahn 1960-12-15 [id number]  Ortho Devices Type of Ortho Device: Crutches, CAM walker Ortho Device/Splint Location: lle Ortho Device/Splint Interventions: Ordered, Application   Trinna Post 05/14/2016, 3:57 AM

## 2016-05-14 NOTE — ED Provider Notes (Signed)
[facility]-EMER[facility]Y DEPT Provider Note   CSN: [id number] Arrival date & time: 05/13/16  2102    History   Chief Complaint Chief Complaint  Patient presents with  . Fall    HPI Craig Hahn is a 56 y.o. male.  Patient is a 56 year old male who presents to the emergency department for evaluation of injuries following a fall. Patient states that he was working on a roof when his ladder fell onto a brick patio. As the latter was falling, it reportedly wrapped around his left ankle causing onset of the patient's pain. Pain is worse with ambulation and palpation to the ankle. He also sustained an injury to his right hand and has noted swelling to his fingers. Patient was able to subsequently work for 3 hours after the incident. He denies hitting his head or losing consciousness. No medications taken prior to arrival. Patient does state that he is on blood thinners.     Past Medical History:  Diagnosis Date  . Allergy   . Anxiety   . Clotting disorder (HCC)    Unsure if has ever been worked up, per pet has had 9 clots in the past, last was in 2006, both brothers also have clotting issues  . Depression   . DVT (deep venous thrombosis) [facility])     Patient Active Problem List   Diagnosis Date Noted  . Encounter for therapeutic drug monitoring 04/07/2016  . Deep vein thrombosis (DVT) of proximal lower extremity, unspecified chronicity, unspecified laterality (HCC) 03/17/2016  . Alcohol abuse, daily use 12/20/2015  . LPRD (laryngopharyngeal reflux disease) 12/20/2015  . DVT (deep venous thrombosis) (HCC) 11/23/2013    Past Surgical History:  Procedure Laterality Date  . dental implants     x8       Home Medications    Prior to Admission medications   Medication Sig Start Date End Date Taking? Authorizing Provider  Ascorbic Acid (VITAMIN C PO) Take 1 tablet by mouth 3 (three) times daily.    Historical Provider, MD  Calcium-Magnesium-Vitamin D (CALCIUM MAGNESIUM PO) Take 2 tablets  by mouth daily.    Historical Provider, MD  omega-3 acid ethyl esters (LOVAZA) 1 g capsule Take 2 g by mouth daily.    Historical Provider, MD  OVER THE COUNTER MEDICATION Take 1 capsule by mouth daily. "Calmzyme."    Historical Provider, MD  oxyCODONE-acetaminophen (PERCOCET/ROXICET) 5-325 MG tablet Take 1-2 tablets by mouth every 6 (six) hours as needed for severe pain. 05/14/16   Antony Madura, PA-C  warfarin (COUMADIN) 2 MG tablet Take as directed by anticoagulation clinic. 04/08/16   Waldon Merl, PA-C  warfarin (COUMADIN) 5 MG tablet Take as directed by anticoagulation clinic. 04/08/16   Waldon Merl, PA-C    Family History Family History  Problem Relation Age of Onset  . Heart attack Father   . Clotting disorder Brother   . Clotting disorder Brother     Social History Social History  Substance Use Topics  . Smoking status: Current Some Day Smoker    Packs/day: 1.00    Types: E-cigarettes, Cigarettes  . Smokeless tobacco: Never Used  . Alcohol use Yes     Comment: daily 4 beers      Allergies   Codeine; Penicillins; and Sulfa antibiotics   Review of Systems Review of Systems Ten systems reviewed and are negative for acute change, except as noted in the HPI.    Physical Exam Updated Vital Signs BP 117/80   Pulse (!) 55  Temp 98.1 F (36.7 C) (Oral)   Resp 18   Ht  (1.88 m)   Wt 63.5 kg   SpO2 96%   BMI 17.97 kg/m   Physical Exam  Constitutional: He is oriented to person, place, and time. He appears well-developed and well-nourished. No distress.  Nontoxic appearing. Witty humor, in fairly good spirits.  HENT:  Head: Normocephalic and atraumatic.  Eyes: Conjunctivae and EOM are normal. No scleral icterus.  Neck: Normal range of motion.  Cardiovascular: Normal rate, regular rhythm and intact distal pulses.   DP and PT pulse 2+ in the LLE. Capillary refill brisk in all digits of L foot.  Pulmonary/Chest: Effort normal. No respiratory distress.    Respirations even and unlabored  Musculoskeletal:       Left ankle: He exhibits decreased range of motion and swelling. He exhibits no deformity, no laceration and normal pulse. Tenderness. Lateral malleolus and medial malleolus tenderness found. Achilles tendon normal.  Compartments of the foot and LLE are soft. Swelling and tenderness noted to the left ankle. Mild ecchymosis.   Neurological: He is alert and oriented to person, place, and time. He exhibits normal muscle tone. Coordination normal.  Sensation to light touch intact in the LLE. Patient able to wiggle all toes. Achilles tendon intact.  Skin: Skin is warm and dry. No rash noted. He is not diaphoretic. No erythema. No pallor.  Psychiatric: He has a normal mood and affect. His behavior is normal.  Nursing note and vitals reviewed.    ED Treatments / Results  Labs (all labs ordered are listed, but only abnormal results are displayed) Labs Reviewed - No data to display  EKG  EKG Interpretation None       Radiology Dg Ankle Complete Left  Result Date: 05/13/2016 CLINICAL DATA:  Status post fall 15 feet off ladder, with injury to the left ankle. Diffuse left ankle swelling and pain. Initial encounter. EXAM: LEFT ANKLE COMPLETE - 3+ VIEW COMPARISON:  None. FINDINGS: There is no evidence of fracture or dislocation. The ankle mortise is intact; the interosseous space is within normal limits. No talar tilt or subluxation is seen. A large ankle joint effusion is noted. IMPRESSION: 1. No evidence of fracture or dislocation. 2. Large ankle joint effusion noted. Electronically Signed   By: Roanna Raider M.D.   On: 05/13/2016 22:05   Dg Hand Complete Right  Result Date: 05/13/2016 CLINICAL DATA:  Status post fall 15 feet off ladder, with right hand pain and swelling. Initial encounter. EXAM: RIGHT HAND - COMPLETE 3+ VIEW COMPARISON:  None. FINDINGS: There is no evidence of fracture or dislocation. The joint spaces are preserved. The  carpal rows are intact, and demonstrate normal alignment. A tiny radiopaque foreign body is noted at the third fingernail. IMPRESSION: No evidence of fracture or dislocation. Electronically Signed   By: Roanna Raider M.D.   On: 05/13/2016 22:07    Procedures Procedures (including critical care time)  Medications Ordered in ED Medications - No data to display   Initial Impression / Assessment and Plan / ED Course  I have reviewed the triage vital signs and the nursing notes.  Pertinent labs & imaging results that were available during my care of the patient were reviewed by me and considered in my medical decision making (see chart for details).     56 year old male presents to the emergency department for evaluation of pain to his right hand and left ankle after a fall. Patient is neurovascularly intact.  There is diffuse swelling to the ankle without crepitus or deformity. Given degree of swelling, suspect high-grade ankle sprain. Ankle stabilized with Cam Walker. Patient has been told to remain nonweightbearing until he is able to follow-up with an orthopedist. Referral provided as well as prescription for Percocet for pain control. The patient is unable to take NSAIDs secondary to use of warfarin. RICE advised. Return precautions discussed and provided. Patient discharged in stable condition with no unaddressed concerns.   Final Clinical Impressions(s) / ED Diagnoses   Final diagnoses:  Sprain of left ankle, unspecified ligament, initial encounter    New Prescriptions New Prescriptions   OXYCODONE-ACETAMINOPHEN (PERCOCET/ROXICET) 5-325 MG TABLET    Take 1-2 tablets by mouth every 6 (six) hours as needed for severe pain.     Antony Madura, PA-C 05/14/16 1610    Layla Maw Ward, DO 05/14/16 [phone]

## 2016-05-14 NOTE — Discharge Instructions (Signed)
Keep your left leg/ankle elevated as much as possible. We recommend that you ice your ankle 3-4 times per day to limit swelling. You may take Percocet as needed for pain control. Do not drive or drink alcohol after taking this medication as it may make you drowsy and impair your judgment. Wear the walking boot that you were given in the emergency department at all times. We recommend that you use crutches to prevent from putting weight on your left foot until advised to do so by an orthopedic specialist. Call in the morning to schedule an appointment with an orthopedic doctor. You may follow-up with your primary doctor, as desired. Return to the emergency department for new or concerning symptoms.

## 2016-05-14 NOTE — ED Notes (Signed)
Patient left at this time with all belongings. 

## 2016-06-22 ENCOUNTER — Ambulatory Visit: Payer: Self-pay

## 2016-06-24 ENCOUNTER — Other Ambulatory Visit: Payer: Self-pay | Admitting: General Practice

## 2016-06-24 ENCOUNTER — Ambulatory Visit (INDEPENDENT_AMBULATORY_CARE_PROVIDER_SITE_OTHER): Payer: Self-pay | Admitting: General Practice

## 2016-06-24 DIAGNOSIS — Z5181 Encounter for therapeutic drug level monitoring: Secondary | ICD-10-CM

## 2016-06-24 DIAGNOSIS — I824Y9 Acute embolism and thrombosis of unspecified deep veins of unspecified proximal lower extremity: Secondary | ICD-10-CM

## 2016-06-24 LAB — POCT INR: INR: 2.2

## 2016-06-24 MED ORDER — WARFARIN SODIUM 2 MG PO TABS: ORAL_TABLET | ORAL | 1 refills | Status: DC

## 2016-06-24 MED ORDER — WARFARIN SODIUM 5 MG PO TABS: ORAL_TABLET | ORAL | 1 refills | Status: DC

## 2016-06-24 NOTE — Patient Instructions (Signed)
Pre visit review using our clinic review tool, if applicable. No additional management support is needed unless otherwise documented below in the visit note. 

## 2016-06-25 NOTE — Progress Notes (Signed)
I agree with this plan.

## 2016-08-17 ENCOUNTER — Other Ambulatory Visit: Payer: Self-pay | Admitting: General Practice

## 2016-08-17 ENCOUNTER — Ambulatory Visit (INDEPENDENT_AMBULATORY_CARE_PROVIDER_SITE_OTHER): Payer: Self-pay | Admitting: General Practice

## 2016-08-17 DIAGNOSIS — I824Y9 Acute embolism and thrombosis of unspecified deep veins of unspecified proximal lower extremity: Secondary | ICD-10-CM

## 2016-08-17 DIAGNOSIS — Z5181 Encounter for therapeutic drug level monitoring: Secondary | ICD-10-CM

## 2016-08-17 LAB — POCT INR: INR: 1.8

## 2016-08-17 MED ORDER — WARFARIN SODIUM 2 MG PO TABS: ORAL_TABLET | ORAL | 1 refills | Status: DC

## 2016-08-17 MED ORDER — WARFARIN SODIUM 5 MG PO TABS: ORAL_TABLET | ORAL | 1 refills | Status: DC

## 2016-08-17 NOTE — Patient Instructions (Signed)
Pre visit review using our clinic review tool, if applicable. No additional management support is needed unless otherwise documented below in the visit note. 

## 2016-08-17 NOTE — Progress Notes (Signed)
I agree with this plan.

## 2016-10-12 ENCOUNTER — Ambulatory Visit (INDEPENDENT_AMBULATORY_CARE_PROVIDER_SITE_OTHER): Payer: Self-pay | Admitting: General Practice

## 2016-10-12 DIAGNOSIS — I824Y9 Acute embolism and thrombosis of unspecified deep veins of unspecified proximal lower extremity: Secondary | ICD-10-CM

## 2016-10-12 DIAGNOSIS — Z5181 Encounter for therapeutic drug level monitoring: Secondary | ICD-10-CM

## 2016-10-12 NOTE — Progress Notes (Signed)
I agree with this plan.

## 2016-10-12 NOTE — Patient Instructions (Signed)
Pre visit review using our clinic review tool, if applicable. No additional management support is needed unless otherwise documented below in the visit note. 

## 2016-10-30 ENCOUNTER — Emergency Department (HOSPITAL_BASED_OUTPATIENT_CLINIC_OR_DEPARTMENT_OTHER): Payer: Self-pay

## 2016-10-30 ENCOUNTER — Emergency Department (HOSPITAL_COMMUNITY)
Admission: EM | Admit: 2016-10-30 | Discharge: 2016-10-30 | Disposition: A | Discharge status: Home / Self Care | Payer: Self-pay

## 2016-10-30 ENCOUNTER — Encounter (HOSPITAL_BASED_OUTPATIENT_CLINIC_OR_DEPARTMENT_OTHER): Payer: Self-pay | Admitting: Emergency Medicine

## 2016-10-30 ENCOUNTER — Emergency Department (HOSPITAL_BASED_OUTPATIENT_CLINIC_OR_DEPARTMENT_OTHER)
Admission: EM | Admit: 2016-10-30 | Discharge: 2016-10-31 | Disposition: A | Discharge status: Home / Self Care | Payer: Self-pay | Attending: Emergency Medicine | Admitting: Emergency Medicine

## 2016-10-30 DIAGNOSIS — M542 Cervicalgia: Secondary | ICD-10-CM | POA: Insufficient documentation

## 2016-10-30 DIAGNOSIS — F17210 Nicotine dependence, cigarettes, uncomplicated: Secondary | ICD-10-CM | POA: Insufficient documentation

## 2016-10-30 DIAGNOSIS — R531 Weakness: Secondary | ICD-10-CM | POA: Insufficient documentation

## 2016-10-30 DIAGNOSIS — Z7901 Long term (current) use of anticoagulants: Secondary | ICD-10-CM | POA: Insufficient documentation

## 2016-10-30 DIAGNOSIS — R202 Paresthesia of skin: Secondary | ICD-10-CM | POA: Insufficient documentation

## 2016-10-30 DIAGNOSIS — F17200 Nicotine dependence, unspecified, uncomplicated: Secondary | ICD-10-CM | POA: Insufficient documentation

## 2016-10-30 DIAGNOSIS — R200 Anesthesia of skin: Secondary | ICD-10-CM

## 2016-10-30 DIAGNOSIS — Z79899 Other long term (current) drug therapy: Secondary | ICD-10-CM | POA: Insufficient documentation

## 2016-10-30 LAB — BASIC METABOLIC PANEL
ANION GAP: 9 (ref 5–15)
BUN: 13 mg/dL (ref 6–20)
CALCIUM: 9.1 mg/dL (ref 8.9–10.3)
CHLORIDE: 107 mmol/L (ref 101–111)
CO2: 22 mmol/L (ref 22–32)
Creatinine, Ser: 0.99 mg/dL (ref 0.61–1.24)
GFR calc non Af Amer: >60 mL/min (ref >60)
GLUCOSE: 95 mg/dL (ref 65–99)
Potassium: 4.1 mmol/L (ref 3.5–5.1)
Sodium: 138 mmol/L (ref 135–145)

## 2016-10-30 LAB — CBC
HCT: 47.4 % (ref 39.0–52.0)
HEMOGLOBIN: 16.5 g/dL (ref 13.0–17.0)
MCH: 32.9 pg (ref 26.0–34.0)
MCHC: 34.8 g/dL (ref 30.0–36.0)
MCV: 94.4 fL (ref 78.0–100.0)
Platelets: 194 10*3/uL (ref 150–400)
RBC: 5.02 MIL/uL (ref 4.22–5.81)
RDW: 13.7 % (ref 11.5–15.5)
WBC: 8.5 10*3/uL (ref 4.0–10.5)

## 2016-10-30 LAB — TROPONIN I: Troponin I: <0.03 ng/mL (ref <0.03)

## 2016-10-30 IMAGING — CT CT CERVICAL SPINE W/O CM
3 of 4 series · 10 of 33 positions shown, 12 images · non-contrast
Comparison: None.

CLINICAL DATA: C-spine trauma, high clinical risk (NEXUS/CCR). Fall
in [place]. Decreased upper extremity strength.

EXAM:
CT CERVICAL SPINE WITHOUT CONTRAST
TECHNIQUE: Multidetector CT imaging of the cervical spine was performed without
intravenous contrast. Multiplanar CT image reconstructions were also
generated.

[Series 5: sagittal bone · sagittal · 0.29mm/px · 5 of 61 slices shown, 6 images]
[im 21/61  bone]
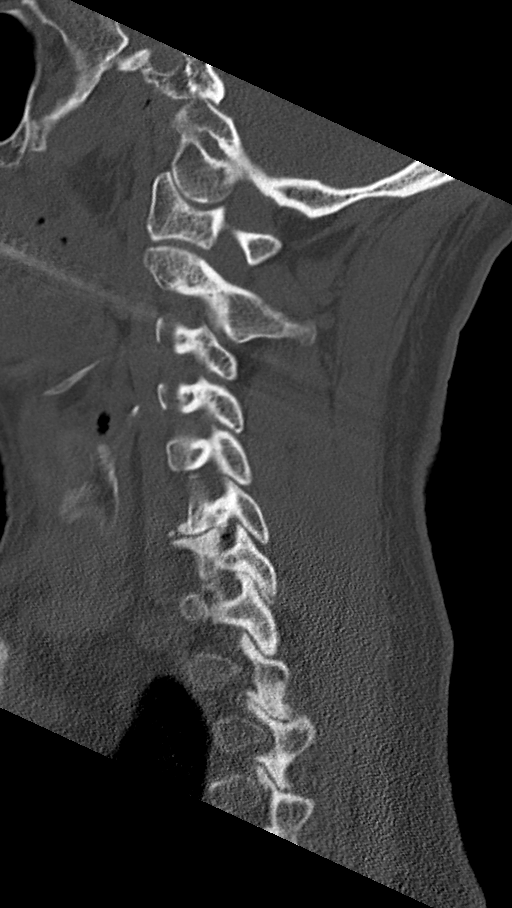
[im 26/61  bone]
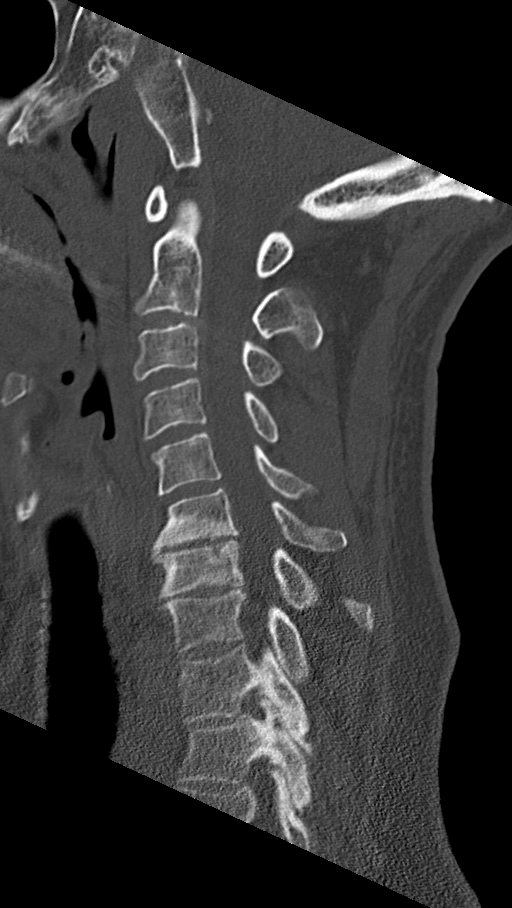
[im 31/61  soft-tissue]
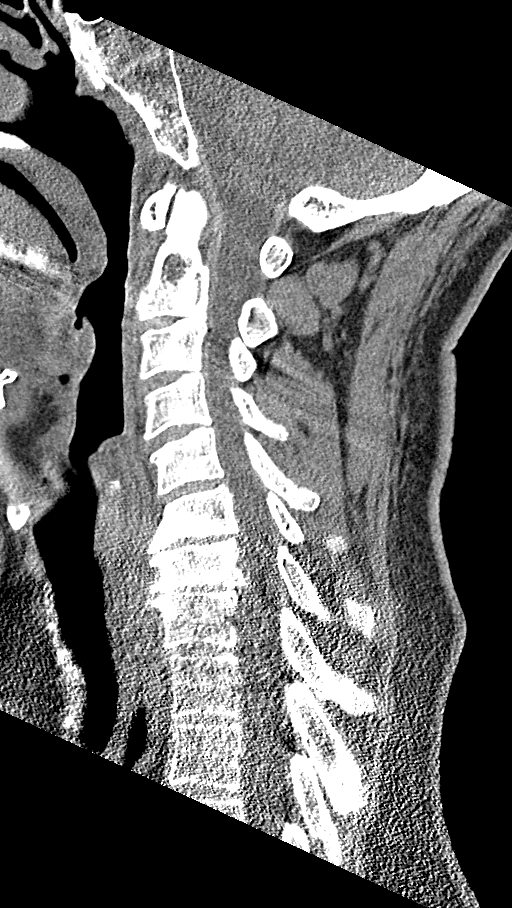
[im 31/61  bone]
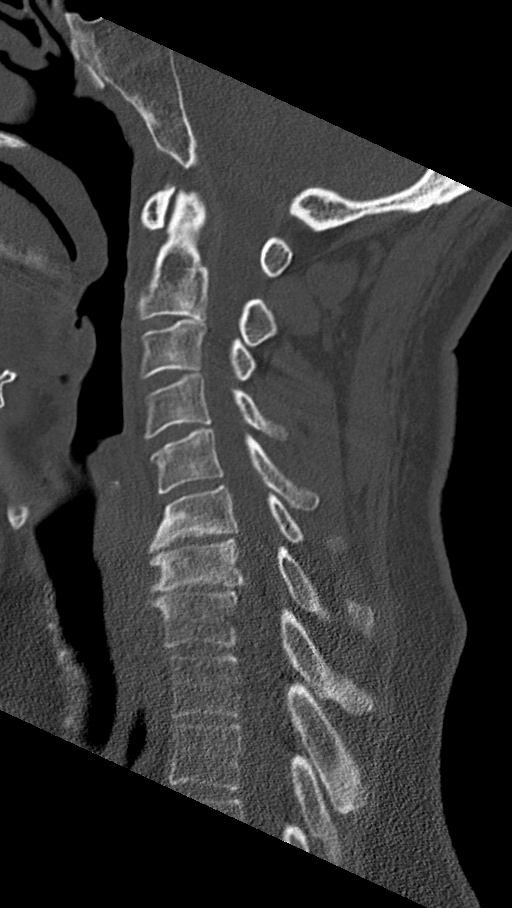
[im 36/61  bone]
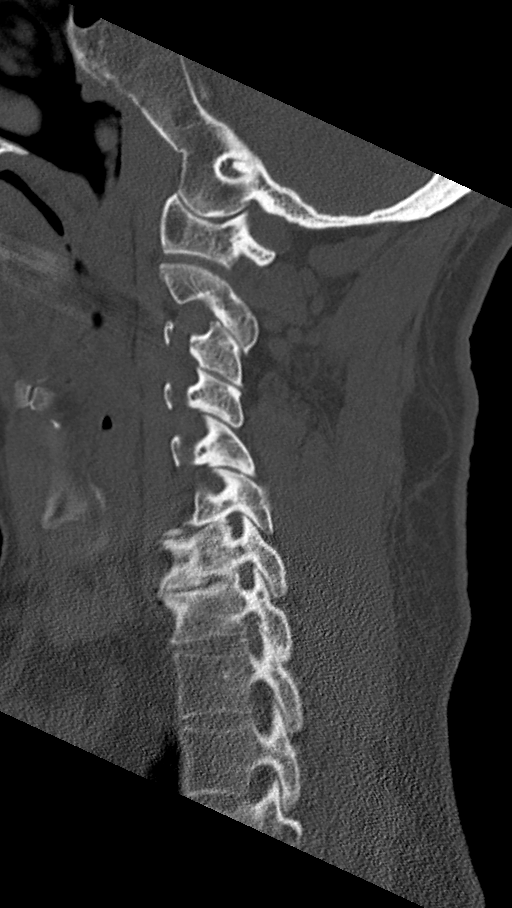
[im 41/61  bone]
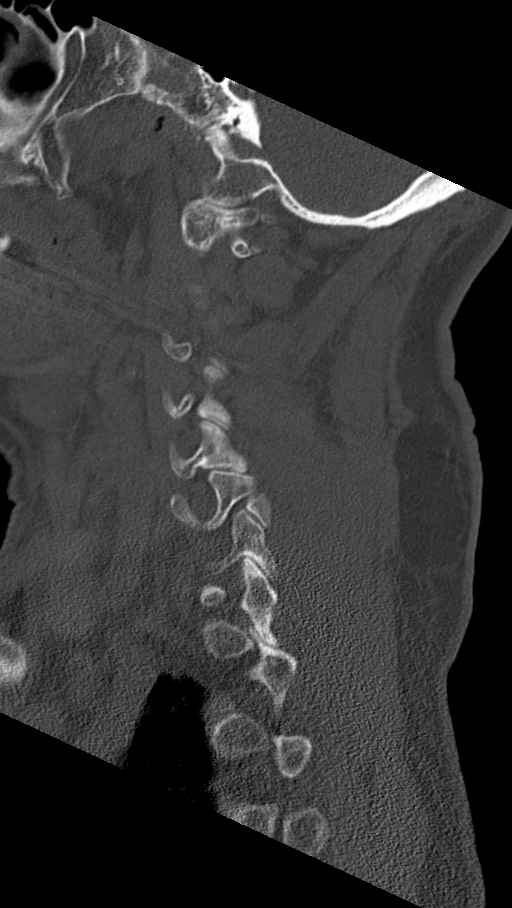

[Series 6: coronal bone · coronal · 0.25mm/px · 3 of 64 slices shown]
[im 16/64  bone]
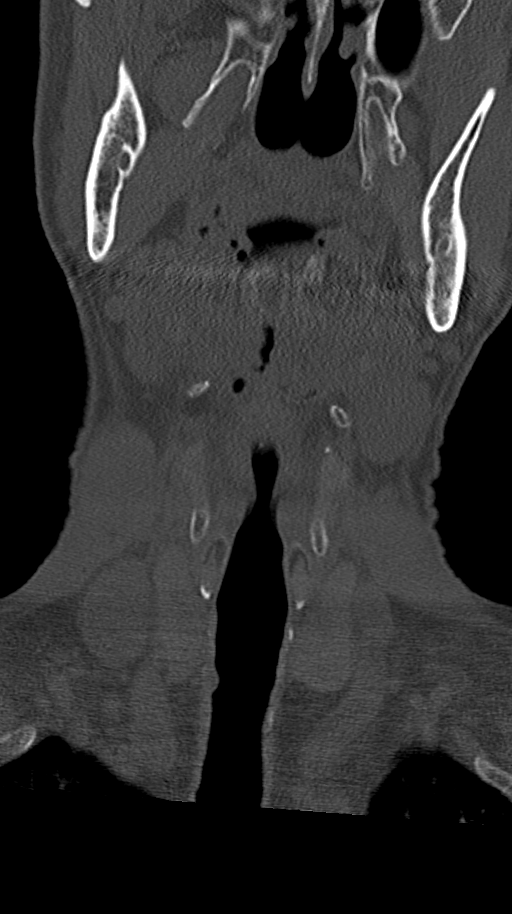
[im 27/64  bone]
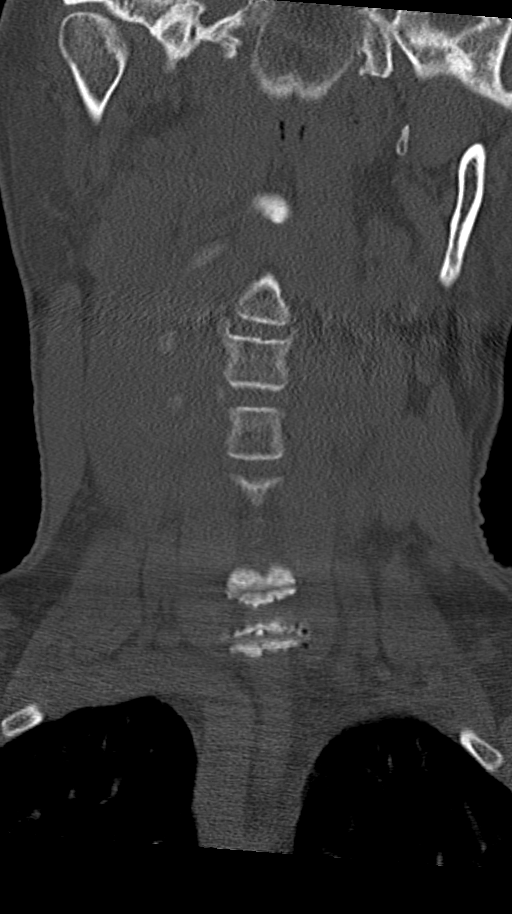
[im 37/64  bone]
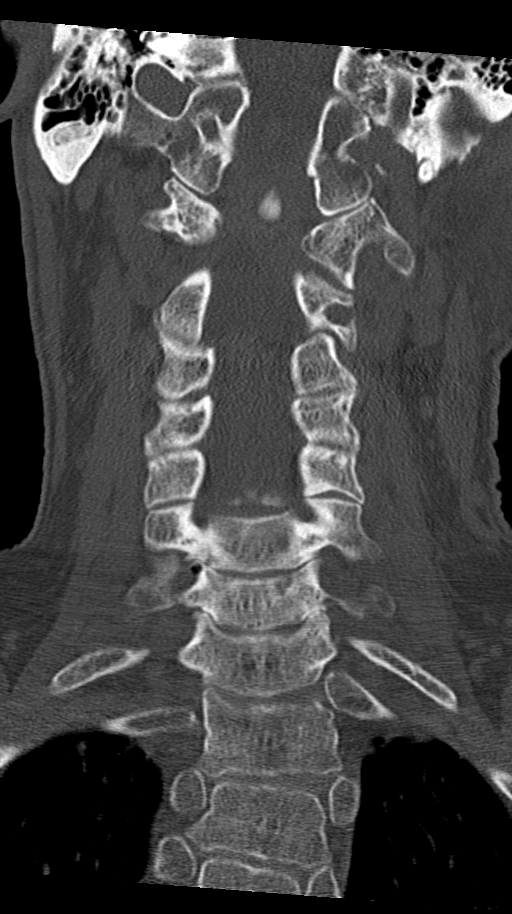

[Series 7: orthogonal bone · axial · 0.23mm/px · z∈[-231,-141]mm · 2 of 116 slices shown, 3 images]
[im 33/116  soft-tissue]
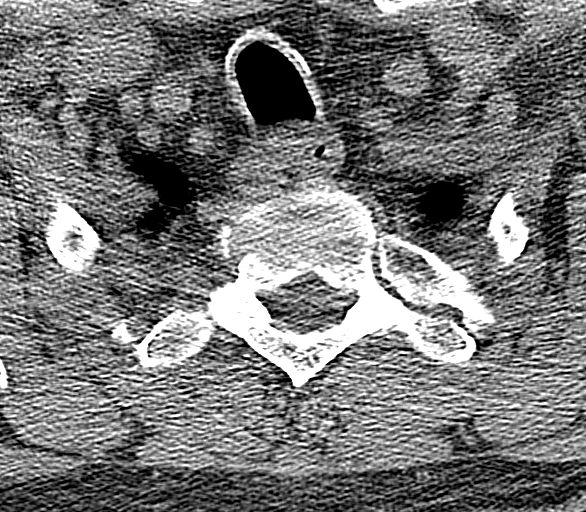
[im 33/116  bone]
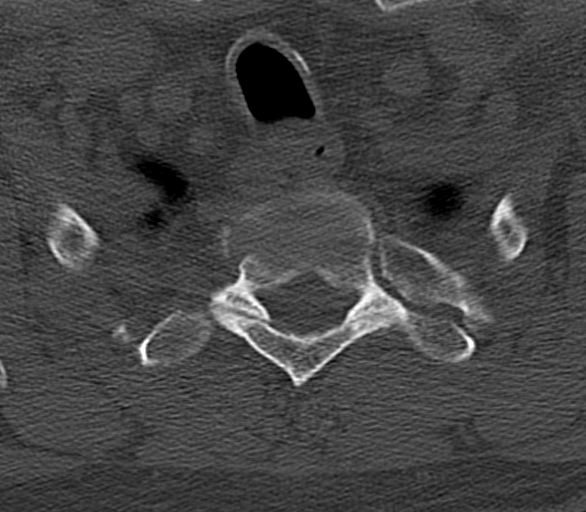
[im 83/116  bone]
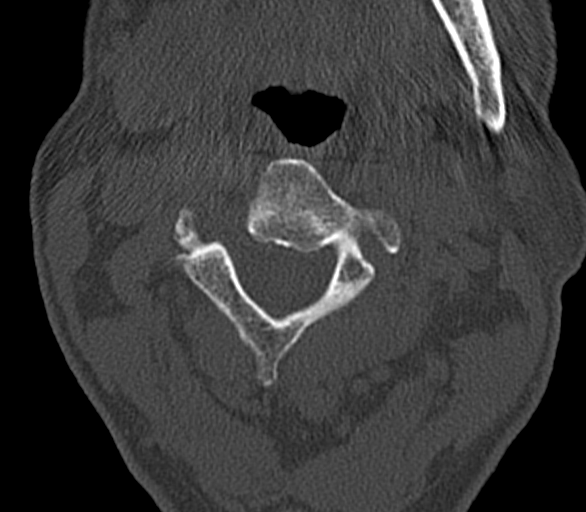

[10 of 33 positions shown; findings below may reference images not displayed]

FINDINGS: Alignment: Straightening if usual lordosis. No traumatic
subluxation.

Skull base and vertebrae: No acute fracture. Vertebral body heights
are maintained. The dens and skull base are intact.

Soft tissues and spinal canal: No prevertebral fluid or swelling. No
visible canal hematoma.

Disc levels: Disc space narrowing and endplate spurring prominent at
C6-C7 and C7-T1. Mild scattered facet arthropathy.

Upper chest: No acute finding. Aberrant right subclavian artery is
partially included.

Other: None.
IMPRESSION: 1. Degenerative disc disease at C6-C7 and C7-T1.
2. No acute fracture or subluxation of the cervical spine.
3. Consider MRI for further evaluation.

## 2016-10-30 NOTE — ED Triage Notes (Signed)
Pt reports he had a bad fall back in April. Pt reports he has slowly been losing grip strength bilaterally in his hands, has numbness from the elbows down, and has tremors in his hands, worsening in the last 2 month. His chiropractor told him he should be evaluated. Also reports intermittent dizziness and chest discomfort.

## 2016-10-30 NOTE — ED Provider Notes (Signed)
[facility]   CSN: [id number] Arrival date & time: 10/30/16  1801     History   Chief Complaint Chief Complaint  Patient presents with  . Tremors  . Weakness    HPI HYKEEM OJEDA is a 56 y.o. male.  56yo M w/ PMH including DVT, anxiety who p/w arm weakness and numbness. In April of this year, he fell off a 10-15' ladder and landed on his stomach. No LOC. Not long after the initial injury, he began having hand tremors. He reports he has had worsening weakness of arms including losing grip strength b/l and numbness from elbows down. No fevers or cough/cold symptoms. He reports chronic neck pain.   Yesterday he went to chiropractor who recommended evaluation. He has been going to chiropractor for 3-4 weeks but notes his symptoms began before then. Of note, he checked into other ER and then left prior to being seen to come here.   The history is provided by the patient.    Past Medical History:  Diagnosis Date  . Allergy   . Anxiety   . Clotting disorder (HCC)    Unsure if has ever been worked up, per pet has had 9 clots in the past, last was in 2006, both brothers also have clotting issues  . Depression   . DVT (deep venous thrombosis) [facility])     Patient Active Problem List   Diagnosis Date Noted  . Encounter for therapeutic drug monitoring 04/07/2016  . Deep vein thrombosis (DVT) of proximal lower extremity, unspecified chronicity, unspecified laterality (HCC) 03/17/2016  . Alcohol abuse, daily use 12/20/2015  . LPRD (laryngopharyngeal reflux disease) 12/20/2015  . DVT (deep venous thrombosis) (HCC) 11/23/2013    Past Surgical History:  Procedure Laterality Date  . dental implants     x8       Home Medications    Prior to Admission medications   Medication Sig Start Date End Date Taking? Authorizing Provider  warfarin (COUMADIN) 2 MG tablet Take as directed by anticoagulation clinic. 08/17/16  Yes Waldon Merl, PA-C  Ascorbic Acid  (VITAMIN C PO) Take 1 tablet by mouth 3 (three) times daily.    [provider]  Calcium-Magnesium-Vitamin D (CALCIUM MAGNESIUM PO) Take 2 tablets by mouth daily.    [provider]  omega-3 acid ethyl esters (LOVAZA) 1 g capsule Take 2 g by mouth daily.    [provider]  OVER THE COUNTER MEDICATION Take 1 capsule by mouth daily. "Calmzyme."    [provider]  oxyCODONE-acetaminophen (PERCOCET/ROXICET) 5-325 MG tablet Take 1-2 tablets by mouth every 6 (six) hours as needed for severe pain. 05/14/16   Antony Madura, PA-C  predniSONE (DELTASONE) 20 MG tablet Take 2 tablets (40 mg total) by mouth daily. 10/31/16   Little, Ambrose Finland, MD  warfarin (COUMADIN) 5 MG tablet Take as directed by anticoagulation clinic. 08/17/16   Waldon Merl, PA-C    Family History Family History  Problem Relation Age of Onset  . Heart attack Father   . Clotting disorder Brother   . Clotting disorder Brother     Social History Social History  Substance Use Topics  . Smoking status: Current Some Day Smoker    Packs/day: 1.00    Types: E-cigarettes, Cigarettes  . Smokeless tobacco: Never Used  . Alcohol use Yes     Comment: daily 4 beers      Allergies   Codeine; Penicillins; and Sulfa antibiotics   Review  of Systems Review of Systems   Physical Exam Updated Vital Signs BP 113/86 (BP Location: Right Arm)   Pulse (!) 50   Temp 98.2 F (36.8 C) (Oral)   Resp 16   Ht  (1.88 m)   Wt 76.7 kg (169 lb)   SpO2 96%   BMI 21.70 kg/m   Physical Exam  Constitutional: He is oriented to person, place, and time. He appears well-developed and well-nourished. No distress.  HENT:  Head: Normocephalic and atraumatic.  Moist mucous membranes  Eyes: Pupils are equal, round, and reactive to light. Conjunctivae are normal.  Neck: Neck supple.  No focal cervical tenderness  Cardiovascular: Normal rate, regular rhythm and normal heart sounds.   No murmur  heard. Pulmonary/Chest: Effort normal and breath sounds normal.  Abdominal: Soft. Bowel sounds are normal. He exhibits no distension. There is no tenderness.  Musculoskeletal: He exhibits no edema.  Neurological: He is alert and oriented to person, place, and time. He displays normal reflexes. No sensory deficit. Coordination normal.  Fluent speech 5/5 grip, bicep, tricep strength b/l UE Normal finger to nose testing b/l  Skin: Skin is warm and dry.  Psychiatric:  Bizarre affect  Nursing note and vitals reviewed.    ED Treatments / Results  Labs (all labs ordered are listed, but only abnormal results are displayed) Labs Reviewed  BASIC METABOLIC PANEL  CBC  TROPONIN I    EKG  EKG Interpretation None       Radiology Ct Cervical Spine Wo Contrast  Result Date: 10/30/2016 CLINICAL DATA:  C-spine trauma, high clinical risk (NEXUS/CCR). Fall in April. Decreased upper extremity strength. EXAM: CT CERVICAL SPINE WITHOUT CONTRAST TECHNIQUE: Multidetector CT imaging of the cervical spine was performed without intravenous contrast. Multiplanar CT image reconstructions were also generated. COMPARISON:  None. FINDINGS: Alignment: Straightening if usual lordosis. No traumatic subluxation. Skull base and vertebrae: No acute fracture. Vertebral body heights are maintained. The dens and skull base are intact. Soft tissues and spinal canal: No prevertebral fluid or swelling. No visible canal hematoma. Disc levels: Disc space narrowing and endplate spurring prominent at C6-C7 and C7-T1. Mild scattered facet arthropathy. Upper chest: No acute finding. 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 Other: None. IMPRESSION: 1. 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 2. No acute fracture or subluxation of the cervical spine. 3. Consider MRI for further evaluation. Electronically Signed   By: Rubye Oaks M.D.   On: 10/30/2016 23:07    Procedures Procedures (including critical care  time)  Medications Ordered in ED Medications - No data to display   Initial Impression / Assessment and Plan / ED Course  I have reviewed the triage vital signs and the nursing notes.  Pertinent labs & imaging results that were available during my care of the patient were reviewed by me and considered in my medical decision making (see chart for details).     PT w/ several months of reported upper extremity weakness and numbness after fall off ladder several months ago, no neck imaging at time of eval. He was neurologically intact on exam with no weakness or numbness on my exam. Full strength and ROM of UE. Because of his history of trauma which seemed to precede his worsening symptoms, I did recommend a CT to evaluate for bony injury. CT was negative but did show degenerative changes of his neck. Labwork obtained from triage was unremarkable. I considered vascular pathology especially given his report of chiropractor manipulation but his symptoms  began prior to these visits. He has no weakness on today's exam therefore I do not feel he needs emergent imaging but I have recommended follow-up with spine specialist to discuss future MRI. I discussed risks and benefits of short steroid course in the event that he has some nerve impingement. Divided with prednisone and follow-up information. Reviewed return precautions and patient discharged in satisfactory condition.  Final Clinical Impressions(s) / ED Diagnoses   Final diagnoses:  Cervical spine pain  Numbness of upper extremity    New Prescriptions New Prescriptions   PREDNISONE (DELTASONE) 20 MG TABLET    Take 2 tablets (40 mg total) by mouth daily.     Little, Ambrose Finland, MD 10/31/16 [phone]

## 2016-10-30 NOTE — ED Notes (Signed)
Patient transported to CT 

## 2016-10-30 NOTE — ED Notes (Signed)
Patient was called for triage. Patient walked to the triage doors and stated, "i am going to go find me a real hospital. I know that sounds crazy." Patient turned and left

## 2016-10-31 MED ORDER — PREDNISONE 20 MG PO TABS: 40.0000 mg | ORAL_TABLET | Freq: Every day | ORAL | 0 refills | Status: DC

## 2016-11-25 ENCOUNTER — Ambulatory Visit (INDEPENDENT_AMBULATORY_CARE_PROVIDER_SITE_OTHER): Payer: Self-pay | Admitting: General Practice

## 2016-11-25 DIAGNOSIS — I824Y9 Acute embolism and thrombosis of unspecified deep veins of unspecified proximal lower extremity: Secondary | ICD-10-CM

## 2016-11-25 DIAGNOSIS — Z7901 Long term (current) use of anticoagulants: Secondary | ICD-10-CM

## 2016-11-25 LAB — POCT INR: INR: 1.8

## 2016-11-25 NOTE — Progress Notes (Signed)
I agree with this plan.

## 2016-11-25 NOTE — Patient Instructions (Signed)
Pre visit review using our clinic review tool, if applicable. No additional management support is needed unless otherwise documented below in the visit note. 

## 2016-12-18 ENCOUNTER — Other Ambulatory Visit: Payer: Self-pay | Admitting: Physician Assistant

## 2016-12-18 DIAGNOSIS — I824Y9 Acute embolism and thrombosis of unspecified deep veins of unspecified proximal lower extremity: Secondary | ICD-10-CM

## 2017-01-04 ENCOUNTER — Telehealth: Payer: Self-pay | Admitting: Physician Assistant

## 2017-01-04 NOTE — Telephone Encounter (Signed)
Pt needs refill on both warfarin for 2mg  & 5mg , [facility] on [address].

## 2017-01-07 NOTE — Telephone Encounter (Signed)
LMOVM advising patient [facility] Has not seen since Feb 2018.

## 2017-01-13 ENCOUNTER — Telehealth: Payer: Self-pay | Admitting: *Deleted

## 2017-01-13 ENCOUNTER — Other Ambulatory Visit: Payer: Self-pay | Admitting: General Practice

## 2017-01-13 DIAGNOSIS — I824Y9 Acute embolism and thrombosis of unspecified deep veins of unspecified proximal lower extremity: Secondary | ICD-10-CM

## 2017-01-13 MED ORDER — WARFARIN SODIUM 5 MG PO TABS: ORAL_TABLET | ORAL | 0 refills | Status: DC

## 2017-01-13 MED ORDER — WARFARIN SODIUM 2 MG PO TABS: ORAL_TABLET | ORAL | 0 refills | Status: DC

## 2017-01-13 NOTE — Telephone Encounter (Signed)
Good morning! [facility].  I explained to him that he has to make an appointment with you in order to have the warfarin prescribed and to keep having INR checked with [facility].  He agreed but wasn't pleased.  I am calling the office to schedule an appointment.  I will then call in enough coumadin to last until appointment time.  If you have any questions please call me, Bailey Mechindy Boyd, RN here [facility].  Thanks, Bailey Mechindy Boyd, RN

## 2017-01-13 NOTE — Telephone Encounter (Signed)
Copied from CRM [id number]. Topic: Quick Communication - See Telephone Encounter >> Jan 07, 2017  2:06 PM Craig Hahn, Craig Hahn, Craig Hahn wrote: CRM for notification. See Telephone encounter for: Patient called for refills of his Warfarin. Per PCP patient needs a follow up appointment. He has not refilled any of the Warfarin. He has been seeing the Coumadin clinic. Patient needs appointment  01/07/17. >> Jan 12, 2017  3:45 PM Craig Hahn, Craig Hahn wrote: Patient is very upset I called him to set an appointment up with him and Craig Hahn patient states that hes been out of his medicine for a month and now the Provider wants him to f/u he states that he'Hahn not going to come in since know one cared about his health and  that he was going to call the coumadin clinic

## 2017-01-13 NOTE — Telephone Encounter (Signed)
Noted. Thank you Cynthia.

## 2017-01-13 NOTE — Telephone Encounter (Signed)
See CRM 01/13/17. Patient refusing to schedule appointment.

## 2017-01-13 NOTE — Telephone Encounter (Signed)
See phone note from 01/04/17 -- patient was instructed to come in for a follow-up as I have not seen him since February and the Coumadin Clinic has been managing his medications. I will not fill without seeing him since his last INR at the coumadin clinic was subtherapeutic and I do not agree with their plan to recheck 8 weeks later. Another provider signed off on this plan and no updates have been sent to me via the coumadin clinic. If the coumadin clinic is willing to fill medication under their supervising provider, I am ok with that.

## 2017-01-15 ENCOUNTER — Encounter: Payer: Self-pay | Admitting: Physician Assistant

## 2017-01-21 ENCOUNTER — Ambulatory Visit: Payer: Self-pay | Admitting: Physician Assistant

## 2017-01-21 ENCOUNTER — Encounter: Payer: Self-pay | Admitting: Physician Assistant

## 2017-01-21 ENCOUNTER — Other Ambulatory Visit: Payer: Self-pay

## 2017-01-21 VITALS — BP 129/68 | HR 72 | Temp 98.5°F | Resp 16 | Ht 72.0 in | Wt 172.5 lb

## 2017-01-21 DIAGNOSIS — D689 Coagulation defect, unspecified: Secondary | ICD-10-CM

## 2017-01-21 LAB — COMPREHENSIVE METABOLIC PANEL
ALK PHOS: 50 U/L (ref 39–117)
ALT: 13 U/L (ref 0–53)
AST: 17 U/L (ref 0–37)
Albumin: 4 g/dL (ref 3.5–5.2)
BILIRUBIN TOTAL: 0.4 mg/dL (ref 0.2–1.2)
BUN: 15 mg/dL (ref 6–23)
CO2: 29 mEq/L (ref 19–32)
Calcium: 9.1 mg/dL (ref 8.4–10.5)
Chloride: 106 mEq/L (ref 96–112)
Creatinine, Ser: 0.81 mg/dL (ref 0.40–1.50)
GFR: 104.09 mL/min (ref >60.00)
Glucose, Bld: 90 mg/dL (ref 70–99)
Potassium: 5 mEq/L (ref 3.5–5.1)
Sodium: 138 mEq/L (ref 135–145)
Total Protein: 6.8 g/dL (ref 6.0–8.3)

## 2017-01-21 LAB — LIPID PANEL
CHOLESTEROL: 180 mg/dL (ref 0–200)
HDL: 65.8 mg/dL (ref >39.00)
LDL Cholesterol: 102 mg/dL — ABNORMAL HIGH (ref 0–99)
NonHDL: 114.51
TRIGLYCERIDES: 63 mg/dL (ref 0.0–149.0)
Total CHOL/HDL Ratio: 3
VLDL: 12.6 mg/dL (ref 0.0–40.0)

## 2017-01-21 LAB — POCT INR: INR: 2.1

## 2017-01-21 MED ORDER — WARFARIN SODIUM 5 MG PO TABS: ORAL_TABLET | ORAL | 5 refills | Status: DC

## 2017-01-21 MED ORDER — WARFARIN SODIUM 2 MG PO TABS: ORAL_TABLET | ORAL | 5 refills | Status: DC

## 2017-01-21 NOTE — Patient Instructions (Signed)
Please go to the lab today for blood work.  I will call you with your results. We will alter treatment regimen(s) if indicated by your results.   Please use an over-the-counter Zantac 150 mg to help with reflux and the sensation in the throat. You can take up to twice daily. Try to follow the dietary recommendations below.   Food Choices for Gastroesophageal Reflux Disease, Adult When you have gastroesophageal reflux disease (GERD), the foods you eat and your eating habits are very important. Choosing the right foods can help ease your discomfort. What guidelines do I need to follow?  Choose fruits, vegetables, whole grains, and low-fat dairy products.  Choose low-fat meat, fish, and poultry.  Limit fats such as oils, salad dressings, butter, nuts, and avocado.  Keep a food diary. This helps you identify foods that cause symptoms.  Avoid foods that cause symptoms. These may be different for everyone.  Eat small meals often instead of 3 large meals a day.  Eat your meals slowly, in a place where you are relaxed.  Limit fried foods.  Cook foods using methods other than frying.  Avoid drinking alcohol.  Avoid drinking large amounts of liquids with your meals.  Avoid bending over or lying down until 2-3 hours after eating. What foods are not recommended? These are some foods and drinks that may make your symptoms worse: Vegetables Tomatoes. Tomato juice. Tomato and spaghetti sauce. Chili peppers. Onion and garlic. Horseradish. Fruits Oranges, grapefruit, and lemon (fruit and juice). Meats High-fat meats, fish, and poultry. This includes hot dogs, ribs, ham, sausage, salami, and bacon. Dairy Whole milk and chocolate milk. Sour cream. Cream. Butter. Ice cream. Cream cheese. Drinks Coffee and tea. Bubbly (carbonated) drinks or energy drinks. Condiments Hot sauce. Barbecue sauce. Sweets/Desserts Chocolate and cocoa. Donuts. Peppermint and spearmint. Fats and Oils High-fat  foods. This includes [place] fries and potato chips. Other Vinegar. Strong spices. This includes black pepper, white pepper, red pepper, cayenne, curry powder, cloves, ginger, and chili powder. The items listed above may not be a complete list of foods and drinks to avoid. Contact your dietitian for more information. This information is not intended to replace advice given to you by your health care provider. Make sure you discuss any questions you have with your health care provider. Document Released: 07/21/2011 Document Revised: 06/27/2015 Document Reviewed: 11/23/2012 Elsevier Interactive Patient Education  2017 [employer]

## 2017-01-21 NOTE — Progress Notes (Signed)
Patient presents to clinic today for follow-up  Patient is fasting for labs.  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 Currently on coumadin 7 mg daily. Is followed by the Coumadin Clinic at [facility]. Is overdue for repeat INR. Endorses taking medications as directed without issue. Is consistent with diet.   Health Maintenance: Immunizations -- Declines. Colonoscopy -- Declines any screening. Will reassess once insurance goes into effect next year.  Past Medical History:  Diagnosis Date  . Allergy   . Anxiety   . Clotting disorder ([place])    Unsure if has ever been worked up, per pet has had 9 clots in the past, last was in 2006, both brothers also have clotting issues  . Depression   . DVT (deep venous thrombosis) ([place])     Past Surgical History:  Procedure Laterality Date  . dental implants     x8    Current Outpatient Medications on File Prior to Visit  Medication Sig Dispense Refill  . warfarin (COUMADIN) 2 MG tablet Take as directed by anticoagulation clinic. 7 tablet 0  . warfarin (COUMADIN) 5 MG tablet Take as directed by anticoagulation clinic. 7 tablet 0  . omega-3 acid ethyl esters (LOVAZA) 1 g capsule Take 2 g by mouth daily.     No current facility-administered medications on file prior to visit.     Allergies  Allergen Reactions  . Codeine Other (See Comments)    +synthetic codeine---pancreatitis.   Marland Kitchen Penicillins Rash    Has patient had a PCN reaction causing immediate rash, facial/tongue/throat swelling, SOB or lightheadedness with hypotension: no Has patient had a PCN reaction causing severe rash involving mucus membranes or skin necrosis:yes rash  Has patient had a PCN reaction that required hospitalization no Has patient had a PCN reaction occurring within the last 10 years: yes If all of the above answers are "NO", then may proceed with Cephalosporin use.   . Sulfa Antibiotics Rash     Family History  Problem Relation Age of Onset  . Heart attack Father   . Clotting disorder Brother   . Clotting disorder Brother     Social History   Socioeconomic History  . Marital status: Single    Spouse name: Not on file  . Number of children: 0  . Years of education: Not on file  . Highest education level: Not on file  Social Needs  . Financial resource strain: Not on file  . Food insecurity - worry: Not on file  . Food insecurity - inability: Not on file  . Transportation needs - medical: Not on file  . Transportation needs - non-medical: Not on file  Occupational History  . Occupation: Architect  Tobacco Use  . Smoking status: Former Smoker    Packs/day: 1.00    Types: E-cigarettes, Cigarettes    Last attempt to quit: 01/14/2017    Years since quitting: 0.0  . Smokeless tobacco: Never Used  Substance and Sexual Activity  . Alcohol use: Yes    Comment: daily 4 beers   . Drug use: No  . Sexual activity: Not Currently    Partners: Female    Birth control/protection: Condom  Other Topics Concern  . Not on file  Social History Narrative  . Not on file    Review of Systems  Eyes: Negative for blurred vision and double vision.  Cardiovascular: Negative for chest pain and palpitations.  Gastrointestinal: Positive for heartburn. Negative for abdominal pain,  blood in stool, constipation, diarrhea, melena, nausea and vomiting.       + globus  Genitourinary: Negative for dysuria, flank pain, frequency, hematuria and urgency.  Neurological: Negative for dizziness and loss of consciousness.  Psychiatric/Behavioral: Negative for depression. The patient is not nervous/anxious.    BP 129/68   Pulse 72   Temp 98.5 F (36.9 C) (Oral)   Resp 16   Ht 6' (1.829 m)   Wt 172 lb 8 oz (78.2 kg)   SpO2 96%   BMI 23.40 kg/m   Physical Exam  Constitutional: He is oriented to person, place, and time and well-developed, well-nourished, and in no distress.  HENT:   Head: Normocephalic and atraumatic.  Right Ear: External ear normal.  Left Ear: External ear normal.  Nose: Nose normal.  Mouth/Throat: Oropharynx is clear and moist. No oropharyngeal exudate.  TM within normal limits bilaterally.  Eyes: Conjunctivae are normal. Pupils are equal, round, and reactive to light.  Neck: Neck supple. No thyromegaly present.  Cardiovascular: Normal rate, regular rhythm, normal heart sounds and intact distal pulses.  Pulmonary/Chest: Effort normal and breath sounds normal. No respiratory distress. He has no wheezes. He has no rales. He exhibits no tenderness.  Abdominal: Bowel sounds are normal.  Lymphadenopathy:    He has no cervical adenopathy.  Neurological: He is alert and oriented to person, place, and time.  Skin: Skin is warm and dry.  Psychiatric: Affect normal.  Vitals reviewed.  Recent Results (from the past 2160 hour(s))  Basic metabolic panel     Status: None   Collection Time: 10/30/16  6:23 PM  Result Value Ref Range   Sodium 138 135 - 145 mmol/L   Potassium 4.1 3.5 - 5.1 mmol/L   Chloride 107 101 - 111 mmol/L   CO2 22 22 - 32 mmol/L   Glucose, Bld 95 65 - 99 mg/dL   BUN 13 6 - 20 mg/dL   Creatinine, Ser 0.99 0.61 - 1.24 mg/dL   Calcium 9.1 8.9 - 10.3 mg/dL   GFR calc non Af Amer >60 >60 mL/min   GFR calc Af Amer >60 >60 mL/min    Comment: (NOTE) The eGFR has been calculated using the CKD EPI equation. This calculation has not been validated in all clinical situations. eGFR's persistently <60 mL/min signify possible Chronic Kidney Disease.    Anion gap 9 5 - 15  CBC     Status: None   Collection Time: 10/30/16  6:23 PM  Result Value Ref Range   WBC 8.5 4.0 - 10.5 K/uL   RBC 5.02 4.22 - 5.81 MIL/uL   Hemoglobin 16.5 13.0 - 17.0 g/dL   HCT 47.4 39.0 - 52.0 %   MCV 94.4 78.0 - 100.0 fL   MCH 32.9 26.0 - 34.0 pg   MCHC 34.8 30.0 - 36.0 g/dL   RDW 13.7 11.5 - 15.5 %   Platelets 194 150 - 400 K/uL  Troponin I     Status: None    Collection Time: 10/30/16  6:23 PM  Result Value Ref Range   Troponin I <0.03 <0.03 ng/mL  POCT INR     Status: None   Collection Time: 11/25/16 12:00 AM  Result Value Ref Range   INR 1.8    Assessment/Plan: 1. Clotting disorder ([facility]) POC INR at 2.1. Continue current regimen. Be consistent with diet. Follow-up with coumadin clinic in 4-6 weeks for recheck. Will check Lipid and CMP today as patient is fasting and these have not   been checked. He is self pay and agrees to these labs today. - warfarin (COUMADIN) 2 MG tablet; Take as directed by anticoagulation clinic.  Dispense: 30 tablet; Refill: 5 - warfarin (COUMADIN) 5 MG tablet; Take as directed by anticoagulation clinic.  Dispense: 30 tablet; Refill: 5 - Comp Met (CMET) - Lipid panel - POCT INR    William Cody Martin, PA-C  

## 2017-09-08 ENCOUNTER — Other Ambulatory Visit: Payer: Self-pay

## 2017-09-08 ENCOUNTER — Ambulatory Visit: Payer: Self-pay | Admitting: Physician Assistant

## 2017-09-08 ENCOUNTER — Encounter: Payer: Self-pay | Admitting: Physician Assistant

## 2017-09-08 VITALS — BP 132/72 | HR 72 | Temp 98.6°F | Resp 15 | Ht 72.0 in | Wt 168.2 lb

## 2017-09-08 DIAGNOSIS — I82811 Embolism and thrombosis of superficial veins of right lower extremities: Secondary | ICD-10-CM

## 2017-09-08 LAB — POCT INR: INR: 2 (ref 2.0–3.0)

## 2017-09-08 NOTE — Progress Notes (Signed)
Patient with history of hypercoagulable state 2/2 clotting disorder presents to clinic today c/o painful swollen area of medial anterior R leg x 3 days. Noted after a longer car ride. Denies any leg swelling with this other than at the one site. Denies trauma or injury. Is on chronic anticoagulation with coumadin, taking 7 mg daily. Was followed by the coumadin clinic at our [facility] office but was released due to non-compliance. Last INR at 2.1 in December. Notes he has been taking as directed despite the fact that his refills should have ran out 3 months ago. Notes as of the past 3 days he has doubled his coumadin dose to 14 mg daily due to concern of clot. Denies any gingival bleeding, easy bruising.  Past Medical History:  Diagnosis Date  . Allergy   . Anxiety   . Clotting disorder ([facility])    Unsure if has ever been worked up, per pet has had 9 clots in the past, last was in 2006, both brothers also have clotting issues  . Depression   . DVT (deep venous thrombosis) ([facility])     Current Outpatient Medications on File Prior to Visit  Medication Sig Dispense Refill  . Calcium Carb-Cholecalciferol (CALCIUM 1000 + D PO) Take by mouth.    . magnesium 30 MG tablet Take 30 mg by mouth daily.    Marland Kitchen. omega-3 acid ethyl esters (LOVAZA) 1 g capsule Take 2 g by mouth daily. Takes OTC fish oil    . warfarin (COUMADIN) 2 MG tablet Take as directed by anticoagulation clinic. 30 tablet 5  . warfarin (COUMADIN) 5 MG tablet Take as directed by anticoagulation clinic. 30 tablet 5   No current facility-administered medications on file prior to visit.     Allergies  Allergen Reactions  . Codeine Other (See Comments)    +synthetic codeine---pancreatitis.   Marland Kitchen. Penicillins Rash    Has patient had a PCN reaction causing immediate rash, facial/tongue/throat swelling, SOB or lightheadedness with hypotension: no Has patient had a PCN reaction causing severe rash involving mucus membranes or skin necrosis:yes rash   Has patient had a PCN reaction that required hospitalization no Has patient had a PCN reaction occurring within the last 10 years: yes If all of the above answers are "NO", then may proceed with Cephalosporin use.   . Sulfa Antibiotics Rash    Family History  Problem Relation Age of Onset  . Heart attack Father   . Clotting disorder Brother   . Clotting disorder Brother     Social History   Socioeconomic History  . Marital status: Single    Spouse name: Not on file  . Number of children: 0  . Years of education: Not on file  . Highest education level: Not on file  Occupational History  . Occupation: Training and development officerConstruction  Social Needs  . Financial resource strain: Not on file  . Food insecurity:    Worry: Not on file    Inability: Not on file  . Transportation needs:    Medical: Not on file    Non-medical: Not on file  Tobacco Use  . Smoking status: Former Smoker    Packs/day: 1.00    Types: E-cigarettes, Cigarettes    Last attempt to quit: 01/14/2017    Years since quitting: 0.6  . Smokeless tobacco: Never Used  Substance and Sexual Activity  . Alcohol use: Yes    Comment: daily 4 beers   . Drug use: No  . Sexual activity: Not Currently  Partners: Female    Birth control/protection: Condom  Lifestyle  . Physical activity:    Days per week: Not on file    Minutes per session: Not on file  . Stress: Not on file  Relationships  . Social connections:    Talks on phone: Not on file    Gets together: Not on file    Attends religious service: Not on file    Active member of club or organization: Not on file    Attends meetings of clubs or organizations: Not on file    Relationship status: Not on file  Other Topics Concern  . Not on file  Social History Narrative  . Not on file   Review of Systems - See HPI.  All other ROS are negative.  BP 132/72   Pulse 72   Temp 98.6 F (37 C) (Oral)   Resp 15   Ht 6' (1.829 m)   Wt 168 lb 3.2 oz (76.3 kg)   SpO2 97%    BMI 22.81 kg/m   Physical Exam  Constitutional: He is oriented to person, place, and time. He appears well-developed and well-nourished.  HENT:  Head: Normocephalic and atraumatic.  Eyes: Conjunctivae are normal.  Cardiovascular: Normal rate, regular rhythm, normal heart sounds and intact distal pulses.  Pulmonary/Chest: Effort normal and breath sounds normal. No stridor. No respiratory distress. He has no wheezes. He has no rales. He exhibits no tenderness.  Neurological: He is alert and oriented to person, place, and time.  Skin: Skin is warm.     Vitals reviewed.  Assessment/Plan: 1. Acute superficial venous thrombosis of lower extremity, right Ice, elevation, rest. Compression to the leg recommended. ACE given. Tylenol for pain as he is on anticoagulation. He is refusing [place] for assessment of any DVT. INR today at 2.0. Suspect he has not been taking consistently as directed as he should have been out of medicine > 1 month ago. Will have him take one more dose of 14 mg then resume 7 mg daily. Repeat INR  in 1 week. - POCT INR   Piedad Climes, PA-C

## 2017-09-08 NOTE — Patient Instructions (Signed)
Ice the extremity when resting. Elevate the leg while resting.  Tylenol for pain. Apply compression with a compression stocking or ACE wrap can help with pain.   I want you to keep a close eye on your symptoms. They will slowly improve over the next 1-2 weeks.  Your INR is at 2 today. Recommend you continue increased dose of 14 mg daily for 1 more day, then resuming 7 mg daily dosing. We will need to recheck your INR in 1 week but we will do that here in the office at a lab only appointment. You will not be charged a co-pay or have to see me for this.  Again I cannot tell if there is a smaller deep clot as well without getting Ultrasound. You have refused this today.  Please go to the ER for any worsening symptoms.  Limit your salt intake as it is causing some mild swelling bilaterally.

## 2017-09-15 ENCOUNTER — Other Ambulatory Visit: Payer: Self-pay

## 2017-09-15 ENCOUNTER — Encounter: Payer: Self-pay | Admitting: Physician Assistant

## 2017-09-15 ENCOUNTER — Ambulatory Visit: Payer: Self-pay | Admitting: Physician Assistant

## 2017-09-15 VITALS — BP 118/78 | HR 66 | Temp 98.6°F | Resp 16 | Ht 72.0 in | Wt 165.4 lb

## 2017-09-15 DIAGNOSIS — I82811 Embolism and thrombosis of superficial veins of right lower extremities: Secondary | ICD-10-CM

## 2017-09-15 LAB — POCT INR: INR: 3.3 — AB (ref 2.0–3.0)

## 2017-09-15 NOTE — Progress Notes (Signed)
Patient presents to clinic today for follow-up of superficial thrombophlebitis of RLE. Has been following supportive measures. Notes complete resolution of pain. Can still feel the small thrombus but feels smaller to him. Is taking his coumadin as directed. Denies fever, chills, leg swelling. Is due for repeat INR today.  Past Medical History:  Diagnosis Date  . Allergy   . Anxiety   . Clotting disorder ([facility])    Unsure if has ever been worked up, per pet has had 9 clots in the past, last was in 2006, both brothers also have clotting issues  . Depression   . DVT (deep venous thrombosis) ([facility])     Current Outpatient Medications on File Prior to Visit  Medication Sig Dispense Refill  . Calcium Carb-Cholecalciferol (CALCIUM 1000 + D PO) Take by mouth.    . magnesium 30 MG tablet Take 30 mg by mouth daily.    Marland Kitchen. omega-3 acid ethyl esters (LOVAZA) 1 g capsule Take 2 g by mouth daily. Takes OTC fish oil    . warfarin (COUMADIN) 2 MG tablet Take as directed by anticoagulation clinic. 30 tablet 5  . warfarin (COUMADIN) 5 MG tablet Take as directed by anticoagulation clinic. 30 tablet 5   No current facility-administered medications on file prior to visit.     Allergies  Allergen Reactions  . Codeine Other (See Comments)    +synthetic codeine---pancreatitis.   Marland Kitchen. Penicillins Rash    Has patient had a PCN reaction causing immediate rash, facial/tongue/throat swelling, SOB or lightheadedness with hypotension: no Has patient had a PCN reaction causing severe rash involving mucus membranes or skin necrosis:yes rash  Has patient had a PCN reaction that required hospitalization no Has patient had a PCN reaction occurring within the last 10 years: yes If all of the above answers are "NO", then may proceed with Cephalosporin use.   . Sulfa Antibiotics Rash    Family History  Problem Relation Age of Onset  . Heart attack Father   . Clotting disorder Brother   . Clotting disorder Brother      Social History   Socioeconomic History  . Marital status: Single    Spouse name: Not on file  . Number of children: 0  . Years of education: Not on file  . Highest education level: Not on file  Occupational History  . Occupation: Training and development officerConstruction  Social Needs  . Financial resource strain: Not on file  . Food insecurity:    Worry: Not on file    Inability: Not on file  . Transportation needs:    Medical: Not on file    Non-medical: Not on file  Tobacco Use  . Smoking status: Former Smoker    Packs/day: 1.00    Types: E-cigarettes, Cigarettes    Last attempt to quit: 01/14/2017    Years since quitting: 0.6  . Smokeless tobacco: Never Used  Substance and Sexual Activity  . Alcohol use: Yes    Comment: daily 4 beers   . Drug use: No  . Sexual activity: Not Currently    Partners: Female    Birth control/protection: Condom  Lifestyle  . Physical activity:    Days per week: Not on file    Minutes per session: Not on file  . Stress: Not on file  Relationships  . Social connections:    Talks on phone: Not on file    Gets together: Not on file    Attends religious service: Not on file  Active member of club or organization: Not on file    Attends meetings of clubs or organizations: Not on file    Relationship status: Not on file  Other Topics Concern  . Not on file  Social History Narrative  . Not on file   Review of Systems - See HPI.  All other ROS are negative.  BP 118/78   Pulse 66   Temp 98.6 F (37 C) (Oral)   Resp 16   Ht 6' (1.829 m)   Wt 165 lb 6.4 oz (75 kg)   SpO2 94%   BMI 22.43 kg/m   Physical Exam  Constitutional: He is oriented to person, place, and time. He appears well-developed and well-nourished.  HENT:  Head: Normocephalic and atraumatic.  Cardiovascular: Normal rate, regular rhythm, normal heart sounds and intact distal pulses.  Pulses:      Dorsalis pedis pulses are 2+ on the right side, and 2+ on the left side.       Posterior  tibial pulses are 2+ on the right side, and 2+ on the left side.  Area of phlebitis now within normal limits. No remnant erythema, warmth or tenderness. Thrombus palpable. Swelling resolved.  Pulmonary/Chest: Effort normal and breath sounds normal.  Neurological: He is alert and oriented to person, place, and time.  Vitals reviewed.   Recent Results (from the past 2160 hour(s))  POCT INR     Status: Abnormal   Collection Time: 09/08/17  1:52 PM  Result Value Ref Range   INR 2.0 2.0 - 3.0  POCT INR     Status: Abnormal   Collection Time: 09/15/17  8:39 AM  Result Value Ref Range   INR 3.3 (A) 2.0 - 3.0   Assessment/Plan: 1. Acute superficial venous thrombosis of lower extremity, right Continue supportive measures. Will monitor. INR at 3.3 today. He is to hold today's dose and resume 7 mg daily. Repeat INR 2 weeks. Will try to get him set up for home INR monitor. Discussed alternatives to the coumadin. Unfortunately without insurance these are too costly at present.  - POCT INR   Piedad ClimesWilliam Cody Kristiana Jacko, PA-C

## 2017-09-15 NOTE — Patient Instructions (Signed)
Please keep well-hydrated and get plenty of rest.  I am glad you are doing well today. Hold today's coumadin dose. Restart regular dose today. Repeat INR in 2 weeks. I am working on finding you an INR machine at home.

## 2017-09-20 ENCOUNTER — Ambulatory Visit: Payer: Self-pay | Admitting: General Practice

## 2017-09-27 ENCOUNTER — Other Ambulatory Visit: Payer: Self-pay | Admitting: Emergency Medicine

## 2017-09-27 DIAGNOSIS — D689 Coagulation defect, unspecified: Secondary | ICD-10-CM

## 2017-09-27 MED ORDER — WARFARIN SODIUM 5 MG PO TABS: ORAL_TABLET | ORAL | 5 refills | Status: DC

## 2017-09-27 MED ORDER — WARFARIN SODIUM 2 MG PO TABS: ORAL_TABLET | ORAL | 5 refills | Status: DC

## 2017-09-27 NOTE — Telephone Encounter (Signed)
Patient needs refills on Warfarin, he states that he had doubled up on his medications when he thought he had a blood clot.   Please advise.   Kathi SimpersAmy Peterman,  LPN

## 2018-02-10 ENCOUNTER — Ambulatory Visit
Admission: RE | Admit: 2018-02-10 | Discharge: 2018-02-10 | Disposition: A | Discharge status: Home / Self Care | Payer: Self-pay | Source: Ambulatory Visit | Attending: Otolaryngology | Admitting: Otolaryngology

## 2018-02-10 ENCOUNTER — Other Ambulatory Visit: Payer: Self-pay

## 2018-02-10 ENCOUNTER — Ambulatory Visit: Payer: Self-pay | Admitting: Physician Assistant

## 2018-02-10 ENCOUNTER — Encounter: Payer: Self-pay | Admitting: Physician Assistant

## 2018-02-10 ENCOUNTER — Other Ambulatory Visit: Payer: Self-pay | Admitting: Otolaryngology

## 2018-02-10 VITALS — BP 110/60 | HR 52 | Temp 97.7°F | Resp 16 | Ht 72.0 in | Wt 179.0 lb

## 2018-02-10 DIAGNOSIS — R220 Localized swelling, mass and lump, head: Secondary | ICD-10-CM

## 2018-02-10 DIAGNOSIS — R05 Cough: Secondary | ICD-10-CM

## 2018-02-10 DIAGNOSIS — R1310 Dysphagia, unspecified: Secondary | ICD-10-CM

## 2018-02-10 DIAGNOSIS — Z7901 Long term (current) use of anticoagulants: Secondary | ICD-10-CM

## 2018-02-10 DIAGNOSIS — K148 Other diseases of tongue: Secondary | ICD-10-CM

## 2018-02-10 DIAGNOSIS — R059 Cough, unspecified: Secondary | ICD-10-CM

## 2018-02-10 DIAGNOSIS — Z5181 Encounter for therapeutic drug level monitoring: Secondary | ICD-10-CM

## 2018-02-10 LAB — POCT INR: INR: 2.1 (ref 2.0–3.0)

## 2018-02-10 IMAGING — CR DG CHEST 2V
2 series · 2 of 2 positions shown · non-contrast
Comparison: [DATE]

CLINICAL DATA: Upper chest congestion for 2 weeks

EXAM:
CHEST - 2 VIEW

[w chest pa]
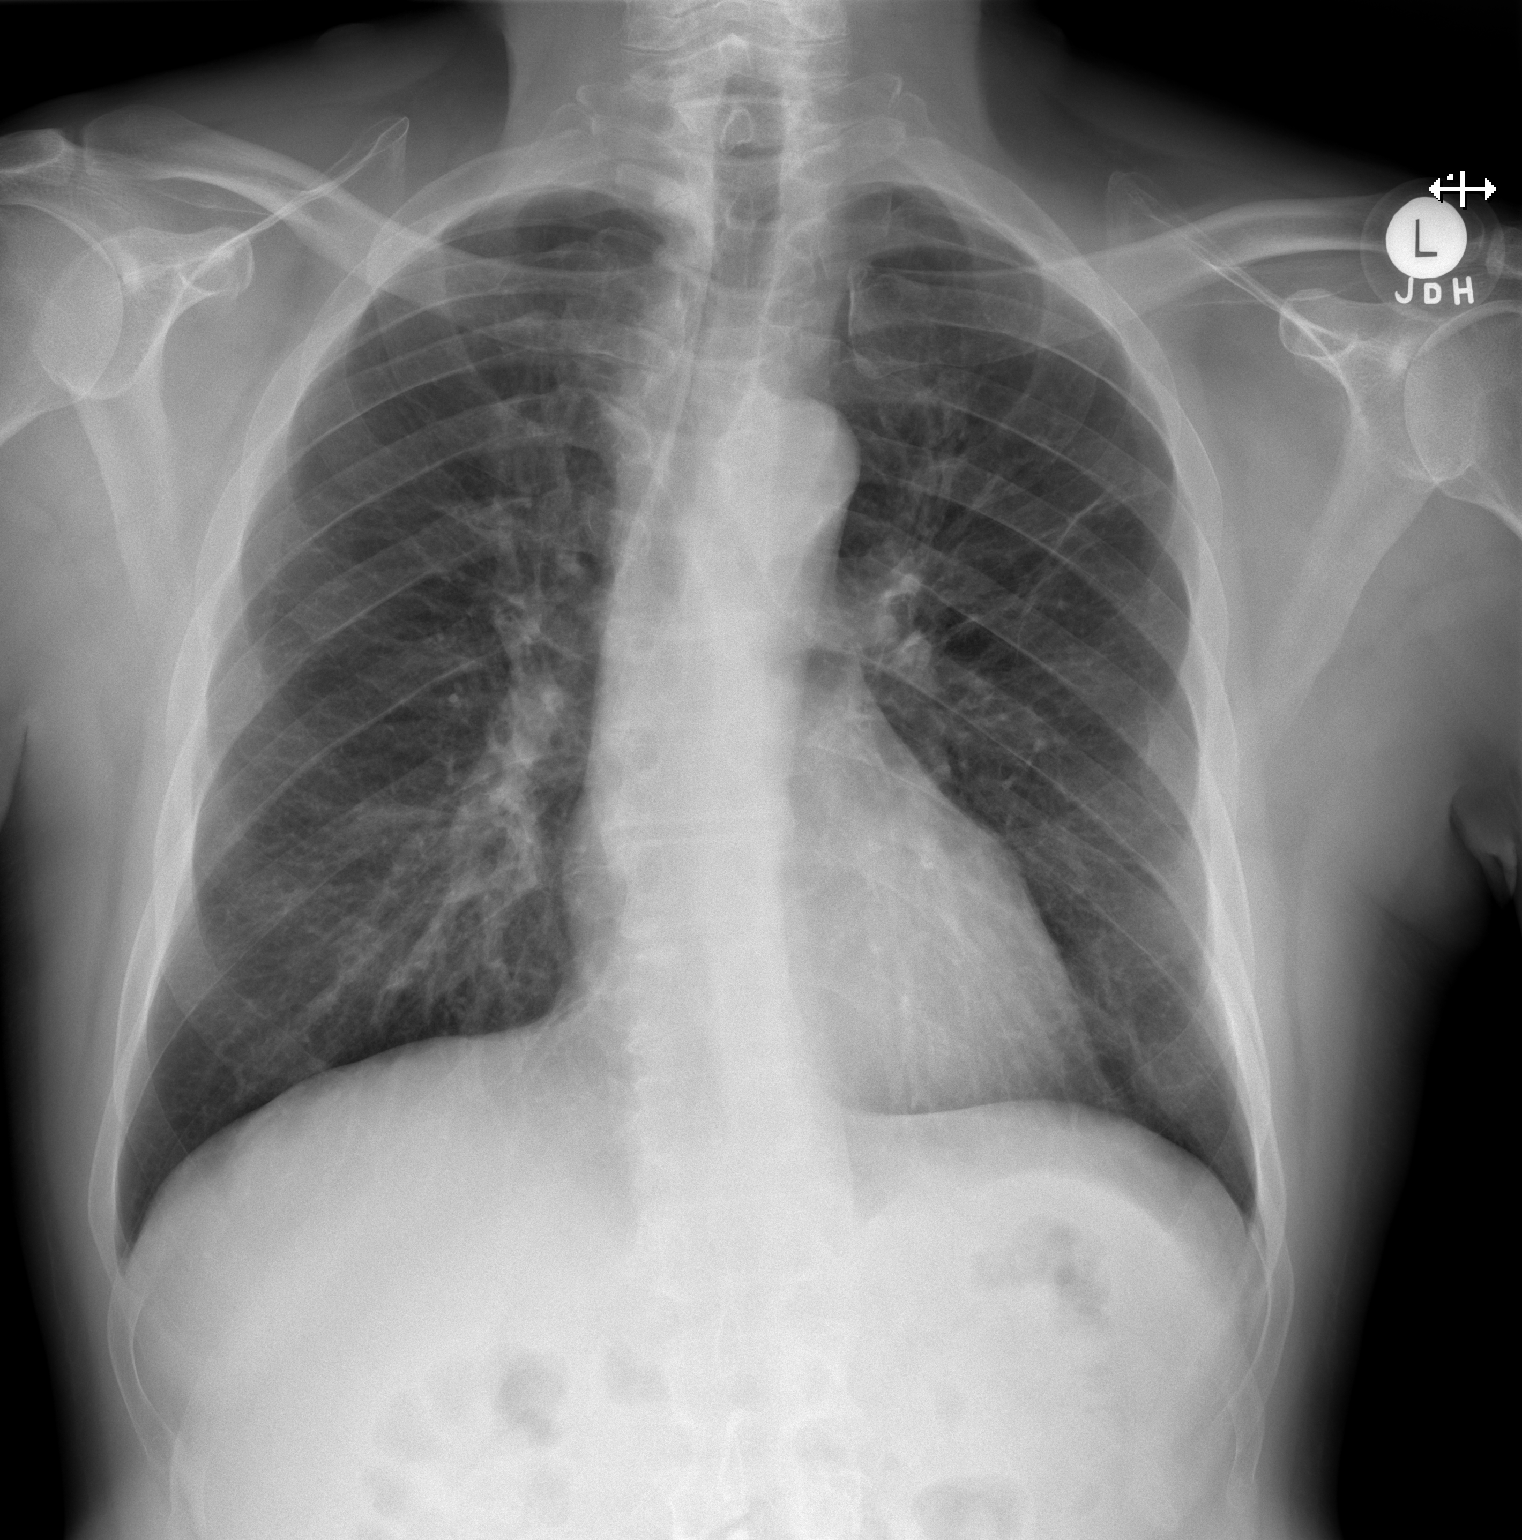

[w chest lat]
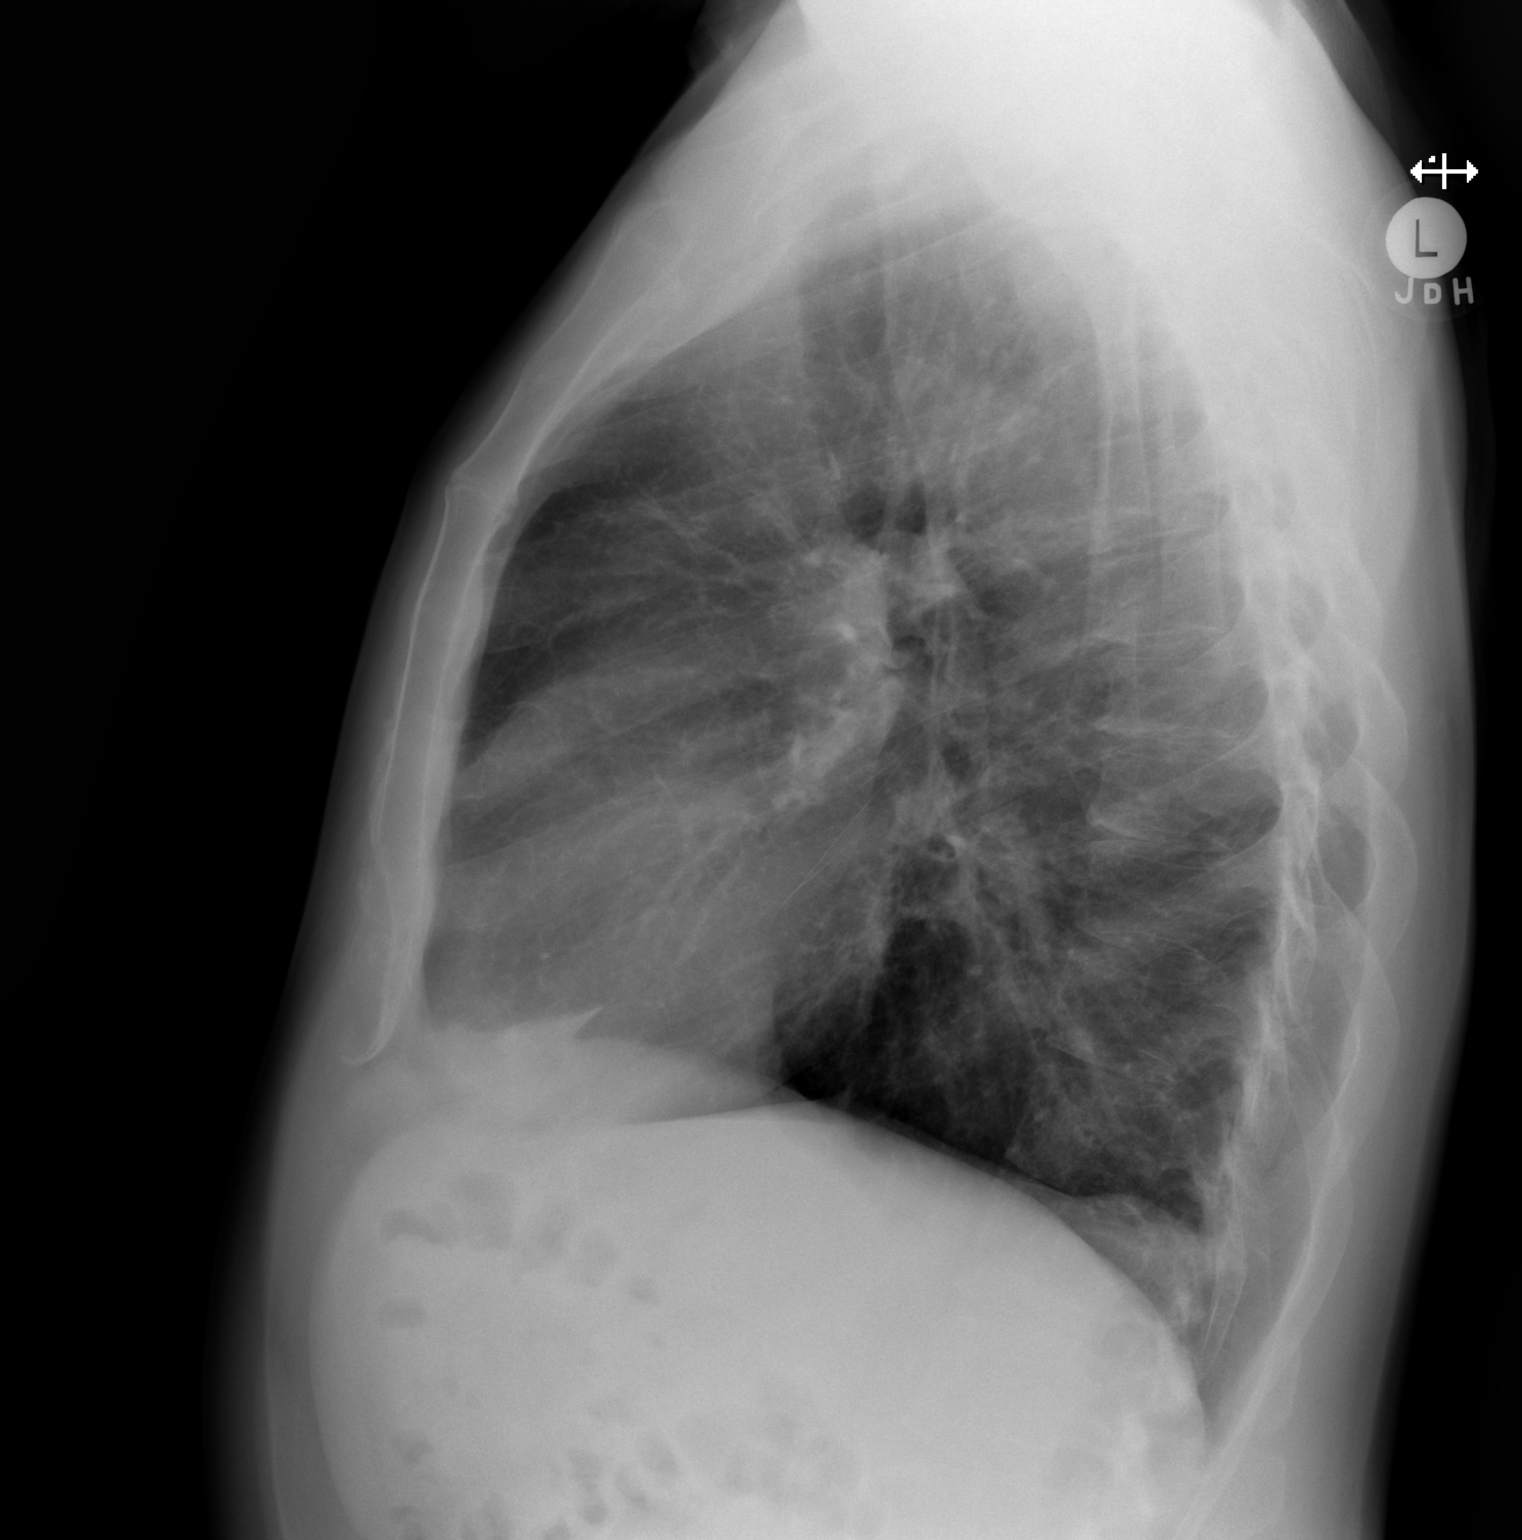

[2 of 2 positions shown; findings below may reference images not displayed]

FINDINGS: Normal heart size. Linear scarring in the left upper lobe. Lungs
otherwise clear. No pneumothorax. Chronic pleural changes at the
posterior right lung base. No pleural effusion.
IMPRESSION: No active cardiopulmonary disease.

## 2018-02-10 NOTE — Patient Instructions (Addendum)
Continue current coumadin regimen.  Follow-up 6 weeks for repeat INR.  Please go up front to speak with Marylene LandAngela regarding your ENT appointment. We are getting this set up ASAP.

## 2018-02-10 NOTE — Progress Notes (Signed)
Patient presents to clinic today c/o 1 month of sore throat now with odynophagia and dysphagia. Notes a globus sensation in his throat. Denies nausea/vomiting. Did cough up a stringy like brown substance at christmas. Denies fever, chills, ear pain, sinus pain or tooth pain. Notes dysphagia with solids and liquids. Has history of alcohol abuse with daily use and tobacco use disorder. Noted history of GERD as well but denies current symptoms.   Past Medical History:  Diagnosis Date  . Allergy   . Anxiety   . Clotting disorder (HCC)    Unsure if has ever been worked up, per pet has had 9 clots in the past, last was in 2006, both brothers also have clotting issues  . Depression   . DVT (deep venous thrombosis) (HCC)     Current Outpatient Medications on File Prior to Visit  Medication Sig Dispense Refill  . warfarin (COUMADIN) 2 MG tablet Take as directed by anticoagulation clinic. 30 tablet 5  . warfarin (COUMADIN) 5 MG tablet Take as directed by anticoagulation clinic. 30 tablet 5   No current facility-administered medications on file prior to visit.     Allergies  Allergen Reactions  . Codeine Other (See Comments)    +synthetic codeine---pancreatitis.   Marland Kitchen Penicillins Rash    Has patient had a PCN reaction causing immediate rash, facial/tongue/throat swelling, SOB or lightheadedness with hypotension: no Has patient had a PCN reaction causing severe rash involving mucus membranes or skin necrosis:yes rash  Has patient had a PCN reaction that required hospitalization no Has patient had a PCN reaction occurring within the last 10 years: yes If all of the above answers are "NO", then may proceed with Cephalosporin use.   . Sulfa Antibiotics Rash    Family History  Problem Relation Age of Onset  . Heart attack Father   . Clotting disorder Brother   . Clotting disorder Brother     Social History   Socioeconomic History  . Marital status: Single    Spouse name: Not on file    . Number of children: 0  . Years of education: Not on file  . Highest education level: Not on file  Occupational History  . Occupation: Training and development officer  . Financial resource strain: Not on file  . Food insecurity:    Worry: Not on file    Inability: Not on file  . Transportation needs:    Medical: Not on file    Non-medical: Not on file  Tobacco Use  . Smoking status: Former Smoker    Packs/day: 1.00    Types: E-cigarettes, Cigarettes    Last attempt to quit: 01/14/2017    Years since quitting: 1.0  . Smokeless tobacco: Never Used  Substance and Sexual Activity  . Alcohol use: Yes    Comment: daily 4 beers   . Drug use: No  . Sexual activity: Not Currently    Partners: Female    Birth control/protection: Condom  Lifestyle  . Physical activity:    Days per week: Not on file    Minutes per session: Not on file  . Stress: Not on file  Relationships  . Social connections:    Talks on phone: Not on file    Gets together: Not on file    Attends religious service: Not on file    Active member of club or organization: Not on file    Attends meetings of clubs or organizations: Not on file    Relationship status:  Not on file  Other Topics Concern  . Not on file  Social History Narrative  . Not on file   Review of Systems - See HPI.  All other ROS are negative.  BP 110/60   Pulse (!) 52   Temp 97.7 F (36.5 C) (Oral)   Resp 16   Ht 6' (1.829 m)   Wt 179 lb (81.2 kg)   SpO2 98%   BMI 24.28 kg/m   Physical Exam Vitals signs reviewed.  Constitutional:      Appearance: He is well-developed.  HENT:     Head: Normocephalic and atraumatic.  Neurological:     Mental Status: He is alert.      Assessment/Plan: 1. Encounter for therapeutic drug monitoring 2. Long term (current) use of anticoagulants Overdue for check. Is taking medications as directed. INR in range. Continue current regimen. Follow-up 6 weeks for repeat INR. - POCT INR  3.  Odynophagia 4. Mass of tongue 1 month of symptoms, worsening. Darker mass at base of tongue, visible on examination, needing further assessment along with scoping. Will set up urgently with ENT due to concern for cancerous mass. Appt scheduled today with Dr. Haroldine Lawsrossley. - Ambulatory referral to ENT   Piedad ClimesWilliam Cody Marco Adelson, PA-C

## 2018-02-18 ENCOUNTER — Telehealth: Payer: Self-pay | Admitting: Physician Assistant

## 2018-02-18 NOTE — Telephone Encounter (Signed)
Received message from pharmacy that patient was given Diflucan by another provider. Can impact INR levels. Recommend he have rechecked on Monday of next week at lab only visit.

## 2018-02-18 NOTE — Telephone Encounter (Signed)
LMOVM advising patient the Diflucan the ENT prescribed can affect his INR level. PCP wants him to schedule a lab appointment for INR levels on Monday.

## 2018-02-24 NOTE — Telephone Encounter (Signed)
Pt returned call your call, pt says that he took the medication but never received a refill so he dont think that medication is still in his system.   Pt doesn't feel that he need to have a lab completed.

## 2018-02-24 NOTE — Telephone Encounter (Signed)
FYI. Patient declined lab visit to recheck INR level after taking Diflucan Please advise

## 2018-02-24 NOTE — Telephone Encounter (Signed)
LMOVM advising patient to call the office to schedule a lab appointment for INR recheck due to ENT starting Diflucan.

## 2018-02-25 NOTE — Telephone Encounter (Signed)
Remind him that if he is refusing, he still needs to return in about 2 weeks for routine INR check. This is a requirement in order to continue prescribing his Warfarin. I still highly recommend he let us recheck while on the Diflucan, especially if he is still taking.

## 2018-03-03 NOTE — Telephone Encounter (Signed)
LMOVM advising patient that he is still due for a repeat INR recheck in 2 weeks. To continue the Warfarin he still needs a recheck

## 2018-04-26 ENCOUNTER — Telehealth: Payer: Self-pay | Admitting: Physician Assistant

## 2018-04-26 DIAGNOSIS — Z7901 Long term (current) use of anticoagulants: Secondary | ICD-10-CM

## 2018-04-26 NOTE — Telephone Encounter (Signed)
No refills without INR check. He is horribly noncompliant with this. If not I will not continue to prescribe.

## 2018-04-26 NOTE — Telephone Encounter (Signed)
Pt needs a refill on the Warfarin 2mg  and 5mg  tabs. Pt uses [facility] on [address]. Pt can be reached at the home #

## 2018-04-28 NOTE — Telephone Encounter (Signed)
Patient is coming in Monday for LAB ONLY visit. I will go to his car to draw labs.

## 2018-04-28 NOTE — Addendum Note (Signed)
Addended by: Con Memos on: 04/28/2018 02:55 PM   Modules accepted: Orders

## 2018-05-02 ENCOUNTER — Other Ambulatory Visit (INDEPENDENT_AMBULATORY_CARE_PROVIDER_SITE_OTHER): Payer: Self-pay

## 2018-05-02 DIAGNOSIS — Z7901 Long term (current) use of anticoagulants: Secondary | ICD-10-CM

## 2018-05-02 LAB — PROTIME-INR
INR: 1.6 ratio — ABNORMAL HIGH (ref 0.8–1.0)
Prothrombin Time: 18.5 s — ABNORMAL HIGH (ref 9.6–13.1)

## 2018-05-04 ENCOUNTER — Other Ambulatory Visit: Payer: Self-pay | Admitting: Emergency Medicine

## 2018-05-04 DIAGNOSIS — D689 Coagulation defect, unspecified: Secondary | ICD-10-CM

## 2018-05-04 MED ORDER — WARFARIN SODIUM 5 MG PO TABS: ORAL_TABLET | ORAL | 3 refills | Status: DC

## 2018-05-04 MED ORDER — WARFARIN SODIUM 2 MG PO TABS: ORAL_TABLET | ORAL | 3 refills | Status: DC

## 2018-05-20 ENCOUNTER — Ambulatory Visit (INDEPENDENT_AMBULATORY_CARE_PROVIDER_SITE_OTHER): Payer: Self-pay | Admitting: Physician Assistant

## 2018-05-20 ENCOUNTER — Encounter: Payer: Self-pay | Admitting: Physician Assistant

## 2018-05-20 ENCOUNTER — Other Ambulatory Visit: Payer: Self-pay

## 2018-05-20 VITALS — Wt 179.0 lb

## 2018-05-20 DIAGNOSIS — M5441 Lumbago with sciatica, right side: Secondary | ICD-10-CM

## 2018-05-20 MED ORDER — METHYLPREDNISOLONE 4 MG PO TBPK: ORAL_TABLET | ORAL | 0 refills | Status: DC

## 2018-05-20 MED ORDER — GABAPENTIN 100 MG PO CAPS: 100.0000 mg | ORAL_CAPSULE | Freq: Every day | ORAL | 0 refills | Status: DC

## 2018-05-20 NOTE — Progress Notes (Signed)
I have discussed the procedure for the virtual visit with the patient who has given consent to proceed with assessment and treatment.   Shanna Un S Claborn Janusz, CMA     

## 2018-05-20 NOTE — Progress Notes (Signed)
Virtual Visit via Video   I connected with patient on 05/20/18 at  1:00 PM EDT by a video enabled telemedicine application and verified that I am speaking with the correct person using two identifiers.  Location patient: Home Location provider: [facility], Office Persons participating in the virtual visit: Patient, Provider, CMA (Patina Moore)  I discussed the limitations of evaluation and management by telemedicine and the availability of in person appointments. The patient expressed understanding and agreed to proceed.  Subjective:   HPI:       Patient presents via [facility] today c/o 2 weeks of bilateral lower back pain now with radiation into RLE. Notes pain is worse with standing and in certain positions. Is alleviated a bit with walking and significantly improves with laying down. Denies numbness, tingling or weakness of RLE. Denies saddle anesthesia or change to bowel/bladder habits. Has been taking Tylenol with mild relief of symptoms. Notes he has prior history of sciatica but has not had issue in years do to practicing yoga. Has not been able to do as much of this the past couple of months and feels this may be a contributing factor.  ROS:   See pertinent positives and negatives per HPI.  Patient Active Problem List   Diagnosis Date Noted  . Clotting disorder (HCC) 01/21/2017  . Encounter for therapeutic drug monitoring 04/07/2016  . Alcohol abuse, daily use 12/20/2015  . LPRD (laryngopharyngeal reflux disease) 12/20/2015  . DVT (deep venous thrombosis) (HCC) 11/23/2013    Social History   Tobacco Use  . Smoking status: Former Smoker    Packs/day: 1.00    Types: E-cigarettes, Cigarettes    Last attempt to quit: 01/14/2017    Years since quitting: 1.3  . Smokeless tobacco: Never Used  Substance Use Topics  . Alcohol use: Yes    Comment: daily 4 beers     Current Outpatient Medications:  .  warfarin (COUMADIN) 2 MG tablet, Take as directed by  anticoagulation clinic., Disp: 30 tablet, Rfl: 3 .  warfarin (COUMADIN) 5 MG tablet, Take as directed by anticoagulation clinic., Disp: 30 tablet, Rfl: 3 .  gabapentin (NEURONTIN) 100 MG capsule, Take 1 capsule (100 mg total) by mouth at bedtime., Disp: 15 capsule, Rfl: 0 .  methylPREDNISolone (MEDROL) 4 MG TBPK tablet, Take following package directions., Disp: 21 tablet, Rfl: 0  Allergies  Allergen Reactions  . Codeine Other (See Comments)    +synthetic codeine---pancreatitis.   Marland Kitchen Penicillins Rash    Has patient had a PCN reaction causing immediate rash, facial/tongue/throat swelling, SOB or lightheadedness with hypotension: no Has patient had a PCN reaction causing severe rash involving mucus membranes or skin necrosis:yes rash  Has patient had a PCN reaction that required hospitalization no Has patient had a PCN reaction occurring within the last 10 years: yes If all of the above answers are "NO", then may proceed with Cephalosporin use.   . Sulfa Antibiotics Rash    Objective:   Wt 179 lb (81.2 kg)   BMI 24.28 kg/m   Patient is well-developed, well-nourished in no acute distress.  Resting in bed of truck at home.  Head is normocephalic, atraumatic.  No labored breathing.  Speech is clear and coherent with logical contest.  Patient is alert and oriented at baseline.  Able to ambulate without difficulty on exam.   Assessment and Plan:    1. Acute right-sided low back pain with right-sided sciatica No alarm signs/symptoms. Start medrol dose pack. Flexeril in the evening.  Tylenol for breakthrough pain. Avoid heavy lifting and overexertion. Strict return/ER precautions reviewed with patient.  - methylPREDNISolone (MEDROL) 4 MG TBPK tablet; Take following package directions.  Dispense: 21 tablet; Refill: 0 - gabapentin (NEURONTIN) 100 MG capsule; Take 1 capsule (100 mg total) by mouth at bedtime.  Dispense: 15 capsule; Refill: 0    Piedad ClimesWilliam Cody Zohra Clavel, PA-C 05/20/2018  Time  spent with the patient: 15 minutes, of which >50% was spent in obtaining information about symptoms, reviewing previous labs, evaluations, and treatments, counseling about condition (please see the discussed topics above), and developing a plan to further investigate it; had a number of questions which I addressed.

## 2018-06-22 ENCOUNTER — Encounter: Payer: Self-pay | Admitting: Physician Assistant

## 2018-06-22 ENCOUNTER — Other Ambulatory Visit: Payer: Self-pay

## 2018-06-22 ENCOUNTER — Ambulatory Visit (INDEPENDENT_AMBULATORY_CARE_PROVIDER_SITE_OTHER): Payer: Self-pay | Admitting: Physician Assistant

## 2018-06-22 VITALS — BP 120/70 | HR 53 | Temp 98.2°F | Resp 16 | Ht 72.0 in | Wt 181.0 lb

## 2018-06-22 DIAGNOSIS — M5442 Lumbago with sciatica, left side: Secondary | ICD-10-CM

## 2018-06-22 DIAGNOSIS — N410 Acute prostatitis: Secondary | ICD-10-CM

## 2018-06-22 DIAGNOSIS — Z7901 Long term (current) use of anticoagulants: Secondary | ICD-10-CM

## 2018-06-22 DIAGNOSIS — M5441 Lumbago with sciatica, right side: Secondary | ICD-10-CM

## 2018-06-22 DIAGNOSIS — G8929 Other chronic pain: Secondary | ICD-10-CM

## 2018-06-22 LAB — POCT URINALYSIS DIPSTICK
Bilirubin, UA: NEGATIVE
Glucose, UA: NEGATIVE
Leukocytes, UA: NEGATIVE
Nitrite, UA: NEGATIVE
Protein, UA: NEGATIVE
Spec Grav, UA: >=1.03 — AB (ref 1.010–1.025)
Urobilinogen, UA: 0.2 E.U./dL
pH, UA: 6 (ref 5.0–8.0)

## 2018-06-22 LAB — POCT INR: INR: 1.3 — AB (ref 2.0–3.0)

## 2018-06-22 MED ORDER — TAMSULOSIN HCL 0.4 MG PO CAPS: 0.4000 mg | ORAL_CAPSULE | Freq: Every day | ORAL | 0 refills | Status: DC

## 2018-06-22 MED ORDER — CIPROFLOXACIN HCL 500 MG PO TABS: 500.0000 mg | ORAL_TABLET | Freq: Two times a day (BID) | ORAL | 0 refills | Status: DC

## 2018-06-22 NOTE — Progress Notes (Signed)
Patient presents to clinic today c/o a few days of dysuria and decreased urinary stream with an episode of hematuria last night. Denies fever, chills, flank pain, nausea or vomiting. Denies anuria just significant hesitancy. Does note urgency and frequency as well. Denies penile pain, discharge or concern for STI. Has history of BPH symptoms and prostatitis.   Patient also endorses continued intermittent issue with lumbar back. Noting intermittent sciatica that was initially kept at bay with yoga but now with breakthrough symptoms. Was most recently seen for this on 05/20/2018, at which time he was started on a steroid pack and muscle relaxant. Notes symptoms calmed down some but continue to be intermittent. None currently but notes this changes on a daily basis.  Patient also overdue for INR check. He has been significantly non-adherent with this. Is currently on a regimen of Warfarin 7 mg daily. Endorses taking daily as directed and tolerating well. Is eating a lot of greens and other vegetables.   Past Medical History:  Diagnosis Date  . Allergy   . Anxiety   . Clotting disorder (HCC)    Unsure if has ever been worked up, per pet has had 9 clots in the past, last was in 2006, both brothers also have clotting issues  . Depression   . DVT (deep venous thrombosis) (HCC)     Current Outpatient Medications on File Prior to Visit  Medication Sig Dispense Refill  . gabapentin (NEURONTIN) 100 MG capsule Take 1 capsule (100 mg total) by mouth at bedtime. 15 capsule 0  . warfarin (COUMADIN) 2 MG tablet Take as directed by anticoagulation clinic. 30 tablet 3  . warfarin (COUMADIN) 5 MG tablet Take as directed by anticoagulation clinic. 30 tablet 3   No current facility-administered medications on file prior to visit.     Allergies  Allergen Reactions  . Codeine Other (See Comments)    +synthetic codeine---pancreatitis.   Marland Kitchen Penicillins Rash    Has patient had a PCN reaction causing  immediate rash, facial/tongue/throat swelling, SOB or lightheadedness with hypotension: no Has patient had a PCN reaction causing severe rash involving mucus membranes or skin necrosis:yes rash  Has patient had a PCN reaction that required hospitalization no Has patient had a PCN reaction occurring within the last 10 years: yes If all of the above answers are "NO", then may proceed with Cephalosporin use.   . Sulfa Antibiotics Rash    Family History  Problem Relation Age of Onset  . Heart attack Father   . Clotting disorder Brother   . Clotting disorder Brother     Social History   Socioeconomic History  . Marital status: Single    Spouse name: Not on file  . Number of children: 0  . Years of education: Not on file  . Highest education level: Not on file  Occupational History  . Occupation: Training and development officer  . Financial resource strain: Not on file  . Food insecurity:    Worry: Not on file    Inability: Not on file  . Transportation needs:    Medical: Not on file    Non-medical: Not on file  Tobacco Use  . Smoking status: Former Smoker    Packs/day: 1.00    Types: E-cigarettes, Cigarettes    Last attempt to quit: 01/14/2017    Years since quitting: 1.4  . Smokeless tobacco: Never Used  Substance and Sexual Activity  . Alcohol use: Yes    Comment: daily 4 beers   .  Drug use: No  . Sexual activity: Not Currently    Partners: Female    Birth control/protection: Condom  Lifestyle  . Physical activity:    Days per week: Not on file    Minutes per session: Not on file  . Stress: Not on file  Relationships  . Social connections:    Talks on phone: Not on file    Gets together: Not on file    Attends religious service: Not on file    Active member of club or organization: Not on file    Attends meetings of clubs or organizations: Not on file    Relationship status: Not on file  Other Topics Concern  . Not on file  Social History Narrative  . Not on  file   Review of Systems - See HPI.  All other ROS are negative.  BP 120/70   Pulse (!) 53   Temp 98.2 F (36.8 C) (Skin)   Resp 16   Ht 6' (1.829 m)   Wt 181 lb (82.1 kg)   SpO2 99%   BMI 24.55 kg/m   Physical Exam  Recent Results (from the past 2160 hour(s))  Protime-INR ( [facility] ONLY)     Status: Abnormal   Collection Time: 05/02/18  9:05 AM  Result Value Ref Range   INR 1.6 (H) 0.8 - 1.0 ratio   Prothrombin Time 18.5 (H) 9.6 - 13.1 sec    Assessment/Plan: 1. Acute prostatitis Symptoms consistent with mild prostatitis which he has history of. Denies concern for STI. Urine sent for culture. Will start Flomax and Cipro. Will alter regimen based on culture results and response to treatment. If not improving, will need to schedule follow-up with his Urologist. Would need full workup at that time giving hematuria noted last night.   - POCT Urinalysis Dipstick - Urine Culture - ciprofloxacin (CIPRO) 500 MG tablet; Take 1 tablet (500 mg total) by mouth 2 (two) times daily.  Dispense: 20 tablet; Refill: 0 - tamsulosin (FLOMAX) 0.4 MG CAPS capsule; Take 1 capsule (0.4 mg total) by mouth daily.  Dispense: 15 capsule; Refill: 0  2. Long term (current) use of anticoagulants Overdue for INR check. Endorses taking as directed. INR today subtherapeutic. Will continue current dose for now as he is being started on antibiotic that can increase INR levels. Recheck in 1 week in office.  - POCT INR  3. Chronic bilateral low back pain with bilateral sciatica Asymptomatic at present. Yoga helps some but symptoms still flaring frequently. Will proceed with repeat MRI as last imaging was > 5 years ago and showed herniation at that point per patient. No alarm signs/symptoms at present.    Piedad ClimesWilliam Cody Shandell Jallow, PA-C

## 2018-06-22 NOTE — Patient Instructions (Signed)
Please keep hydrated. Start the Flomax at night over the weekend to help make it easier to urinate as we calm down swelling in the prostate. Take the Cipro as directed. Continue current dose of Warfarin for now as the Cipro can increase INR level and yours is currently low.   Follow-up with nurse in 1 week for INR recheck. If symptoms are not improving we will need to get you in with Urology.  If you become unable to pee, please go to the ER.  We will work on getting your MRI scheduled and work on some financial assistance options.

## 2018-06-24 LAB — URINE CULTURE
MICRO NUMBER:: 491920
Result:: NO GROWTH
SPECIMEN QUALITY:: ADEQUATE

## 2018-06-30 ENCOUNTER — Encounter: Payer: Self-pay | Admitting: Emergency Medicine

## 2018-07-19 ENCOUNTER — Other Ambulatory Visit: Payer: Self-pay | Admitting: Physician Assistant

## 2018-07-19 DIAGNOSIS — Z77018 Contact with and (suspected) exposure to other hazardous metals: Secondary | ICD-10-CM

## 2018-07-28 ENCOUNTER — Other Ambulatory Visit: Payer: Self-pay

## 2018-07-28 ENCOUNTER — Encounter: Payer: Self-pay | Admitting: Family Medicine

## 2018-07-28 ENCOUNTER — Ambulatory Visit (INDEPENDENT_AMBULATORY_CARE_PROVIDER_SITE_OTHER): Payer: Self-pay | Admitting: Family Medicine

## 2018-07-28 VITALS — BP 112/72 | HR 68 | Temp 98.0°F | Resp 16 | Wt 169.1 lb

## 2018-07-28 DIAGNOSIS — R079 Chest pain, unspecified: Secondary | ICD-10-CM

## 2018-07-28 LAB — TROPONIN I: TNIDX: 0 ug/l (ref 0.00–0.06)

## 2018-07-28 LAB — PROTIME-INR
INR: 2.6 ratio — ABNORMAL HIGH (ref 0.8–1.0)
Prothrombin Time: 29.4 s — ABNORMAL HIGH (ref 9.6–13.1)

## 2018-07-28 NOTE — Patient Instructions (Signed)
Follow up w/ Einar Pheasant as needed or as scheduled We'll notify you of your lab results and make any changes if needed We'll call you with your Cardiology appt for a complete workup I suspect your chest pain is NOT heart related but more musculoskeletal- take Tylenol (acetaminophen) for pain relief IF your chest pain changes or worsens, please go to the ER Call with any questions or concerns Hang in there!!!

## 2018-07-28 NOTE — Progress Notes (Signed)
   Subjective:    Patient ID: NIKI COSMAN, male    DOB: 1960-07-28, 58 y.o.   MRN: [id number]  HPI Chest pain- pt walked into office complaining of 8/10 chest pain.  He reports this has been ongoing x1 month and when he mentioned it to a friend, they suggested he go see his doctor.  He denies SOB, N/V, dizziness, diaphoresis.  No radiation of pain to neck/shoulder.  CP is reproducible w/ palpation- 'it feels like a bruise'.  Hx of clotting disorder but not compliant w/ coumadin checks.  Pt states that he has avoided coming b/c 'I don't want no hospital trip'.   Review of Systems For ROS see HPI     Objective:   Physical Exam AAOx3, NAD despite stating pain is 8/10, smells of cigarettes NCAT PERRL, EOMI Bradycardic but reg S1/S2 Lungs CTAB TTP over L upper rib and intercostal muscle in mid clavicular line      Assessment & Plan:  CP- new to provider, pt reports this has been going on x1 month.  Pain is reproducible on exam, EKG unchanged from 2018.  Likelihood of cardiac CP is low but he does have hx of clotting disorder w/ coumadin noncompliance.  Will get troponin and PT/INR.  Refer to Cardiology for complete workup as he has family hx of CAD, personal hx of tobacco abuse, and a clotting disorder.  Reviewed supportive care and red flags that should prompt return.  Pt expressed understanding and is in agreement w/ plan.

## 2018-08-09 ENCOUNTER — Ambulatory Visit
Admission: RE | Admit: 2018-08-09 | Discharge: 2018-08-09 | Disposition: A | Discharge status: Home / Self Care | Payer: No Typology Code available for payment source | Source: Ambulatory Visit | Attending: Physician Assistant | Admitting: Physician Assistant

## 2018-08-09 ENCOUNTER — Other Ambulatory Visit: Payer: Self-pay

## 2018-08-09 ENCOUNTER — Ambulatory Visit
Admission: RE | Admit: 2018-08-09 | Discharge: 2018-08-09 | Disposition: A | Discharge status: Home / Self Care | Payer: Self-pay | Source: Ambulatory Visit | Attending: Physician Assistant | Admitting: Physician Assistant

## 2018-08-09 DIAGNOSIS — Z77018 Contact with and (suspected) exposure to other hazardous metals: Secondary | ICD-10-CM

## 2018-08-09 DIAGNOSIS — G8929 Other chronic pain: Secondary | ICD-10-CM

## 2018-08-09 DIAGNOSIS — M5136 Other intervertebral disc degeneration, lumbar region: Secondary | ICD-10-CM

## 2018-08-09 DIAGNOSIS — M5442 Lumbago with sciatica, left side: Secondary | ICD-10-CM

## 2018-08-09 DIAGNOSIS — M4800 Spinal stenosis, site unspecified: Secondary | ICD-10-CM

## 2018-08-09 DIAGNOSIS — M5126 Other intervertebral disc displacement, lumbar region: Secondary | ICD-10-CM

## 2018-08-09 IMAGING — CR ORBITS FOR FOREIGN BODY - 2 VIEW
2 series · 2 of 2 positions shown · non-contrast
Comparison: None.

CLINICAL DATA: Metal working/exposure; clearance prior to MRI

EXAM:
ORBITS FOR FOREIGN BODY - 2 VIEW

[w orbit pa (1 of 2)]
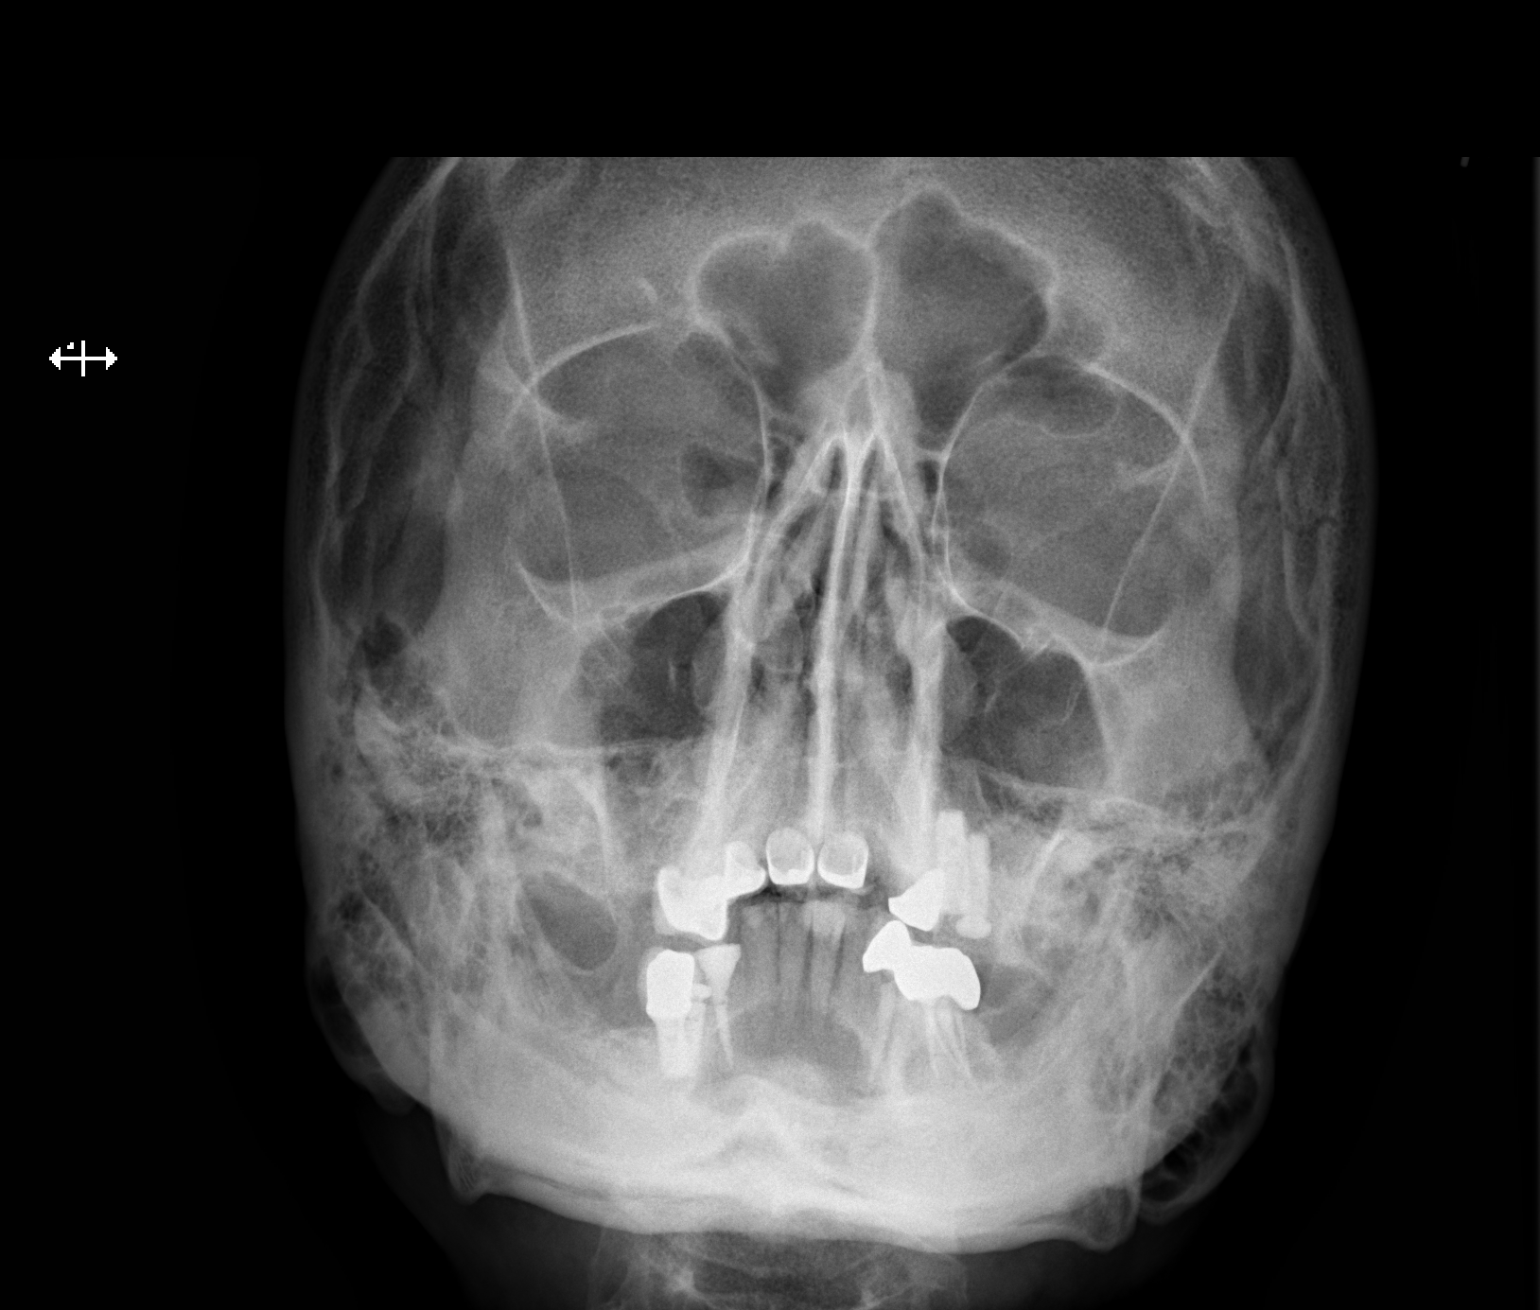

[w orbit pa (2 of 2)]
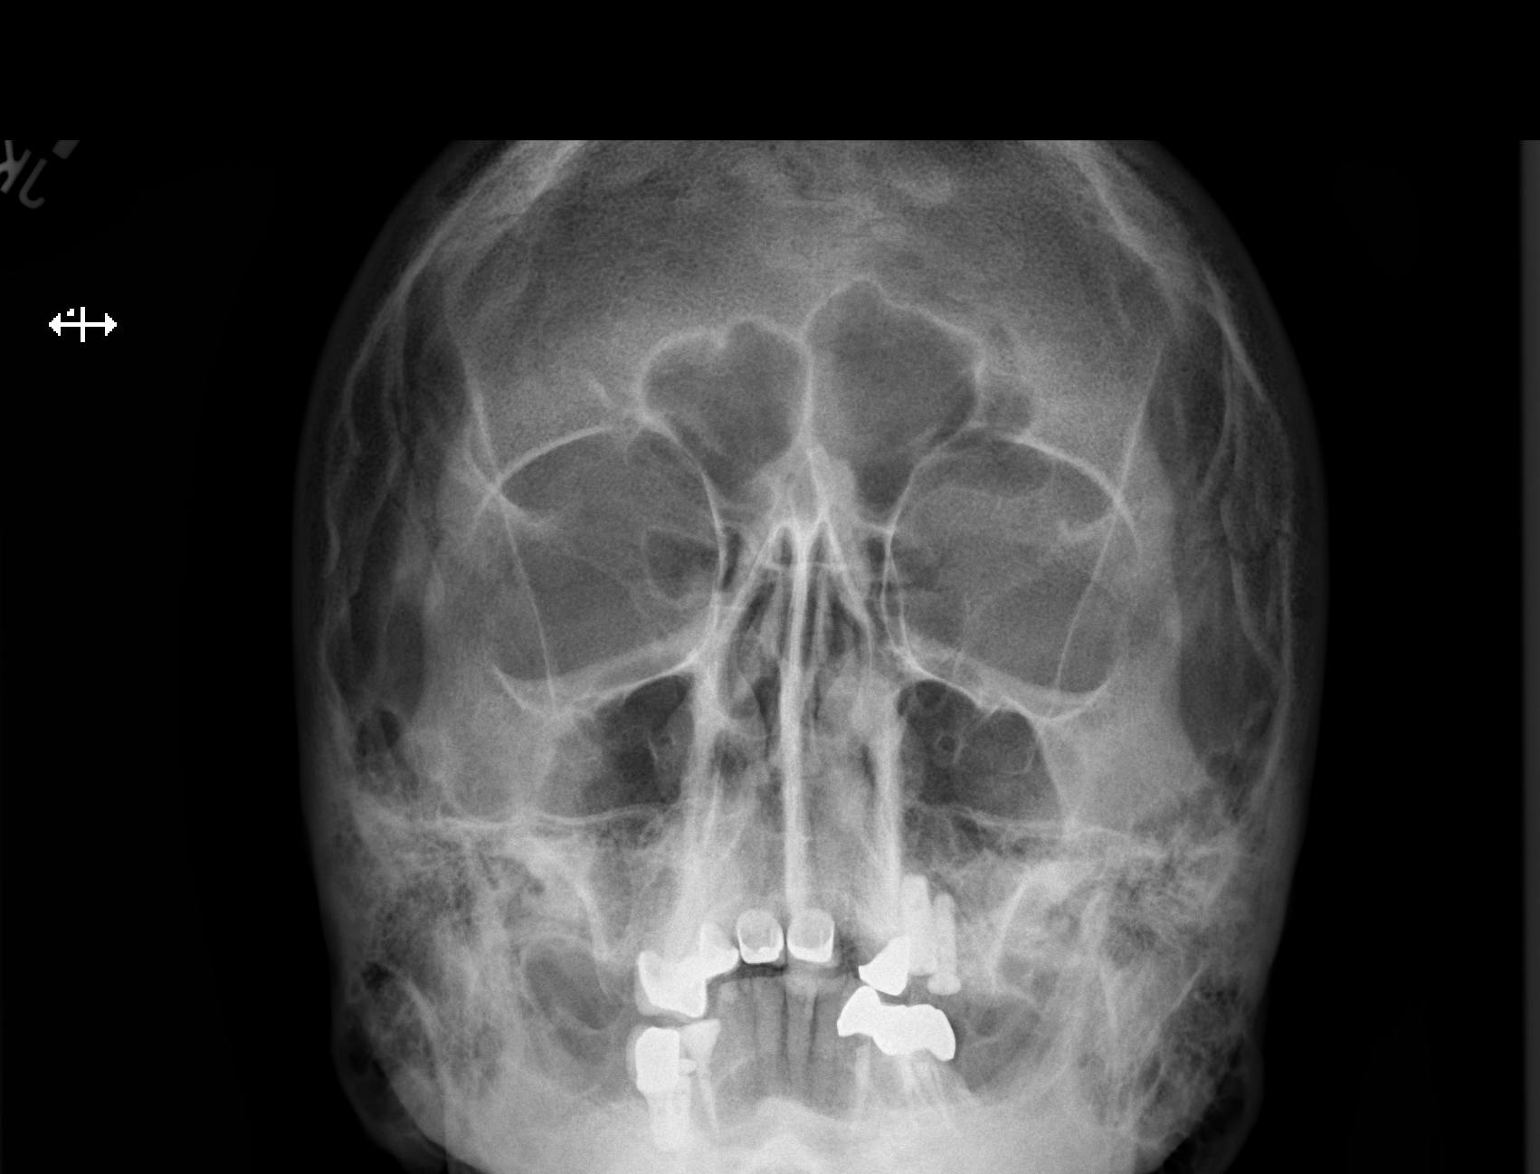

[2 of 2 positions shown; findings below may reference images not displayed]

FINDINGS: Water's views with eyes deviated toward the left and toward the
right were obtained. No intraorbital radiopaque foreign body.
Paranasal sinuses are clear. No fracture or dislocation.
IMPRESSION: No evidence of metallic foreign body within the orbits.

## 2018-08-09 IMAGING — MR MRI LUMBAR SPINE WITHOUT CONTRAST
5 series · 43 of 48 positions shown · non-contrast
Comparison: None.

CLINICAL DATA: Many months of extreme, diffuse back pain that is
worsening. NKI. No surgery. Pain is radiating into the left side of
his chest and upper back and is in low back and both lower
extremities.

EXAM:
MRI LUMBAR SPINE WITHOUT CONTRAST
TECHNIQUE: Multiplanar, multisequence MR imaging of the lumbar spine was
performed. No intravenous contrast was administered.

[Series 3: T2 · sagittal · 4.0mm · 0.88mm/px · 6 of 16 slices shown (1 of 2)]
[im 1/16]
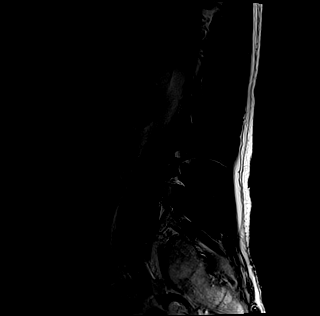
[im 4/16]
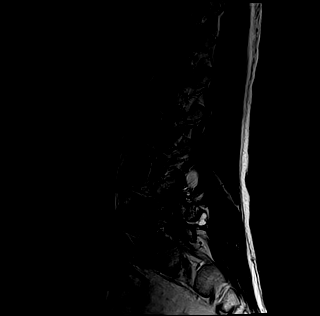
[im 7/16]
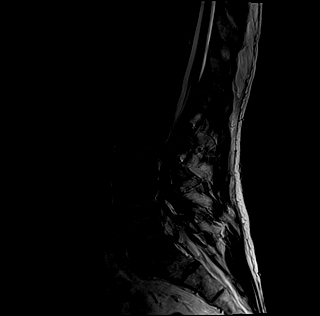
[im 10/16]
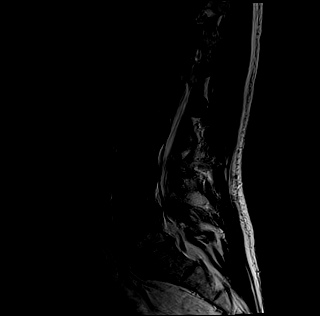
[im 13/16]
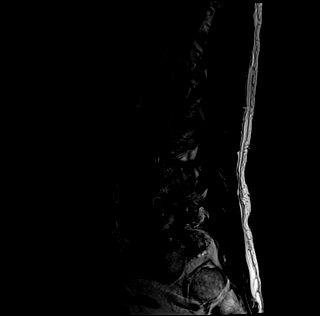
[im 16/16]
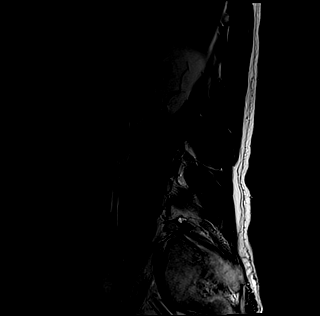

[Series 4: tirm sag · sagittal · 4.0mm · 0.55mm/px · 6 of 16 slices shown]
[im 1/16]
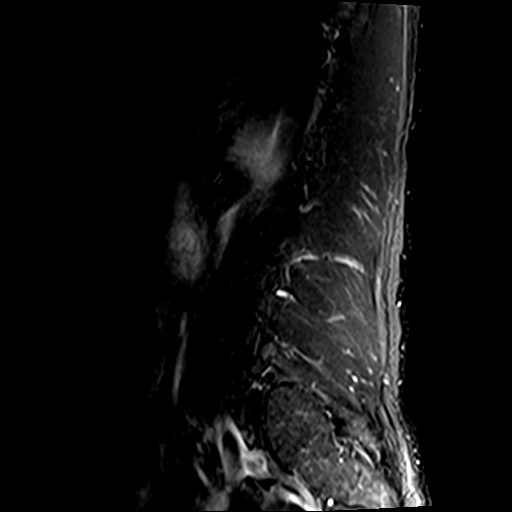
[im 4/16]
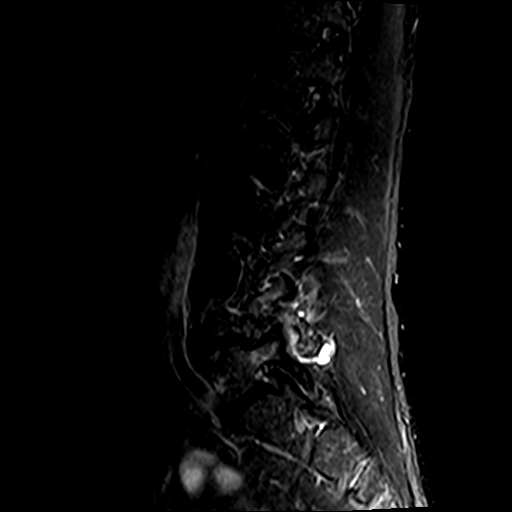
[im 7/16]
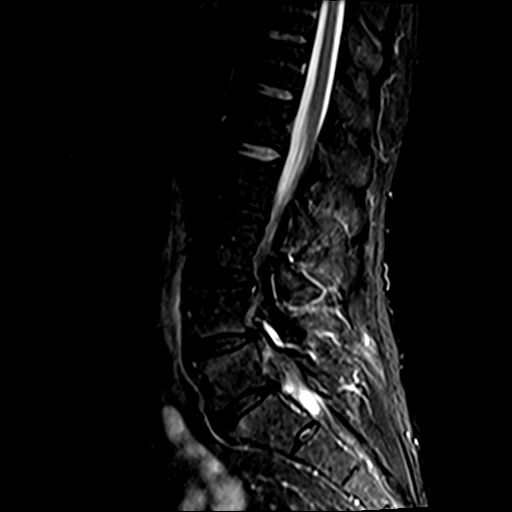
[im 10/16]
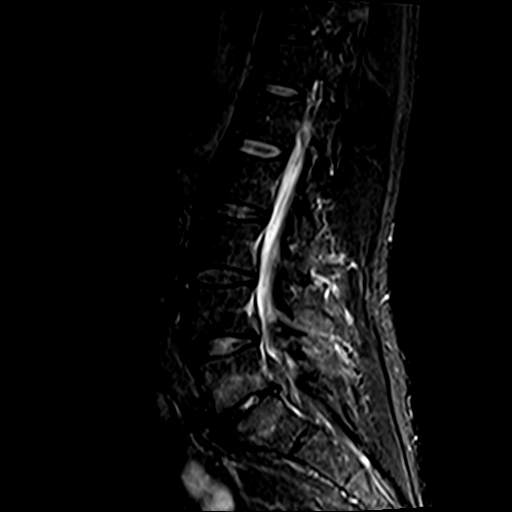
[im 13/16]
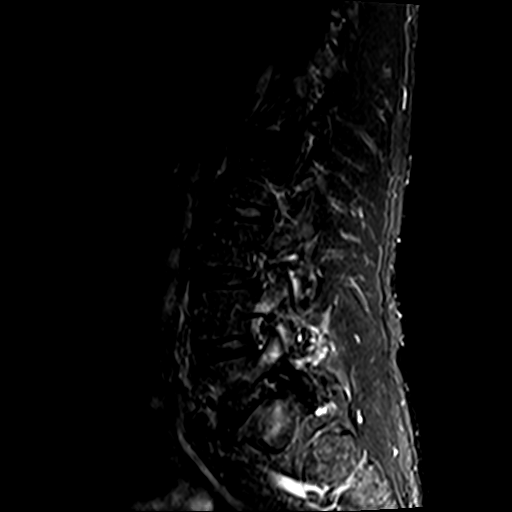
[im 16/16]
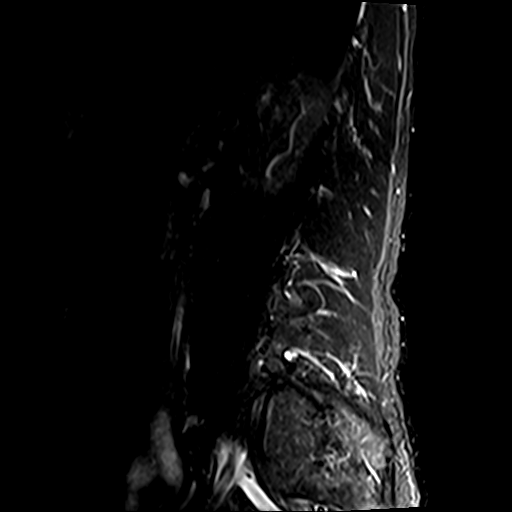

[Series 5: T1 · sagittal · 4.0mm · 0.88mm/px · 6 of 16 slices shown (1 of 2)]
[im 1/16]
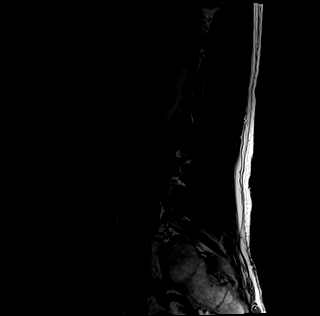
[im 4/16]
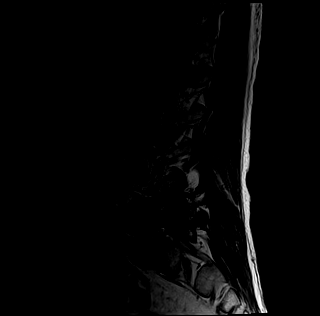
[im 7/16]
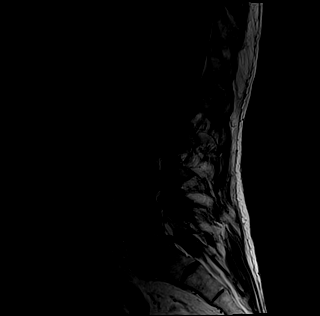
[im 10/16]
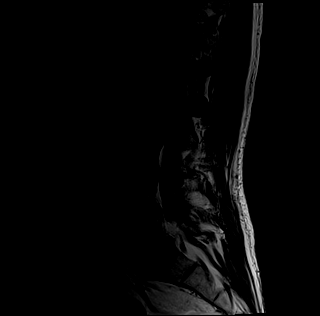
[im 13/16]
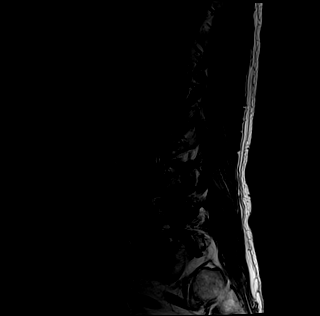
[im 16/16]
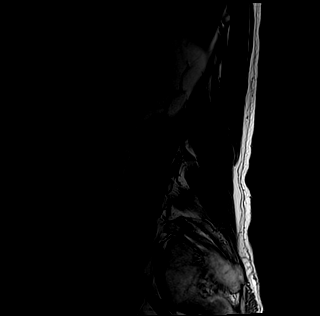

[Series 6: T1 · axial · 4.0mm · 0.70mm/px · z∈[-83,+134]mm · 10 of 41 slices shown (2 of 2)]
[im 1/41]
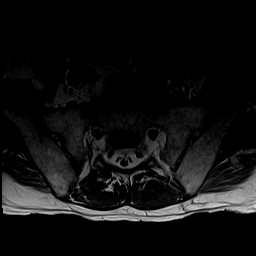
[im 3/41]
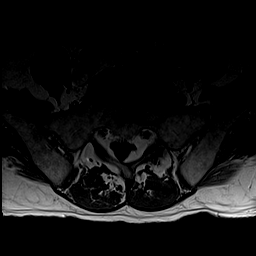
[im 6/41]
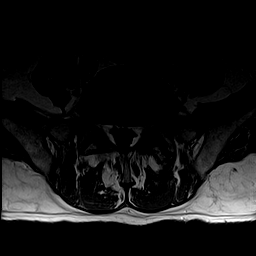
[im 12/41]
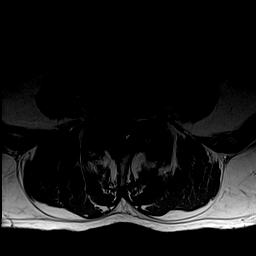
[im 18/41]
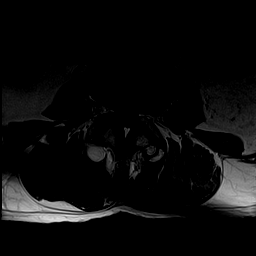
[im 21/41]
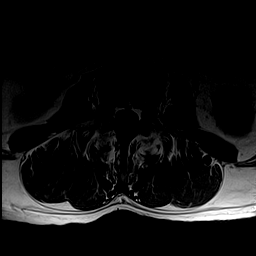
[im 23/41]
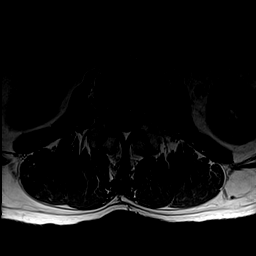
[im 29/41]
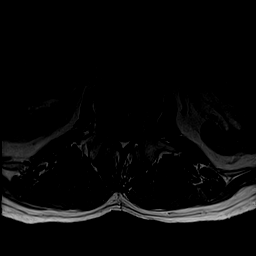
[im 35/41]
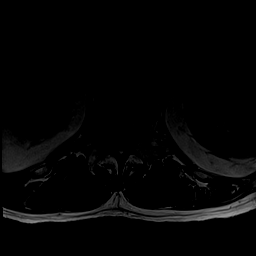
[im 41/41]
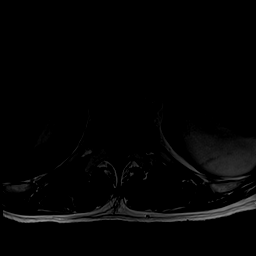

[Series 7: T2 · axial · 4.0mm · 0.70mm/px · z∈[-83,+134]mm · 15 of 41 slices shown (2 of 2)]
[im 1/41]
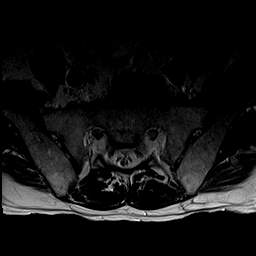
[im 3/41]
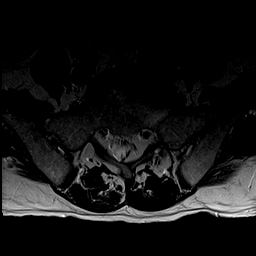
[im 6/41]
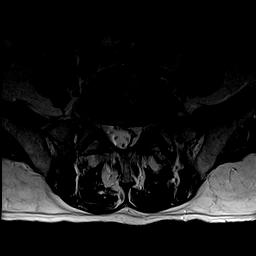
[im 9/41]
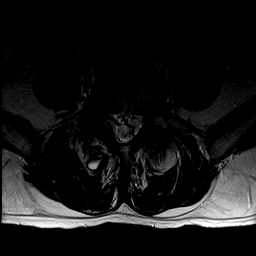
[im 12/41]
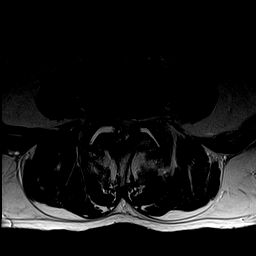
[im 15/41]
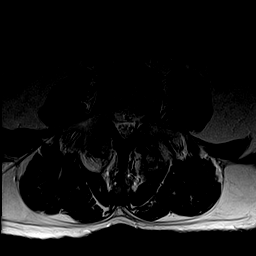
[im 18/41]
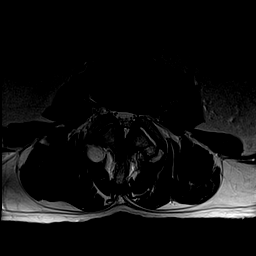
[im 21/41]
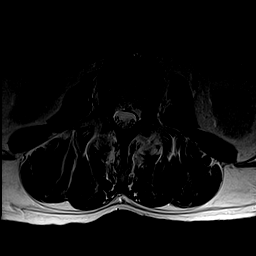
[im 23/41]
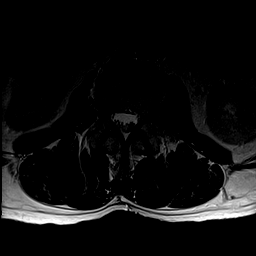
[im 26/41]
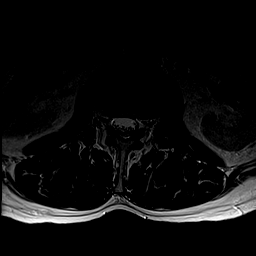
[im 29/41]
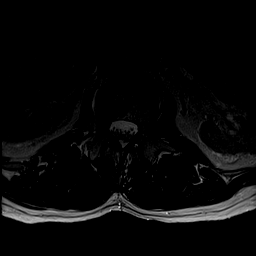
[im 32/41]
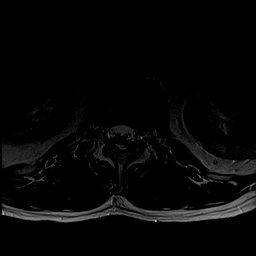
[im 35/41]
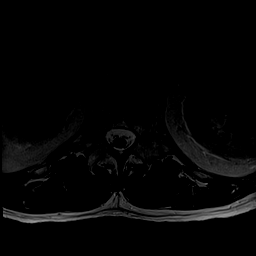
[im 38/41]
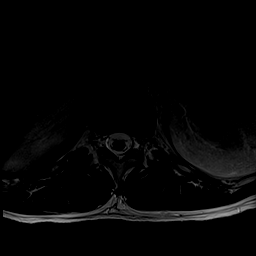
[im 41/41]
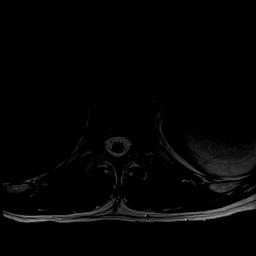

[43 of 48 positions shown; findings below may reference images not displayed]

FINDINGS: Segmentation:  Standard.

Alignment: Grade 1 anterolisthesis of L4 on L5 secondary to facet
disease.

Vertebrae:  No fracture, evidence of discitis, or bone lesion.

Conus medullaris and cauda equina: Conus extends to the L1-2 level.
Conus and cauda equina appear normal.

Paraspinal and other soft tissues: No acute paraspinal abnormality

Disc levels:

Disc spaces: General disc disease with disc height loss at L5-S1.
Disc desiccation at L2-3, L3-4 and L4-5.

T12-L1: No significant disc bulge. No evidence of neural foraminal
stenosis. No central canal stenosis.

L1-L2: No significant disc bulge. No evidence of neural foraminal
stenosis. No central canal stenosis.

L2-L3: Broad-based disc bulge. Mild bilateral facet arthropathy. No
evidence of neural foraminal stenosis. No central canal stenosis.

L3-L4: Broad-based disc bulge. Moderate right and severe left facet
arthropathy with a left facet effusion. Mild spinal stenosis. No
evidence of neural foraminal stenosis.

L4-L5: Broad-based disc bulge. Severe bilateral facet arthropathy
with bilateral facet effusions, right intraspinal 9 mm synovial cyst
and ligamentum flavum infolding. Severe spinal stenosis. Mild
bilateral foraminal stenosis.

L5-S1: Broad-based disc bulge with a central annular fissure.
Moderate left facet arthropathy. Mild right facet arthropathy.
Moderate left foraminal stenosis. Mild right foraminal stenosis.
IMPRESSION: 1. At L4-5 there is a broad-based disc bulge. Severe bilateral facet
arthropathy with bilateral facet effusions, right intraspinal 9 mm
synovial cyst and ligamentum flavum infolding. Severe spinal
stenosis. Mild bilateral foraminal stenosis.
2. Lumbar spine spondylosis as described above.

## 2018-08-10 ENCOUNTER — Telehealth: Payer: Self-pay | Admitting: Cardiology

## 2018-08-10 NOTE — Telephone Encounter (Signed)

## 2018-08-11 ENCOUNTER — Other Ambulatory Visit: Payer: Self-pay

## 2018-08-11 ENCOUNTER — Telehealth (INDEPENDENT_AMBULATORY_CARE_PROVIDER_SITE_OTHER): Payer: Self-pay | Admitting: Cardiology

## 2018-08-11 ENCOUNTER — Encounter: Payer: Self-pay | Admitting: Cardiology

## 2018-08-11 VITALS — Ht 72.0 in | Wt 169.0 lb

## 2018-08-11 DIAGNOSIS — R079 Chest pain, unspecified: Secondary | ICD-10-CM

## 2018-08-11 DIAGNOSIS — Z8249 Family history of ischemic heart disease and other diseases of the circulatory system: Secondary | ICD-10-CM

## 2018-08-11 DIAGNOSIS — Z720 Tobacco use: Secondary | ICD-10-CM

## 2018-08-11 DIAGNOSIS — R0789 Other chest pain: Secondary | ICD-10-CM

## 2018-08-11 DIAGNOSIS — R002 Palpitations: Secondary | ICD-10-CM

## 2018-08-11 NOTE — Patient Instructions (Addendum)
Medication Instructions:  Your physician recommends that you continue on your current medications as directed. Please refer to the Current Medication list given to you today.  If you need a refill on your cardiac medications before your next appointment, please call your pharmacy.   Lab work: None If you have labs (blood work) drawn today and your tests are completely normal, you will receive your results only by: Marland Kitchen MyChart Message (if you have MyChart) OR . A paper copy in the mail If you have any lab test that is abnormal or we need to change your treatment, we will call you to review the results.  Testing/Procedures:                              Call the office to schedule your testing: [phone] Your physician has requested that you have an echocardiogram. Echocardiography is a painless test that uses sound waves to create images of your heart. It provides your doctor with information about the size and shape of your heart and how well your heart's chambers and valves are working. This procedure takes approximately one hour. There are no restrictions for this procedure.  Your physician has requested that you have a lexiscan myoview. For further information please visit [email or web address]. Please follow instruction sheet, as given.  Your physician has recommended that you wear an event monitor. Event monitors are medical devices that record the heart's electrical activity. Doctors most often [place] these monitors to diagnose arrhythmias. Arrhythmias are problems with the speed or rhythm of the heartbeat. The monitor is a small, portable device. You can wear one while you do your normal daily activities. This is usually used to diagnose what is causing palpitations/syncope (passing out).  Cardiac Scoring: Calcium Scoring   Follow-Up: As needed, follow up pending results.   Any Other Special Instructions Will Be Listed Below (If Applicable). Stress Test Instructions:  You are scheduled for  a Myocardial Perfusion Imaging Study. Please arrive 15 minutes prior to your appointment time for registration and insurance purposes.  The test will take approximately 3 to 4 hours to complete; you may bring reading material.  If someone comes with you to your appointment, they will need to remain in the main lobby due to limited space in the testing area. **If you are pregnant or breastfeeding, please notify the nuclear lab prior to your appointment**  How to prepare for your Myocardial Perfusion Test: . Do not eat or drink 3 hours prior to your test, except you may have water. . Do not consume products containing caffeine (regular or decaffeinated) 12 hours prior to your test. (ex: coffee, chocolate, sodas, tea). . Do wear comfortable clothes (no dresses or overalls) and walking shoes, tennis shoes preferred (No heels or open toe shoes are allowed). . Do NOT wear cologne, perfume, aftershave, or lotions (deodorant is allowed). . If these instructions are not followed, your test will have to be rescheduled.  Please report to [address] for your test.  If you have questions or concerns about your appointment, you can call the Nuclear Lab at [phone].  If you cannot keep your appointment, please provide 24 hours notification to the Nuclear Lab, to avoid a possible $50 charge to your account.

## 2018-08-11 NOTE — Addendum Note (Signed)
Addended by: Sarina Ill on: 08/11/2018 04:34 PM   Modules accepted: Orders

## 2018-08-11 NOTE — Progress Notes (Signed)
Virtual Visit via Video Note   This visit type was conducted due to national recommendations for restrictions regarding the COVID-19 Pandemic (e.g. social distancing) in an effort to limit this patient's exposure and mitigate transmission in our community.  Due to his co-morbid illnesses, this patient is at least at moderate risk for complications without adequate follow up.  This format is felt to be most appropriate for this patient at this time.  All issues noted in this document were discussed and addressed.  A limited physical exam was performed with this format.  Please refer to the patient's chart for his consent to telehealth for [facility].   Evaluation Performed: Cardiology Consult  This visit type was conducted due to national recommendations for restrictions regarding the COVID-19 Pandemic (e.g. social distancing).  This format is felt to be most appropriate for this patient at this time.  All issues noted in this document were discussed and addressed.  No physical exam was performed (except for noted visual exam findings with Video Visits).  Please refer to the patient's chart (MyChart message for video visits and phone note for telephone visits) for the patient's consent to telehealth for [facility].  Date:  08/11/2018   ID:  Craig Hahn, DOB 1960-09-23, MRN [id number]  Patient Location:  Home  [place]   [place]  PCP:  Waldon MerlMartin, William C, PA-C  Cardiologist:  NEW Electrophysiologist:  None   Chief Complaint:  Chest pain  History of Present Illness:    Craig Hahn is a 58 y.o. male who presents via audio/video conferencing for a telehealth visit today in referral from Dr. Beverely Lowabori for evaluation of chest pain.   This is a 58yo male with a history of ? Clotting d/o in 2006, DVT and depression.  He was recently seen by his PCP for chest pain.  He apparently walked into the PCP office c/o 8/10 CP that had been going on for a month.  He denied any associated  SOB or nausea and no radiation of the discomfort. For the last few days he has broken out in a sweat in the am when he got up. He says that he has also been awakening with night sweats a week ago.  He has no appetite.  He woke up this am and both his arms were numb which resolved when he got up and moved around.  He told the PCP it felt like a bruise and sore to touch.  Due to a fm hx of CAD and personal hx of tobacco abuse as well as clotting disorder he is referred for further evaluation.  He had quit smoking but started back about 2 months ago as well as cigars.  EKG was normal.    The patient does not have symptoms concerning for COVID-19 infection (fever, chills, cough, or new shortness of breath).   Prior CV studies:   The following studies were reviewed today:  EKG and labs  Past Medical History:  Diagnosis Date  . Allergy   . Anxiety   . Clotting disorder (HCC)    Unsure if has ever been worked up, per pet has had 9 clots in the past, last was in 2006, both brothers also have clotting issues  . Depression   . DVT (deep venous thrombosis) (HCC)    Past Surgical History:  Procedure Laterality Date  . dental implants     x8     Current Meds  Medication Sig  . OVER THE COUNTER  MEDICATION Take 1 capsule by mouth as directed. Royal maca  . warfarin (COUMADIN) 2 MG tablet Take as directed by anticoagulation clinic.  Marland Kitchen warfarin (COUMADIN) 5 MG tablet Take as directed by anticoagulation clinic.     Allergies:   Codeine, Penicillins, and Sulfa antibiotics   Social History   Tobacco Use  . Smoking status: Current Some Day Smoker    Packs/day: 1.00    Types: E-cigarettes, Cigarettes    Last attempt to quit: 01/14/2017    Years since quitting: 1.5  . Smokeless tobacco: Never Used  Substance Use Topics  . Alcohol use: Yes    Comment: daily 4 beers   . Drug use: No     Family Hx: The patient's family history includes Clotting disorder in his brother and brother; Heart attack  in his father.  ROS:   Please see the history of present illness.     All other systems reviewed and are negative.   Labs/Other Tests and Data Reviewed:    Recent Labs: No results found for requested labs within last 8760 hours.   Recent Lipid Panel Lab Results  Component Value Date/Time   CHOL 180 01/21/2017 09:37 AM   TRIG 63.0 01/21/2017 09:37 AM   HDL 65.80 01/21/2017 09:37 AM   CHOLHDL 3 01/21/2017 09:37 AM   LDLCALC 102 (H) 01/21/2017 09:37 AM    Wt Readings from Last 3 Encounters:  08/11/18 169 lb (76.7 kg)  07/28/18 169 lb 2 oz (76.7 kg)  06/22/18 181 lb (82.1 kg)     Objective:    Vital Signs:  Ht 6' (1.829 m)   Wt 169 lb (76.7 kg)   BMI 22.92 kg/m    CONSTITUTIONAL:  Well nourished, well developed male in no acute distress.  EYES: anicteric MOUTH: oral mucosa is pink RESPIRATORY: Normal respiratory effort, symmetric expansion CARDIOVASCULAR: No peripheral edema SKIN: No rash, lesions or ulcers MUSCULOSKELETAL: no digital cyanosis NEURO: Cranial Nerves II-XII grossly intact, moves all extremities PSYCH: Intact judgement and insight.  A&O x 3, Mood/affect appropriate   ASSESSMENT & PLAN:    1.  Chest pain -  This is atypical in that it is nonexertional with no associated sx and reproducible with palpation.  He does have CRF including ongoing tobacco use and fm hx of CAD.  EKG is nonischemic.  I do not think the pain he is having is cardiac related but due to risk factors including ongoing tobacco use and fm hx of CAD.  I have recommended a Lexiscan myoview to rule out ischemia. He is self pay and has been out of work due to [employer] and says he cannot afford any studies.  I will hold off on the echo and find out how much the stress test will be.  2.  Palpitations - he describes a sensation of a buzzing bee under his shirt.  This has been going on for months and is different than what the recent CP is.  He is not sure if it is really palpitations.  He says that  he cannot afford an event monitor at this time.   3.  Clotting disorder with hx of DVT - the last time he had a problem was in 2006.    COVID-19 Education: The signs and symptoms of COVID-19 were discussed with the patient and how to seek care for testing (follow up with PCP or arrange E-visit).  The importance of social distancing was discussed today.  Patient Risk:   After full review  of this patient's clinical status, I feel that they are at least moderate risk at this time.  Time:   Today, I have spent 25 minutes on telemedicine discussing medical problems including chest pin.  We also reviewed the symptoms of COVID 19 and the ways to protect against contracting the virus with telehealth technology.  I spent an additional 5 minutes reviewing patient's chart including office notes, EKG and labs.  Medication Adjustments/Labs and Tests Ordered: Current medicines are reviewed at length with the patient today.  Concerns regarding medicines are outlined above.  Tests Ordered: No orders of the defined types were placed in this encounter.  Medication Changes: No orders of the defined types were placed in this encounter.   Disposition:  Follow up prn  Signed, Armanda Magicraci Makalah Asberry, MD  08/11/2018 2:33 PM    [facility]

## 2018-08-17 ENCOUNTER — Telehealth: Payer: Self-pay | Admitting: *Deleted

## 2018-08-17 NOTE — Telephone Encounter (Signed)
Patient declined 30 day cardiac event monitor due to financial reasons.  Patient does not have health insurance and cannot afford the test.  Patient asked to cancel 08/19/18 echocardiogram appointment for the same reason.

## 2018-08-19 ENCOUNTER — Other Ambulatory Visit (HOSPITAL_COMMUNITY): Payer: No Typology Code available for payment source

## 2018-11-24 ENCOUNTER — Other Ambulatory Visit: Payer: Self-pay | Admitting: Physician Assistant

## 2018-11-24 DIAGNOSIS — D689 Coagulation defect, unspecified: Secondary | ICD-10-CM

## 2018-11-28 ENCOUNTER — Other Ambulatory Visit: Payer: Self-pay | Admitting: Physician Assistant

## 2018-11-28 DIAGNOSIS — D689 Coagulation defect, unspecified: Secondary | ICD-10-CM

## 2018-12-01 ENCOUNTER — Ambulatory Visit: Payer: No Typology Code available for payment source

## 2018-12-02 ENCOUNTER — Other Ambulatory Visit: Payer: Self-pay

## 2018-12-02 ENCOUNTER — Ambulatory Visit (INDEPENDENT_AMBULATORY_CARE_PROVIDER_SITE_OTHER): Payer: Self-pay

## 2018-12-02 DIAGNOSIS — D689 Coagulation defect, unspecified: Secondary | ICD-10-CM

## 2018-12-02 DIAGNOSIS — I824Z9 Acute embolism and thrombosis of unspecified deep veins of unspecified distal lower extremity: Secondary | ICD-10-CM

## 2018-12-02 LAB — PROTIME-INR
INR: 1.4 ratio — ABNORMAL HIGH (ref 0.8–1.0)
Prothrombin Time: 16.1 s — ABNORMAL HIGH (ref 9.6–13.1)

## 2018-12-05 ENCOUNTER — Other Ambulatory Visit: Payer: Self-pay | Admitting: Emergency Medicine

## 2018-12-05 DIAGNOSIS — D689 Coagulation defect, unspecified: Secondary | ICD-10-CM

## 2018-12-05 MED ORDER — WARFARIN SODIUM 2 MG PO TABS: ORAL_TABLET | ORAL | 0 refills | Status: DC

## 2018-12-05 MED ORDER — WARFARIN SODIUM 5 MG PO TABS: ORAL_TABLET | ORAL | 0 refills | Status: DC

## 2019-01-04 ENCOUNTER — Ambulatory Visit (INDEPENDENT_AMBULATORY_CARE_PROVIDER_SITE_OTHER): Payer: Self-pay

## 2019-01-04 DIAGNOSIS — D689 Coagulation defect, unspecified: Secondary | ICD-10-CM

## 2019-01-05 ENCOUNTER — Telehealth: Payer: Self-pay | Admitting: Physician Assistant

## 2019-01-05 ENCOUNTER — Other Ambulatory Visit: Payer: Self-pay

## 2019-01-05 LAB — PROTIME-INR
INR: 1.5 — ABNORMAL HIGH
Prothrombin Time: 15.6 s — ABNORMAL HIGH (ref 9.0–11.5)

## 2019-01-05 MED ORDER — WARFARIN SODIUM 5 MG PO TABS: 5.0000 mg | ORAL_TABLET | Freq: Every day | ORAL | 0 refills | Status: DC

## 2019-01-05 MED ORDER — WARFARIN SODIUM 10 MG PO TABS: 10.0000 mg | ORAL_TABLET | Freq: Every day | ORAL | 0 refills | Status: DC

## 2019-01-05 MED ORDER — WARFARIN SODIUM 2 MG PO TABS: 2.0000 mg | ORAL_TABLET | Freq: Every day | ORAL | 0 refills | Status: DC

## 2019-01-05 NOTE — Telephone Encounter (Signed)
[place] to speak to pharmacist to verify that Warfarin is 10 mg on M and F and 7 mg the other days. There were two directions in patient medication

## 2019-01-05 NOTE — Telephone Encounter (Signed)
[facility] calling to verify Warfarin script please call back

## 2019-02-02 ENCOUNTER — Other Ambulatory Visit: Payer: Self-pay | Admitting: *Deleted

## 2019-02-02 ENCOUNTER — Telehealth: Payer: Self-pay | Admitting: *Deleted

## 2019-02-02 MED ORDER — WARFARIN SODIUM 5 MG PO TABS: 5.0000 mg | ORAL_TABLET | Freq: Every day | ORAL | 0 refills | Status: DC

## 2019-02-02 MED ORDER — WARFARIN SODIUM 2 MG PO TABS: 2.0000 mg | ORAL_TABLET | Freq: Every day | ORAL | 0 refills | Status: DC

## 2019-02-02 NOTE — Telephone Encounter (Signed)
Pt requesting Rx warfarin medication refill stated run out of them yesterday

## 2019-02-02 NOTE — Telephone Encounter (Signed)
Per PCP 5 Day quantity send to pharmacy

## 2019-02-02 NOTE — Telephone Encounter (Signed)
Pt requesting Rx warfarin refilled

## 2019-02-08 ENCOUNTER — Encounter: Payer: Self-pay | Admitting: Physician Assistant

## 2019-02-08 ENCOUNTER — Ambulatory Visit (INDEPENDENT_AMBULATORY_CARE_PROVIDER_SITE_OTHER): Payer: Self-pay

## 2019-02-08 ENCOUNTER — Other Ambulatory Visit: Payer: Self-pay

## 2019-02-08 ENCOUNTER — Telehealth: Payer: Self-pay

## 2019-02-08 DIAGNOSIS — D689 Coagulation defect, unspecified: Secondary | ICD-10-CM

## 2019-02-08 LAB — PROTIME-INR
INR: 1.1 ratio — ABNORMAL HIGH (ref 0.8–1.0)
Prothrombin Time: 12.6 s (ref 9.6–13.1)

## 2019-02-08 NOTE — Telephone Encounter (Signed)
Spoke with patient, states he would rather have appt with PCP to start with to address issues. Has been scheduled for telephone call today at 4 PM.

## 2019-02-08 NOTE — Progress Notes (Signed)
I have discussed the procedure for the virtual visit with the patient who has given consent to proceed with assessment and treatment.   Judd Mccubbin S Jerrell Hart, CMA     

## 2019-02-08 NOTE — Telephone Encounter (Signed)
It is very hard for me to help him if he will not schedule appointment to talk to me or follow any of my previous recommendations. He is noncompliant with INR checks which has caused issue in the past with making sure he is at a therapeutic range to prevent further clotting. He has been assessed for chest pain before by Dr. Beverely Low and his Cardiologist -- Cardiology wanted to proceed with stress testing, echo etc and he did not have these performed. He needs assessment with me so that we can determine what is going on -- a pulled muscle is very different than a blood clot. Really recommend he contact cardiology to see about rescheduling procedures recommended by Dr. Mayford Knife. What are current symptoms -- if he truly feels he has a clot he needs to either be seen at ER or if he refuses needs to at least see me so I can assess and we can proceed with imaging. We need documentation and diagnoses for insurance to pay for him to have a CTA for a clot.   Again very hard to care for him when he does not follow recommendations of the providers he is seeing.

## 2019-02-08 NOTE — Telephone Encounter (Signed)
Patient seen today for INR check. During his visit, he states that he is still having ongoing chest pain that has been relayed to PCP during previous INR check visits. Patient has continuously denied to follow PCP recommendations. States that he believes it is a clot or that he has pulled a muscle in his chest. Did not want me to discuss with PCP, but wanted to know what I suggested. Informed that I had to tell Selena Batten what was going on and told patient that PCP has recommended ER evaluation in the past. States he does not want to go into the hospital due to the increase of COVID cases. Informed patient that I would route message to PCP. Did stated that if PCP wanted any imaging performed, he wants to be seen at [facility].

## 2019-02-09 ENCOUNTER — Other Ambulatory Visit: Payer: Self-pay | Admitting: Emergency Medicine

## 2019-02-09 DIAGNOSIS — D689 Coagulation defect, unspecified: Secondary | ICD-10-CM

## 2019-02-09 MED ORDER — WARFARIN SODIUM 5 MG PO TABS: ORAL_TABLET | ORAL | 0 refills | Status: DC

## 2019-02-09 MED ORDER — WARFARIN SODIUM 2 MG PO TABS: ORAL_TABLET | ORAL | 0 refills | Status: DC

## 2019-02-09 NOTE — Progress Notes (Signed)
Patient no-showed for vide appt. LMOVM for callback

## 2020-01-16 ENCOUNTER — Telehealth: Payer: Self-pay

## 2020-01-16 NOTE — Telephone Encounter (Signed)
Nurse Assessment Nurse: Craig Mt RN, Nicholaus Bloom Date/Time (Eastern Time): 01/16/2020 1:13:41 PM Confirm and document reason for call. If symptomatic, describe symptoms. ---Caller states he is has been having chest pains around his heart after having an injury to his left chest that has been going on for a couple of weeks. Weakness in left arm. Pain is shooting into his back. Does the patient have any new or worsening symptoms? ---Yes Will a triage be completed? ---Yes Related visit to physician within the last 2 weeks? ---No Does the PT have any chronic conditions? (i.e. diabetes, asthma, this includes High risk factors for pregnancy, etc.) ---No Is this a behavioral health or substance abuse call? ---No Guidelines Guideline Title Affirmed Question Affirmed Notes Nurse Date/Time (Eastern Time) Chest Pain [1] Chest pain (or "angina") comes and goes AND [2] is happening more often (increasing in frequency) or getting worse (increasing in severity) (Exception: chest pains that last only a few seconds) [place], RN, Nicholaus Bloom 01/16/2020 1:15:39 PM PLEASE NOTE: All timestamps contained within this report are represented as [place] Standard Time. CONFIDENTIALTY NOTICE: This fax transmission is intended only for the addressee. It contains information that is legally privileged, confidential or otherwise protected from use or disclosure. If you are not the intended recipient, you are strictly prohibited from reviewing, disclosing, copying using or disseminating any of this information or taking any action in reliance on or regarding this information. If you have received this fax in error, please notify us immediately by telephone so that we can arrange for its return to [place]. Phone: [phone], Toll-Free: [phone], Fax: [phone] Page: 2 of 2 Call Id: [id number] Disp. Time Lamount Cohen Time) Disposition Final User 01/16/2020 1:12:16 PM Send to Urgent Franchot Gallo 01/16/2020 1:17:05 PM Go  to ED Now Yes Craig Mt, RN, Prentiss Bells Disagree/Comply Comply Caller Understands Yes PreDisposition Did not know what to do Care Advice Given Per Guideline GO TO ED NOW: * You need to be seen in the Emergency Department. * Go to the ED at ___________ Hospital. * Leave now. Drive carefully. ANOTHER ADULT SHOULD DRIVE: * It is better and safer if another adult drives instead of you. NOTHING BY MOUTH: * Do not eat or drink anything for now. CARE ADVICE given per Chest Pain (Adult) guideline. CALL EMS IF: * Severe difficulty breathing occurs * Passes out or becomes too weak to stand * You become worse Referrals [facility]

## 2020-03-12 ENCOUNTER — Encounter (HOSPITAL_COMMUNITY): Payer: Self-pay | Admitting: *Deleted

## 2020-03-12 ENCOUNTER — Other Ambulatory Visit: Payer: Self-pay

## 2020-03-12 ENCOUNTER — Emergency Department (HOSPITAL_COMMUNITY): Payer: Self-pay

## 2020-03-12 ENCOUNTER — Emergency Department (HOSPITAL_COMMUNITY)
Admission: EM | Admit: 2020-03-12 | Discharge: 2020-03-12 | Disposition: A | Discharge status: Home / Self Care | Payer: Self-pay | Attending: Emergency Medicine | Admitting: Emergency Medicine

## 2020-03-12 DIAGNOSIS — Z7901 Long term (current) use of anticoagulants: Secondary | ICD-10-CM | POA: Insufficient documentation

## 2020-03-12 DIAGNOSIS — R202 Paresthesia of skin: Secondary | ICD-10-CM | POA: Insufficient documentation

## 2020-03-12 DIAGNOSIS — R0789 Other chest pain: Secondary | ICD-10-CM | POA: Insufficient documentation

## 2020-03-12 DIAGNOSIS — F17290 Nicotine dependence, other tobacco product, uncomplicated: Secondary | ICD-10-CM | POA: Insufficient documentation

## 2020-03-12 DIAGNOSIS — M79602 Pain in left arm: Secondary | ICD-10-CM | POA: Insufficient documentation

## 2020-03-12 LAB — TROPONIN I (HIGH SENSITIVITY): Troponin I (High Sensitivity): 3 ng/L (ref <18)

## 2020-03-12 LAB — CBC
HCT: 47.2 % (ref 39.0–52.0)
Hemoglobin: 15.8 g/dL (ref 13.0–17.0)
MCH: 33.5 pg (ref 26.0–34.0)
MCHC: 33.5 g/dL (ref 30.0–36.0)
MCV: 100.2 fL — ABNORMAL HIGH (ref 80.0–100.0)
Platelets: 204 10*3/uL (ref 150–400)
RBC: 4.71 MIL/uL (ref 4.22–5.81)
RDW: 13.4 % (ref 11.5–15.5)
WBC: 7.3 10*3/uL (ref 4.0–10.5)
nRBC: 0 % (ref 0.0–0.2)

## 2020-03-12 LAB — BASIC METABOLIC PANEL
Anion gap: 6 (ref 5–15)
BUN: 18 mg/dL (ref 6–20)
CO2: 28 mmol/L (ref 22–32)
Calcium: 9.2 mg/dL (ref 8.9–10.3)
Chloride: 103 mmol/L (ref 98–111)
Creatinine, Ser: 0.95 mg/dL (ref 0.61–1.24)
GFR, Estimated: >60 mL/min (ref >60)
Glucose, Bld: 81 mg/dL (ref 70–99)
Potassium: 5 mmol/L (ref 3.5–5.1)
Sodium: 137 mmol/L (ref 135–145)

## 2020-03-12 LAB — ETHANOL: Alcohol, Ethyl (B): <10 mg/dL (ref <10)

## 2020-03-12 LAB — D-DIMER, QUANTITATIVE: D-Dimer, Quant: 1.03 ug/mL-FEU — ABNORMAL HIGH (ref 0.00–0.50)

## 2020-03-12 IMAGING — CT CT ANGIO CHEST
2 of 6 series · 18 of 46 positions shown · IV contrast (Omnipaque or Isovue)
Comparison: Chest CT dated [DATE] and radiograph dated
[DATE].

CLINICAL DATA: 59-year-old male with concern for pulmonary
embolism.

EXAM:
CT ANGIOGRAPHY CHEST WITH CONTRAST
TECHNIQUE: Multidetector CT imaging of the chest was performed using the
standard protocol during bolus administration of intravenous
contrast. Multiplanar CT image reconstructions and MIPs were
obtained to evaluate the vascular anatomy.
CONTRAST:  100mL OMNIPAQUE IOHEXOL 350 MG/ML SOLN

[Series 5: pe axial thins · axial · 0.61mm/px · z∈[+1329,+1564]mm · 15 of 259 slices shown]
[im 12/259  lung]
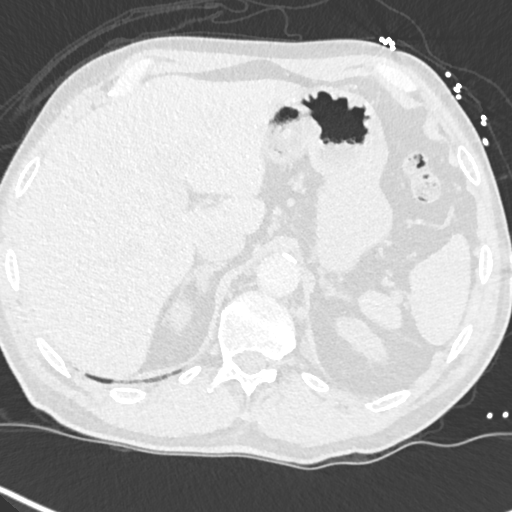
[im 34/259  soft-tissue]
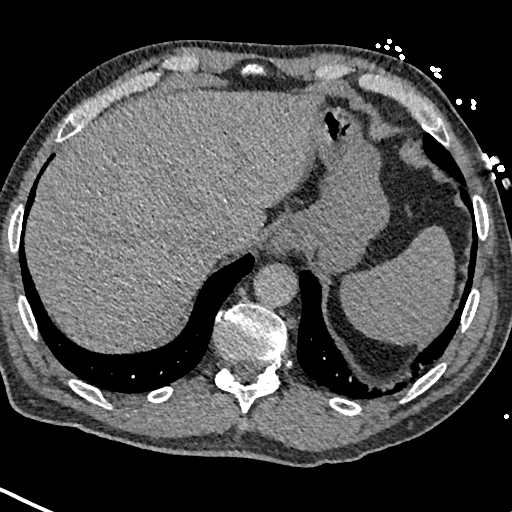
[im 45/259  lung]
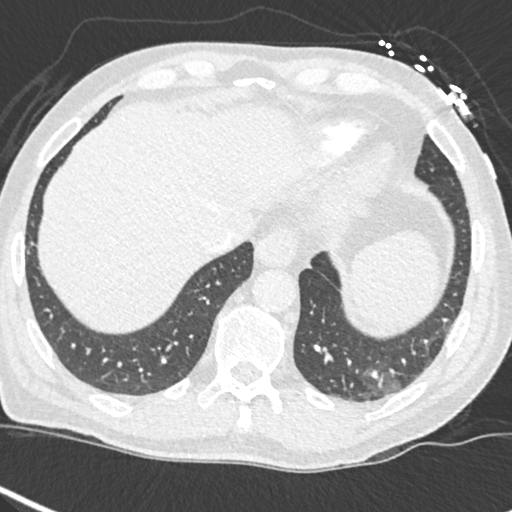
[im 68/259  soft-tissue]
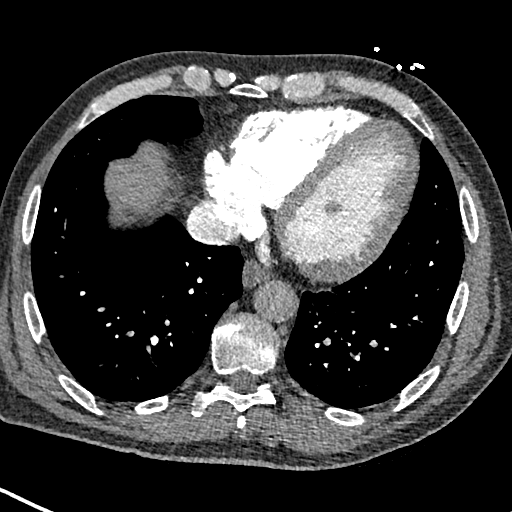
[im 79/259  lung]
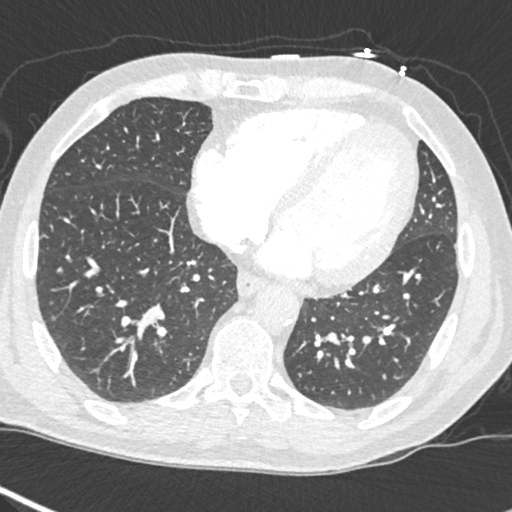
[im 101/259  soft-tissue]
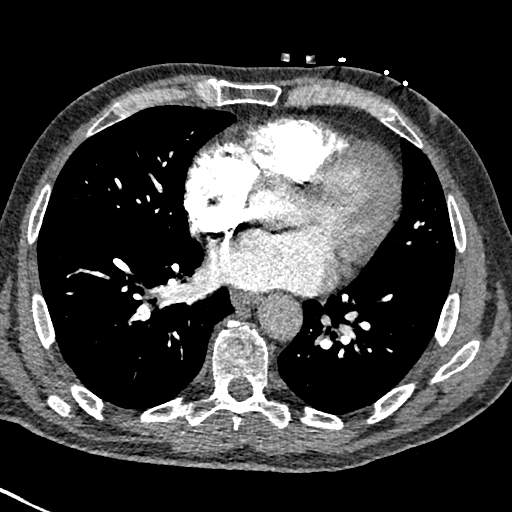
[im 113/259  lung]
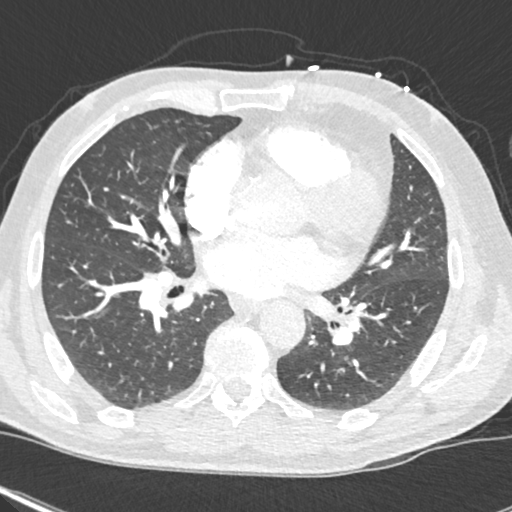
[im 135/259  soft-tissue]
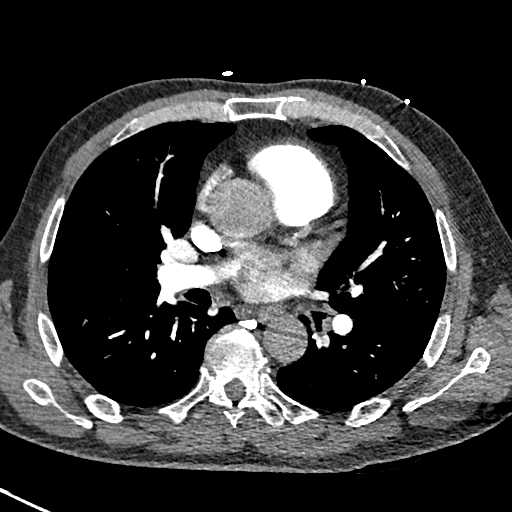
[im 146/259  lung]
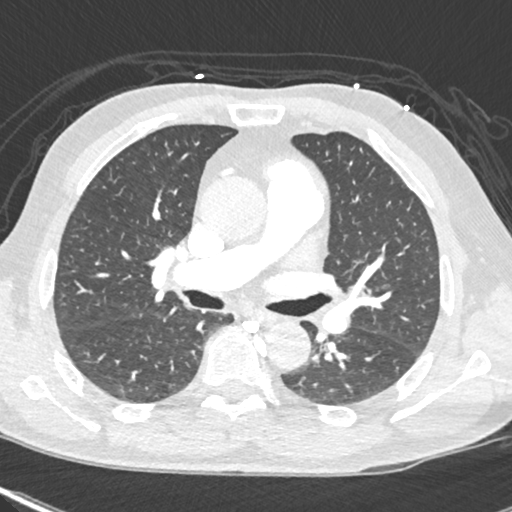
[im 158/259  soft-tissue]
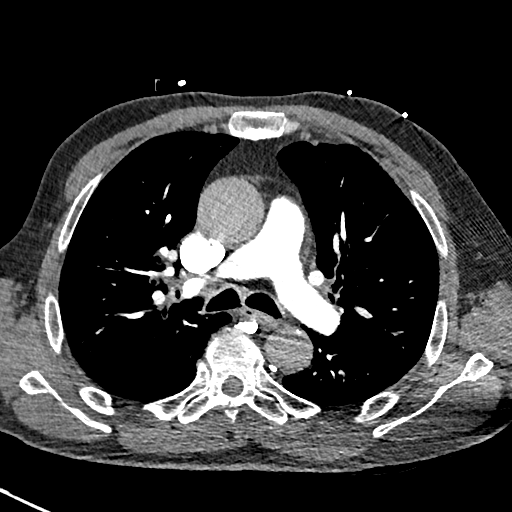
[im 180/259  lung]
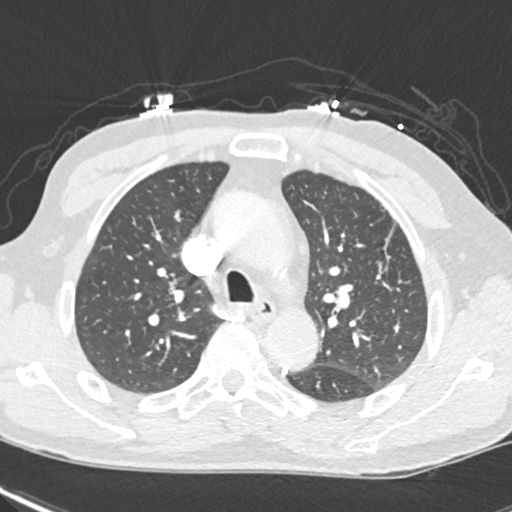
[im 191/259  soft-tissue]
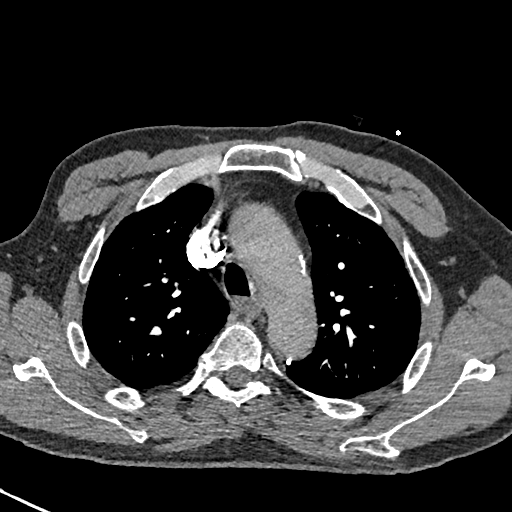
[im 214/259  lung]
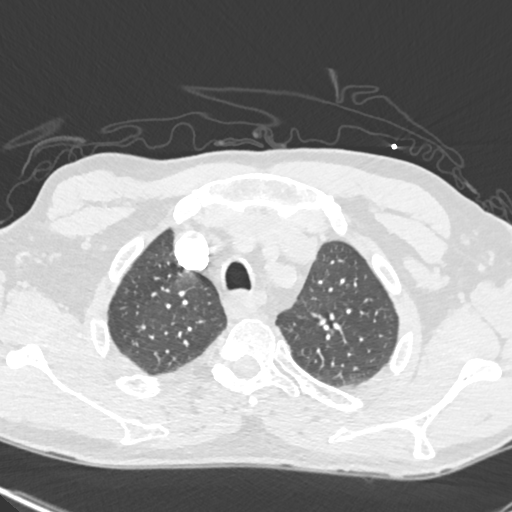
[im 225/259  soft-tissue]
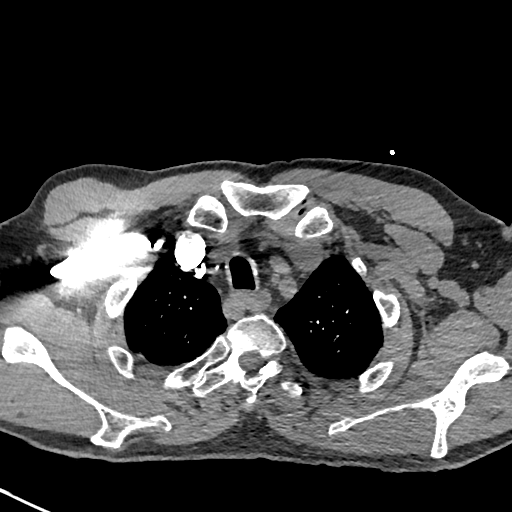
[im 247/259  lung]
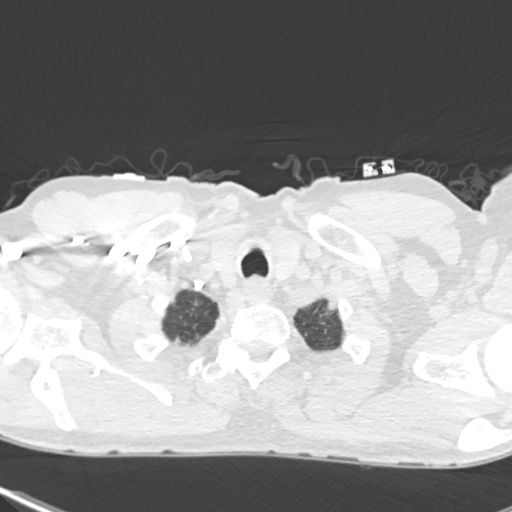

[Series 8: cor soft · coronal · 0.51mm/px · 3 of 116 slices shown]
[im 29/116  soft-tissue]
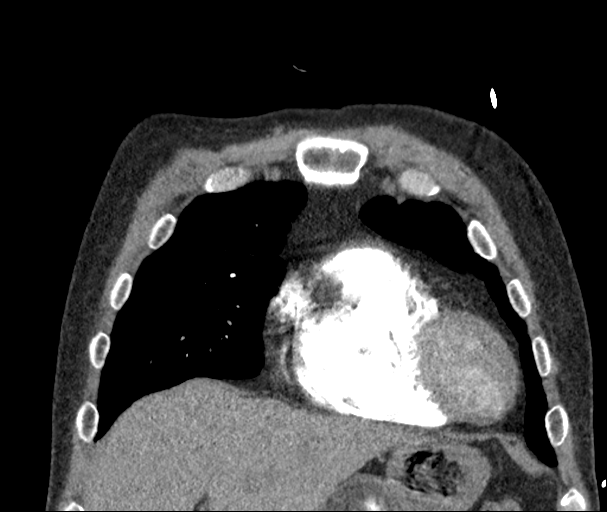
[im 58/116  soft-tissue]
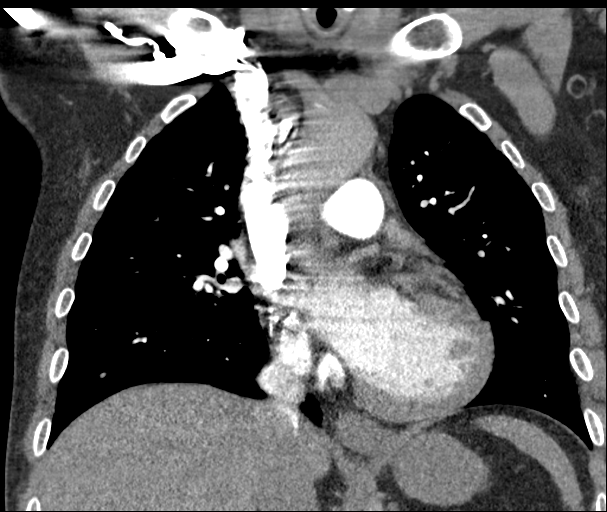
[im 87/116  soft-tissue]
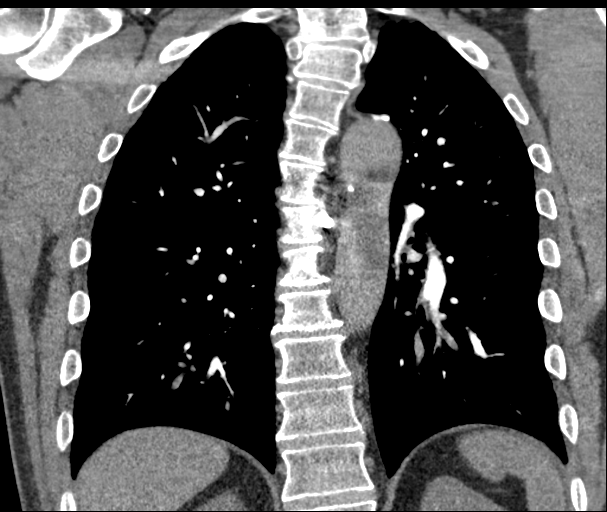

[18 of 46 positions shown; findings below may reference images not displayed]

FINDINGS: Cardiovascular: There is mild cardiomegaly. No pericardial effusion.
Mild atherosclerotic calcification of the thoracic aorta. Evaluation
of the aorta is limited due to suboptimal opacification and timing
of the contrast. Nonocclusive linear filling defect in the right
lower lobe pulmonary artery branch (157/5) was seen on the prior CT,
and likely sequela of prior embolus or scarring. No CT evidence of
acute pulmonary artery embolus.

Mediastinum/Nodes: No hilar or mediastinal adenopathy. The esophagus
and thyroid gland are grossly unremarkable. No mediastinal fluid
collection.

Lungs/Pleura: Linear left upper lobe scarring. No focal
consolidation, pleural effusion, or pneumothorax. The central
airways are patent.

Upper Abdomen: No acute abnormality.

Musculoskeletal: No chest wall abnormality. No acute or significant
osseous findings.

Review of the MIP images confirms the above findings.
IMPRESSION: 1. No acute intrathoracic pathology. Nonocclusive linear filling
defect in the right lower lobe pulmonary artery branch was seen on
the prior CT, and likely sequela of prior embolus or scarring. No CT
evidence of acute pulmonary artery embolus.
2. Mild cardiomegaly.
3. Aortic Atherosclerosis ([DG]-[DG]).

## 2020-03-12 IMAGING — DX DG CHEST 2V
2 series · 2 of 2 positions shown · non-contrast
Comparison: [DATE]

CLINICAL DATA: Chest pain for 2 months, weakness, dizziness,
tobacco abuse

EXAM:
CHEST - 2 VIEW

[chest pa]
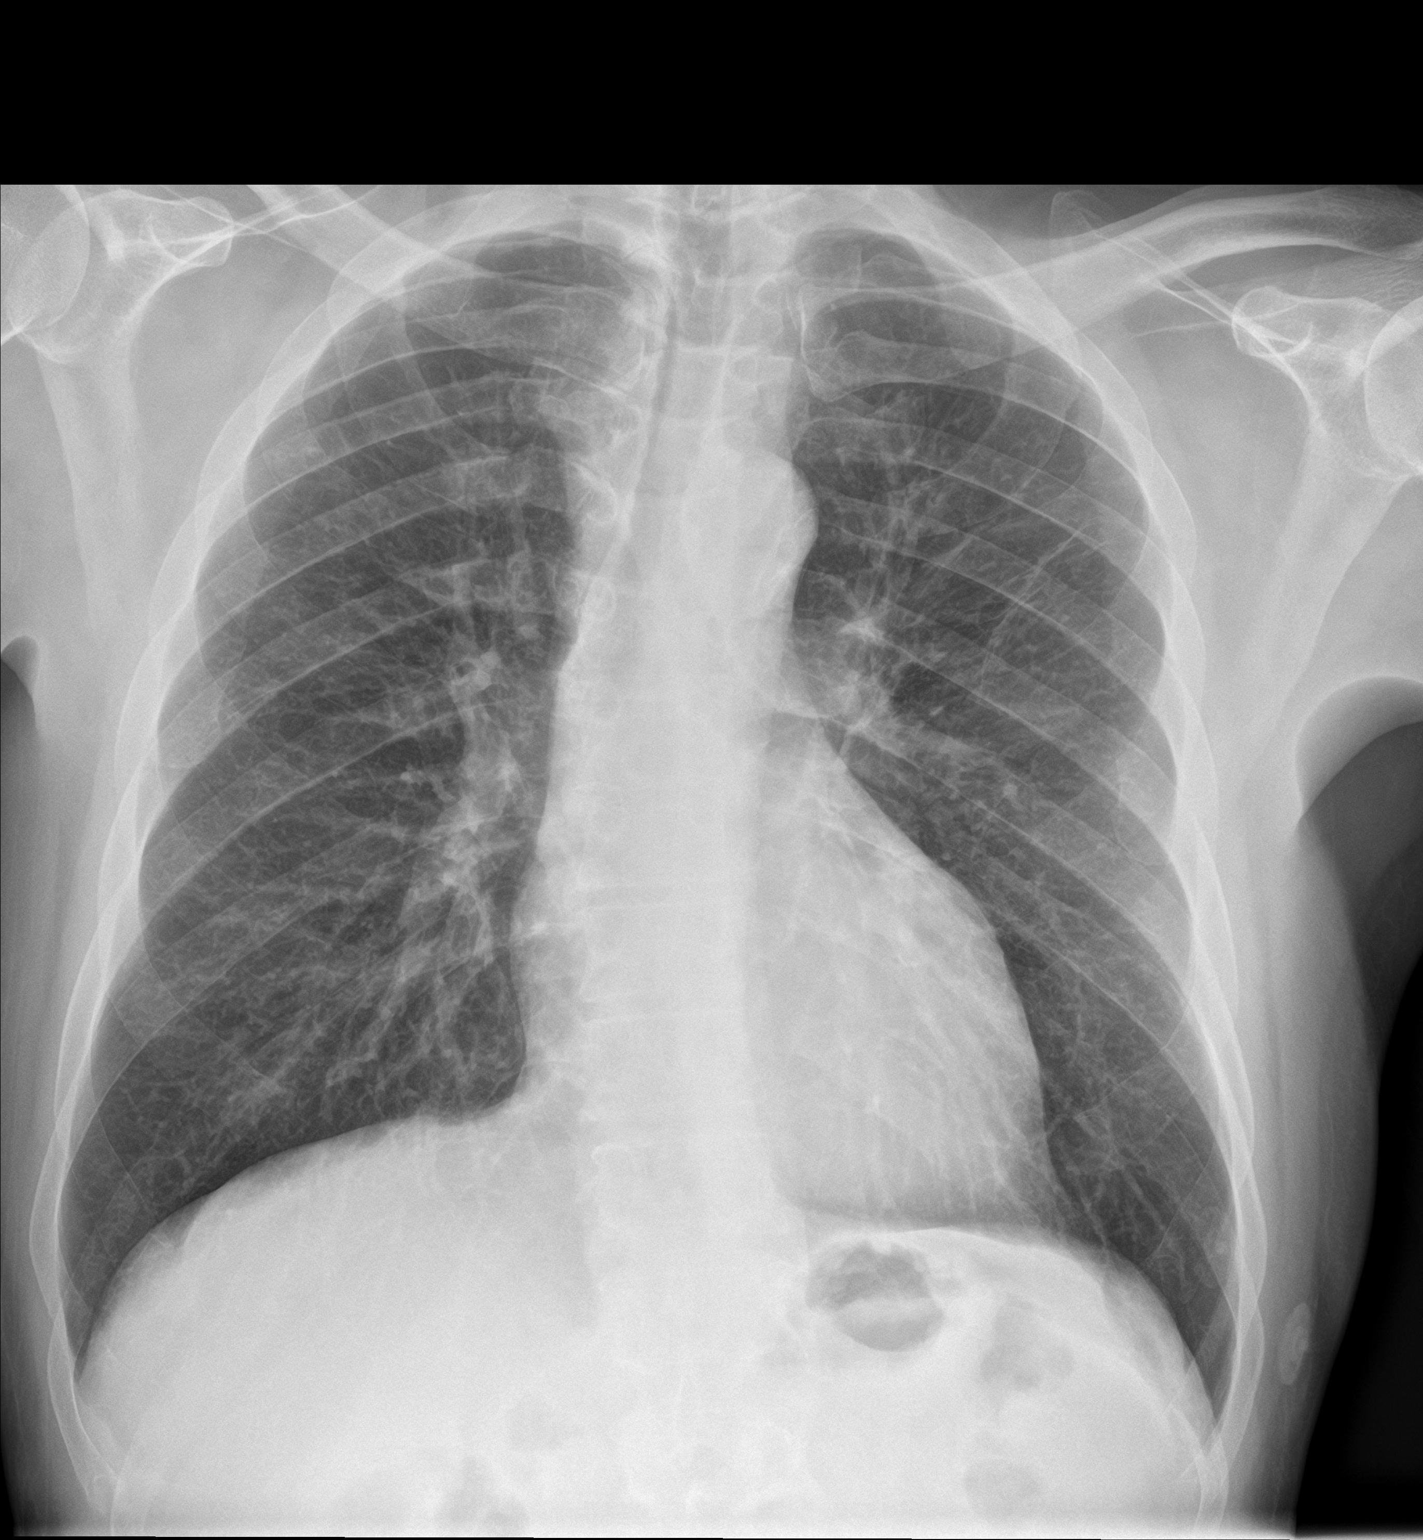

[chest lat]
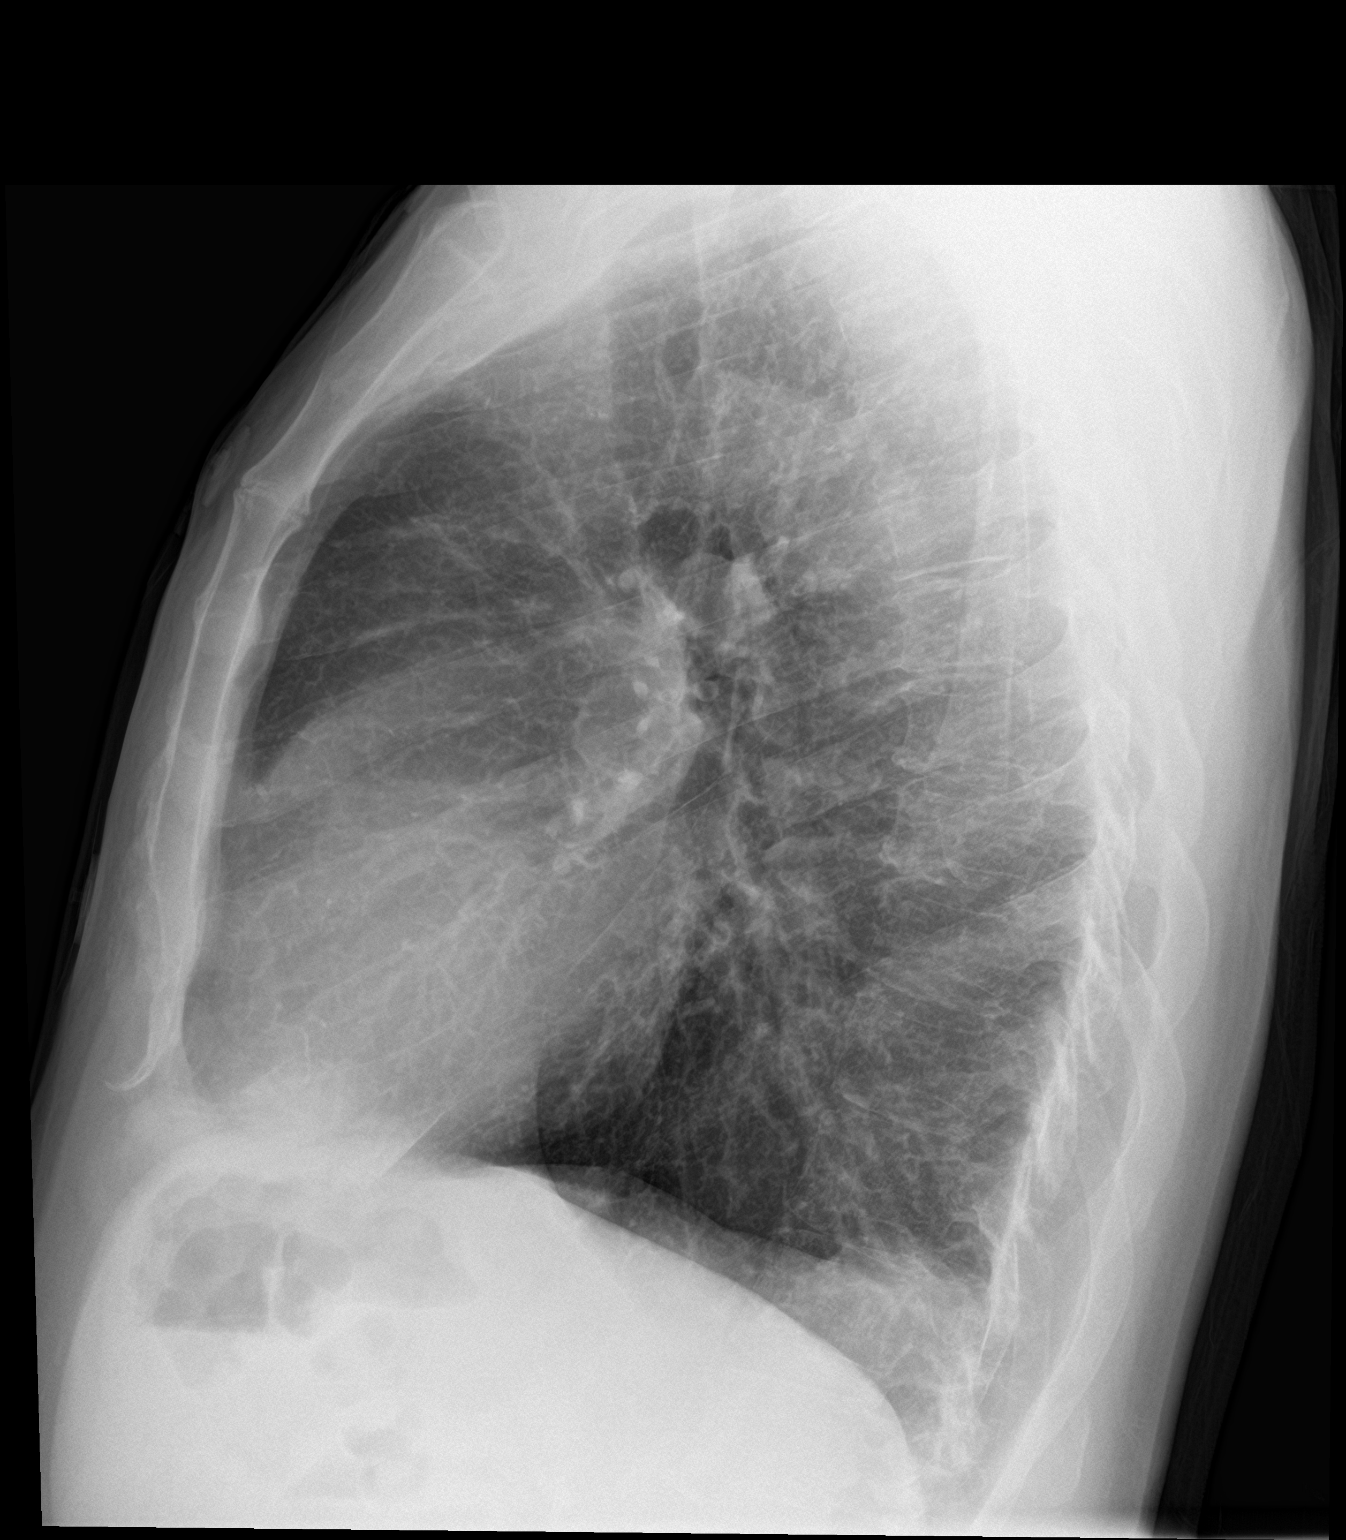

[2 of 2 positions shown; findings below may reference images not displayed]

FINDINGS: Frontal and lateral views of the chest demonstrate an unremarkable
cardiac silhouette. Diffuse interstitial scarring and hyperinflation
consistent with emphysema. No airspace disease, effusion, or
pneumothorax. No acute bony abnormalities.
IMPRESSION: 1. No acute intrathoracic process.
2. Emphysema.

## 2020-03-12 IMAGING — MR MR HEAD W/O CM
11 of 12 series · 40 of 48 positions shown · non-contrast
Comparison: None.

CLINICAL DATA: Neuro deficit, acute, stroke suspected

EXAM:
MRI HEAD WITHOUT CONTRAST
TECHNIQUE: Multiplanar, multiecho pulse sequences of the brain and surrounding
structures were obtained without intravenous contrast.

[Series 5: DWI · axial · 4.0mm · 0.88mm/px · z∈[-80,+54]mm · 4 of 36 slices shown (1 of 6)]
[im 1/36]
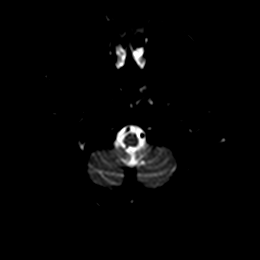
[im 12/36]
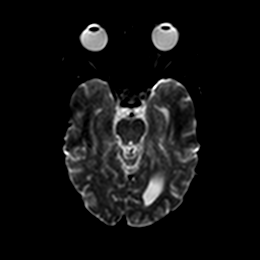
[im 24/36]
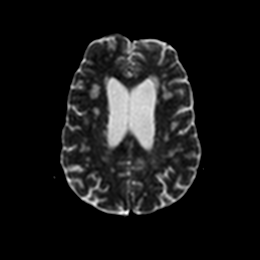
[im 36/36]
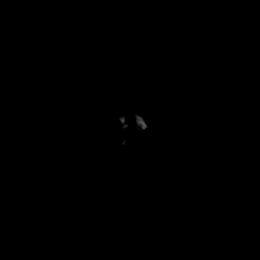

[Series 5: DWI · axial · 4.0mm · 0.88mm/px · z∈[-80,+54]mm · 4 of 36 slices shown (2 of 6)]
[im 1/36]
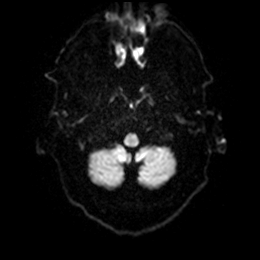
[im 12/36]
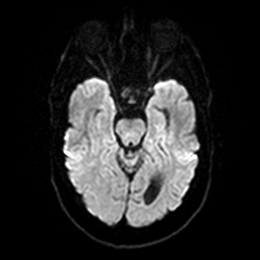
[im 24/36]
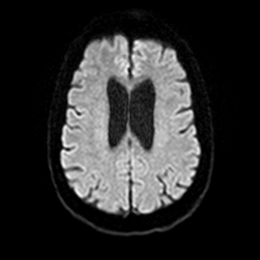
[im 36/36]
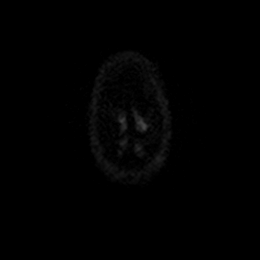

[Series 6: DWI · axial · 4.0mm · 0.88mm/px · z∈[-80,+54]mm · 4 of 36 slices shown (3 of 6)]
[im 1/36]
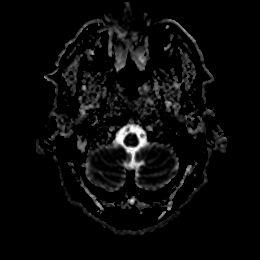
[im 12/36]
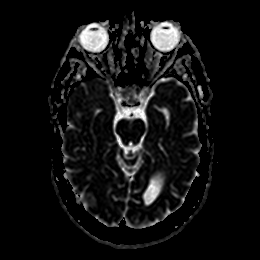
[im 24/36]
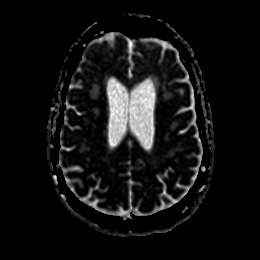
[im 36/36]
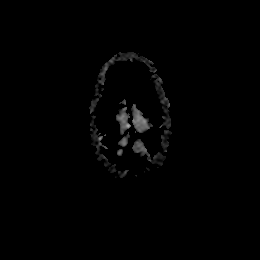

[Series 7: DWI · coronal · 5.0mm · 0.88mm/px · 3 of 28 slices shown (4 of 6)]
[im 1/28]
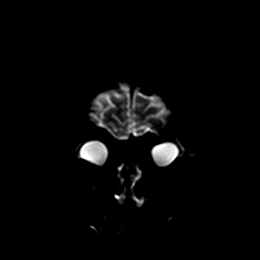
[im 14/28]
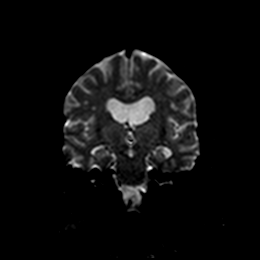
[im 28/28]
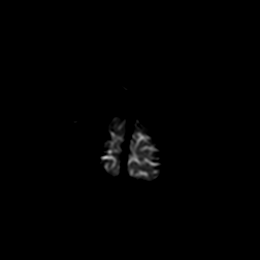

[Series 7: DWI · coronal · 5.0mm · 0.88mm/px · 4 of 28 slices shown (5 of 6)]
[im 1/28]
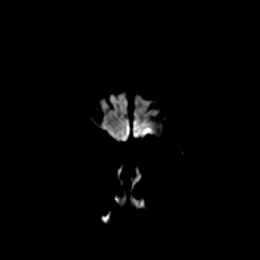
[im 10/28]
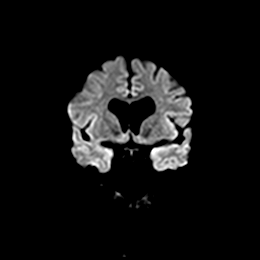
[im 19/28]
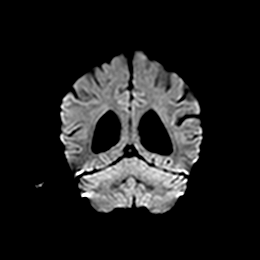
[im 28/28]
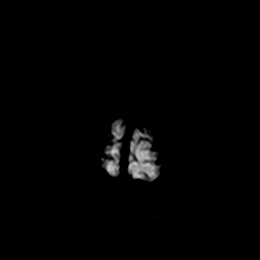

[Series 8: DWI · coronal · 5.0mm · 0.88mm/px · 4 of 28 slices shown (6 of 6)]
[im 1/28]
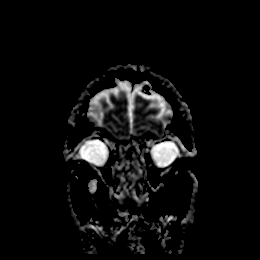
[im 10/28]
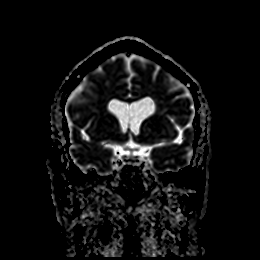
[im 19/28]
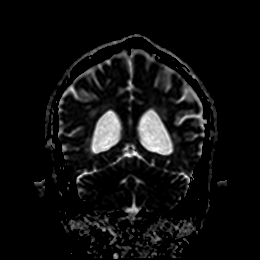
[im 28/28]
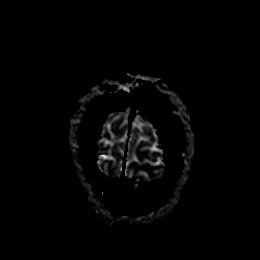

[Series 9: T1 · sagittal · 5.0mm · 0.94mm/px · 3 of 25 slices shown]
[im 1/25]
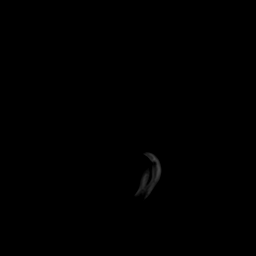
[im 13/25]
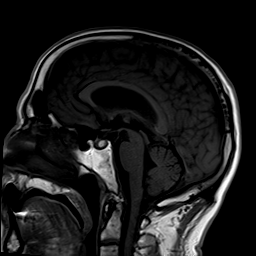
[im 25/25]
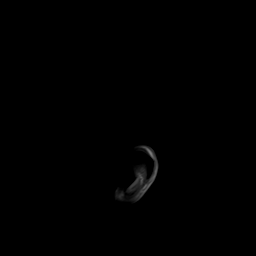

[Series 10: T2 · axial · 5.0mm · 0.72mm/px · z∈[-76,+51]mm · 3 of 20 slices shown (1 of 2)]
[im 1/20]
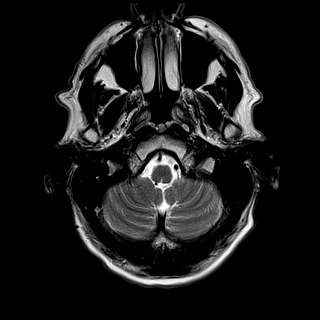
[im 10/20]
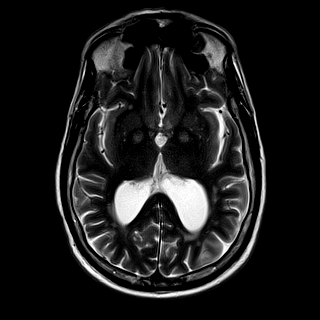
[im 20/20]
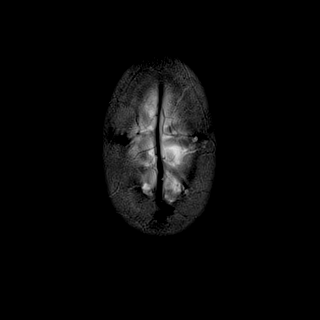

[Series 11: ax hemo · axial · 5.0mm · 0.86mm/px · z∈[-79,+59]mm · 3 of 25 slices shown]
[im 1/25]
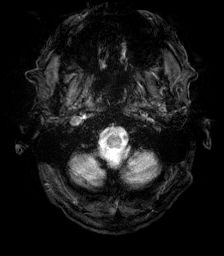
[im 13/25]
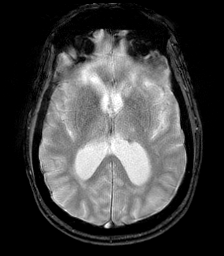
[im 25/25]
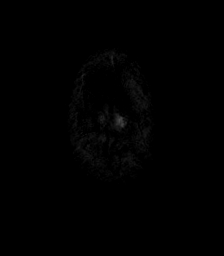

[Series 12: FLAIR · axial · 4.0mm · 0.43mm/px · z∈[-69,+50]mm · 4 of 32 slices shown]
[im 1/32]
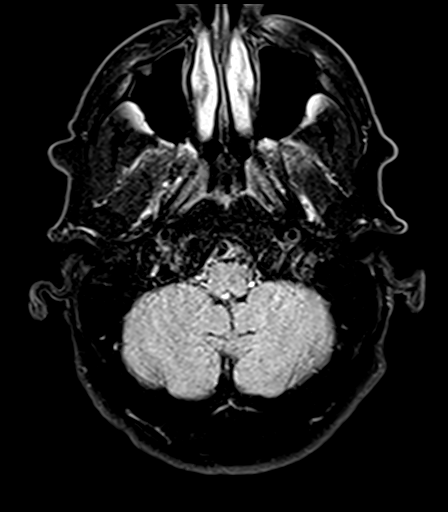
[im 11/32]
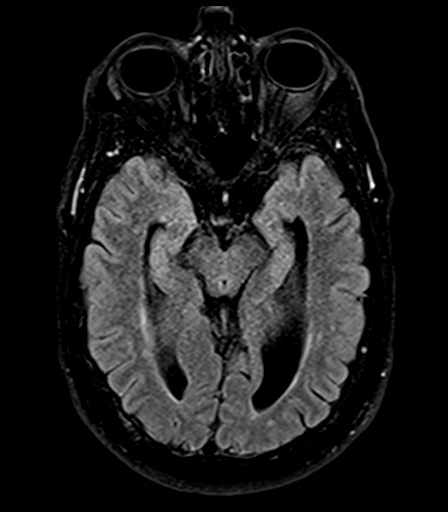
[im 21/32]
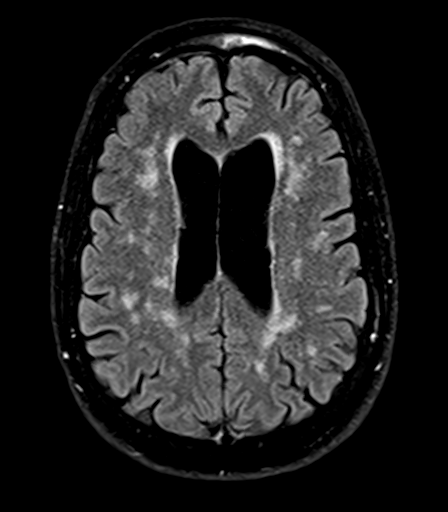
[im 32/32]
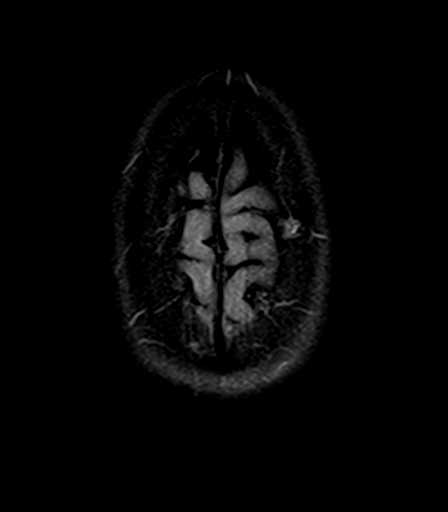

[Series 14: T2 · coronal · 5.0mm · 0.72mm/px · 4 of 28 slices shown (2 of 2)]
[im 1/28]
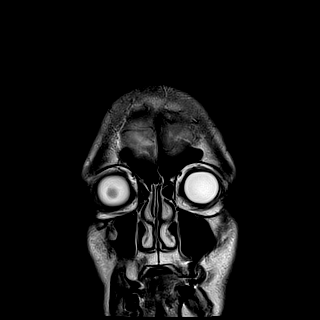
[im 10/28]
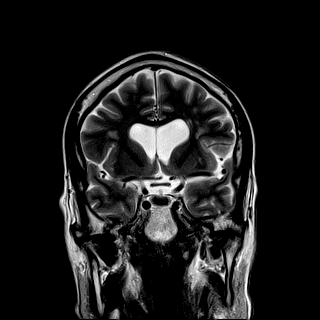
[im 19/28]
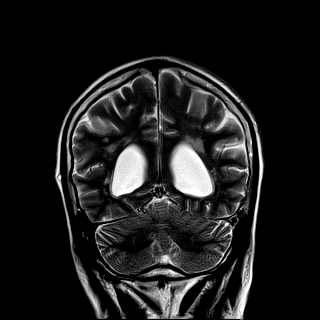
[im 28/28]
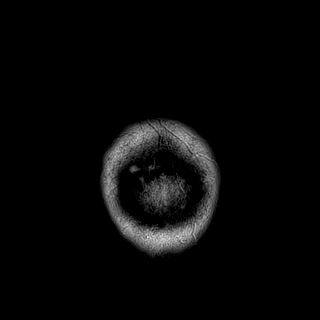

[40 of 48 positions shown; findings below may reference images not displayed]

FINDINGS: Brain: No diffusion-weighted signal abnormality. No intracranial
hemorrhage. No midline shift, ventriculomegaly or extra-axial fluid
collection. No mass lesion. Mild cerebral atrophy with ex vacuo
dilatation. Moderate chronic microvascular ischemic changes. Sequela
of remote insult involving the bilateral globus pallidus.

Vascular: Proximally preserved major intracranial flow voids.

Skull and upper cervical spine: Normal marrow signal.

Sinuses/Orbits: No acute orbital finding.  Clear paranasal sinuses.

Other: None.
IMPRESSION: No acute intracranial process.

Mild cerebral atrophy and moderate chronic microvascular ischemic
changes.

## 2020-03-12 MED ORDER — IOHEXOL 350 MG/ML SOLN
100.0000 mL | Freq: Once | INTRAVENOUS | Status: AC | PRN
  Administered 2020-03-12: 100 mL via INTRAVENOUS

## 2020-03-12 NOTE — ED Provider Notes (Signed)
[facility] EMERGENCY DEPARTMENT Provider Note   CSN: [id number] Arrival date & time: 03/12/20  1502     History Chief Complaint  Patient presents with  . Chest Pain    Craig Hahn is a 60 y.o. male.  HPI      Craig Hahn is a 60 y.o. 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  He states he has had similar episodes for 2 weeks.  He endorses having a syncopal episode 2 weeks ago with chest pain. He was seen at a local urgent care last week and again yesterday.  Had appt with cardiology today.  States that he was advised to come to the emergency department for further evaluation for possible cardiac process versus recurrent blood clot.  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  He was taking warfarin during that time but discontinued it 1 year ago because he was advised by his PCP to take a Covid vaccine which he refused.  He reports continued left upper chest pain that radiates to his left shoulder blade area and numbness and tingling to the left arm down to his fingers.  Symptoms have been associated with fatigue.  He denies fever, chills, cough, shortness of breath, abdominal pain, vomiting or diarrhea.  No known Covid exposures.  He is not vaccinated.  He states that the provider at the local urgent care started him back on Coumadin and he took his first dose yesterday. Hx of alcohol use, but denies recent use.     Past Medical History:  Diagnosis Date  . Allergy   . Anxiety   . Clotting disorder (HCC)    Unsure if has ever been worked up, per pet has had 9 clots in the past, last was in 2006, both brothers also have clotting issues  . Depression   . DVT (deep venous thrombosis) [facility])     Patient Active Problem List   Diagnosis Date Noted  . Clotting disorder (HCC)  01/21/2017  . Encounter for therapeutic drug monitoring 04/07/2016  . Alcohol abuse, daily use 12/20/2015  . LPRD (laryngopharyngeal reflux disease) 12/20/2015  . DVT (deep venous thrombosis) (HCC) 11/23/2013    Past Surgical History:  Procedure Laterality Date  . dental implants     x8       Family History  Problem Relation Age of Onset  . Heart attack Father   . Clotting disorder Brother   . Clotting disorder Brother     Social History   Tobacco Use  . Smoking status: Current Some Day Smoker    Packs/day: 1.00    Types: E-cigarettes, Cigarettes    Last attempt to quit: 01/14/2017    Years since quitting: 3.1  . Smokeless tobacco: Never Used  Vaping Use  . Vaping Use: Never used  Substance Use Topics  . Alcohol use: Yes    Comment: daily 4 beers   . Drug use: No    Home Medications Prior to Admission medications   Medication Sig Start Date End Date Taking? Authorizing Provider  OVER THE COUNTER MEDICATION Take 1 capsule by mouth as directed. Royal maca    [provider]  warfarin (COUMADIN) 2 MG tablet Take 1 tablet (2mg  total) along with (5 mg) by mouth Tuesday,Thursday, Saturday, and Sunday 02/09/19  Waldon Merl, PA-C  warfarin (COUMADIN) 5 MG tablet Take 2 tablets (10 mg total) by mouth Monday, Wednesday, Friday. Take 1 tablet (5 mg total) by mouth Tuesday,Thursday, Saturday, and Sunday along with (2 mg) 02/09/19   Waldon Merl, PA-C    Allergies    Codeine, Penicillins, and Sulfa antibiotics  Review of Systems   Review of Systems  Constitutional: Positive for fatigue. Negative for chills and fever.  HENT: Negative for sore throat and trouble swallowing.   Respiratory: Positive for shortness of breath. Negative for cough and wheezing.   Cardiovascular: Positive for chest pain. Negative for palpitations and leg swelling.  Gastrointestinal: Negative for abdominal pain, diarrhea, nausea and vomiting.  Genitourinary: Negative for difficulty  urinating, dysuria and flank pain.  Musculoskeletal: Negative for arthralgias, back pain, myalgias, neck pain and neck stiffness.  Skin: Negative for rash.  Neurological: Positive for syncope. Negative for dizziness, weakness and numbness.  Hematological: Does not bruise/bleed easily.  Psychiatric/Behavioral: Negative for confusion.    Physical Exam Updated Vital Signs BP (!) 139/98 (BP Location: Right Arm)   Pulse (!) 53   Temp 97.9 F (36.6 C) (Oral)   Resp 18   Ht 6\' 2"  (1.88 m)   Wt 72.6 kg   SpO2 100%   BMI 20.54 kg/m   Physical Exam Vitals and nursing note reviewed.  Constitutional:      General: He is not in acute distress.    Appearance: Normal appearance.  HENT:     Head: Atraumatic.  Eyes:     Extraocular Movements: Extraocular movements intact.     Conjunctiva/sclera: Conjunctivae normal.     Pupils: Pupils are equal, round, and reactive to light.  Cardiovascular:     Rate and Rhythm: Regular rhythm. Bradycardia present.     Pulses: Normal pulses.  Pulmonary:     Effort: Pulmonary effort is normal.     Breath sounds: Normal breath sounds.  Abdominal:     Palpations: Abdomen is soft.     Tenderness: There is no abdominal tenderness.  Musculoskeletal:        General: Normal range of motion.     Cervical back: Normal range of motion. No tenderness.     Right lower leg: No edema.     Left lower leg: No edema.  Skin:    General: Skin is warm.     Capillary Refill: Capillary refill takes less than 2 seconds.     Findings: No rash.  Neurological:     General: No focal deficit present.     Mental Status: He is alert.     GCS: GCS eye subscore is 4. GCS verbal subscore is 5. GCS motor subscore is 6.     Sensory: Sensation is intact. No sensory deficit.     Motor: Motor function is intact. No weakness.     Coordination: Coordination is intact.     Gait: Gait normal.     Comments: CN II-XII intact.  Speech clear.  No pronator drift.  Normal finger-nose  testing.  No facial droop or dysarthria     ED Results / Procedures / Treatments   Labs (all labs ordered are listed, but only abnormal results are displayed) Labs Reviewed  CBC - Abnormal; Notable for the following components:      Result Value   MCV 100.2 (*)    All other components within normal limits  D-DIMER, QUANTITATIVE (NOT AT [facility]) - Abnormal; Notable for the following components:   D-Dimer,  Quant 1.03 (*)    All other components within normal limits  BASIC METABOLIC PANEL  ETHANOL  TROPONIN I (HIGH SENSITIVITY)  TROPONIN I (HIGH SENSITIVITY)    EKG EKG Interpretation  Date/Time:  Tuesday March 12 2020 15:54:25 EST Ventricular Rate:  47 PR Interval:  192 QRS Duration: 84 QT Interval:  424 QTC Calculation: 375 R Axis:   46 Text Interpretation: Sinus bradycardia Anteroseptal infarct , age undetermined Abnormal ECG Since last tracing rate slower Otherwise no significant change Confirmed by Mancel Bale [id number]) on 03/12/2020 4:07:36 PM   Radiology DG Chest 2 View  Result Date: 03/12/2020 CLINICAL DATA:  Chest pain for 2 months, weakness, dizziness, tobacco abuse EXAM: CHEST - 2 VIEW COMPARISON:  03/07/2020 FINDINGS: Frontal and lateral views of the chest demonstrate an unremarkable cardiac silhouette. Diffuse interstitial scarring and hyperinflation consistent with emphysema. No airspace disease, effusion, or pneumothorax. No acute bony abnormalities. IMPRESSION: 1. No acute intrathoracic process. 2. Emphysema. Electronically Signed   By: Sharlet Salina M.D.   On: 03/12/2020 17:01   CT Angio Chest PE W and/or Wo Contrast  Result Date: 03/12/2020 CLINICAL DATA:  60 year old male with concern for pulmonary embolism. EXAM: CT ANGIOGRAPHY CHEST WITH CONTRAST TECHNIQUE: Multidetector CT imaging of the chest was performed using the standard protocol during bolus administration of intravenous contrast. Multiplanar CT image reconstructions and MIPs were obtained to evaluate  the vascular anatomy. CONTRAST:  OMNIPAQUE IOHEXOL 350 MG/ML SOLN COMPARISON:  Chest CT dated 10/04/2015 and radiograph dated 03/12/2020. FINDINGS: Cardiovascular: There is mild cardiomegaly. No pericardial effusion. Mild atherosclerotic calcification of the thoracic aorta. Evaluation of the aorta is limited due to suboptimal opacification and timing of the contrast. Nonocclusive linear filling defect in the right lower lobe pulmonary artery branch (157/5) was seen on the prior CT, and likely sequela of prior embolus or scarring. No CT evidence of acute pulmonary artery embolus. Mediastinum/Nodes: No hilar or mediastinal adenopathy. The esophagus and thyroid gland are grossly unremarkable. No mediastinal fluid collection. Lungs/Pleura: Linear left upper lobe scarring. No focal consolidation, pleural effusion, or pneumothorax. The central airways are patent. Upper Abdomen: No acute abnormality. Musculoskeletal: No chest wall abnormality. No acute or significant osseous findings. Review of the MIP images confirms the above findings. IMPRESSION: 1. No acute intrathoracic pathology. Nonocclusive linear filling defect in the right lower lobe pulmonary artery branch was seen on the prior CT, and likely sequela of prior embolus or scarring. No CT evidence of acute pulmonary artery embolus. 2. Mild cardiomegaly. 3. Aortic Atherosclerosis (ICD10-I70.0). Electronically Signed   By: Elgie Collard M.D.   On: 03/12/2020 22:41   MR BRAIN WO CONTRAST  Result Date: 03/12/2020 CLINICAL DATA:  Neuro deficit, acute, stroke suspected EXAM: MRI HEAD WITHOUT CONTRAST TECHNIQUE: Multiplanar, multiecho pulse sequences of the brain and surrounding structures were obtained without intravenous contrast. COMPARISON:  None. FINDINGS: Brain: No diffusion-weighted signal abnormality. No intracranial hemorrhage. No midline shift, ventriculomegaly or extra-axial fluid collection. No mass lesion. Mild cerebral atrophy with ex vacuo  dilatation. Moderate chronic microvascular ischemic changes. Sequela of remote insult involving the bilateral globus pallidus. Vascular: Proximally preserved major intracranial flow voids. Skull and upper cervical spine: Normal marrow signal. Sinuses/Orbits: No acute orbital finding.  Clear paranasal sinuses. Other: None. IMPRESSION: No acute intracranial process. Mild cerebral atrophy and moderate chronic microvascular ischemic changes. Electronically Signed   By: Stana Bunting M.D.   On: 03/12/2020 16:52    Procedures Procedures   Medications Ordered in ED Medications - No  data to display  ED Course  I have reviewed the triage vital signs and the nursing notes.  Pertinent labs & imaging results that were available during my care of the patient were reviewed by me and considered in my medical decision making (see chart for details).    MDM Rules/Calculators/A&P                          Patient here with complaint of intermittent chest pain x2 weeks.  Endorses a syncopal event with chest pain 2 weeks ago, but not since.  Today, had left sided chest pain with numbness and tingling of his left arm.  Seen by local urgent care and had appointment with cardiology today.  He was recommended to come to the emergency department for further evaluation.  History of multiple DVTs and PE 4 years ago.  He has been off of his warfarin x1 year.  Started back yesterday.  Denies significant shortness of breath.  Vitals reviewed.  He is bradycardic but has history of the same.  No tachycardia, tachypnea or hypoxia.  Patient reports having active chest pain and tingling of the left arm at time of EKG.  Refused covid testing this evening.    On recheck, patient resting comfortably.  He is ambulated to the restroom several times without difficulty.  Labs interpreted by me,.  No leukocytosis, blood alcohol within normal limits.  Electrolytes unremarkable.  D-dimer slightly elevated at 1.03.  Given patient's  history of multiple DVTs and PE CT angio of the chest has been ordered.  CT angio chest negative for acute pulmonary embolus.  EKG shows bradycardia without acute ischemic changes.  Troponin reassuring.  I feel that patient is appropriate for discharge home, have discussed importance of close follow-up with cardiology as he may need stress test.  Patient agrees to plan.  Strict return precautions were also given.   Final Clinical Impression(s) / ED Diagnoses Final diagnoses:  Atypical chest pain    Rx / DC Orders ED Discharge Orders    None       Rosey Bath 03/12/20 2330    Mancel Bale, MD 03/13/20 1419

## 2020-03-12 NOTE — ED Notes (Signed)
States he went to an urgent care yesterday and was advised to go to the ED for treatment

## 2020-03-12 NOTE — Discharge Instructions (Addendum)
The CT of your chest this evening did not show evidence of an acute blood clot of your lungs.  Call the cardiology office to arrange a follow-up appointment.  Return to the emergency department for any worsening symptoms.  Continue to take your warfarin as directed.

## 2020-03-12 NOTE — ED Notes (Signed)
Pt transported to CT ?

## 2020-03-12 NOTE — ED Triage Notes (Signed)
C/o chest pain onset 2 months ago

## 2020-04-12 ENCOUNTER — Emergency Department (HOSPITAL_COMMUNITY): Payer: Self-pay

## 2020-04-12 ENCOUNTER — Encounter (HOSPITAL_COMMUNITY): Payer: Self-pay

## 2020-04-12 ENCOUNTER — Emergency Department (HOSPITAL_COMMUNITY)
Admission: EM | Admit: 2020-04-12 | Discharge: 2020-04-12 | Disposition: A | Discharge status: Home / Self Care | Payer: Self-pay | Attending: Emergency Medicine | Admitting: Emergency Medicine

## 2020-04-12 ENCOUNTER — Other Ambulatory Visit: Payer: Self-pay

## 2020-04-12 DIAGNOSIS — Z86718 Personal history of other venous thrombosis and embolism: Secondary | ICD-10-CM | POA: Insufficient documentation

## 2020-04-12 DIAGNOSIS — R001 Bradycardia, unspecified: Secondary | ICD-10-CM | POA: Insufficient documentation

## 2020-04-12 DIAGNOSIS — Z87891 Personal history of nicotine dependence: Secondary | ICD-10-CM | POA: Insufficient documentation

## 2020-04-12 DIAGNOSIS — R5383 Other fatigue: Secondary | ICD-10-CM | POA: Insufficient documentation

## 2020-04-12 DIAGNOSIS — R55 Syncope and collapse: Secondary | ICD-10-CM | POA: Insufficient documentation

## 2020-04-12 DIAGNOSIS — Z7901 Long term (current) use of anticoagulants: Secondary | ICD-10-CM | POA: Insufficient documentation

## 2020-04-12 DIAGNOSIS — Z86711 Personal history of pulmonary embolism: Secondary | ICD-10-CM | POA: Insufficient documentation

## 2020-04-12 DIAGNOSIS — R42 Dizziness and giddiness: Secondary | ICD-10-CM | POA: Insufficient documentation

## 2020-04-12 DIAGNOSIS — G8929 Other chronic pain: Secondary | ICD-10-CM

## 2020-04-12 DIAGNOSIS — R0789 Other chest pain: Secondary | ICD-10-CM | POA: Insufficient documentation

## 2020-04-12 LAB — CBC WITH DIFFERENTIAL/PLATELET
Abs Immature Granulocytes: 0.02 10*3/uL (ref 0.00–0.07)
Basophils Absolute: 0.1 10*3/uL (ref 0.0–0.1)
Basophils Relative: 1 %
Eosinophils Absolute: 0.1 10*3/uL (ref 0.0–0.5)
Eosinophils Relative: 1 %
HCT: 46.7 % (ref 39.0–52.0)
Hemoglobin: 16 g/dL (ref 13.0–17.0)
Immature Granulocytes: 0 %
Lymphocytes Relative: 25 %
Lymphs Abs: 1.5 10*3/uL (ref 0.7–4.0)
MCH: 33.6 pg (ref 26.0–34.0)
MCHC: 34.3 g/dL (ref 30.0–36.0)
MCV: 98.1 fL (ref 80.0–100.0)
Monocytes Absolute: 0.5 10*3/uL (ref 0.1–1.0)
Monocytes Relative: 9 %
Neutro Abs: 3.7 10*3/uL (ref 1.7–7.7)
Neutrophils Relative %: 64 %
Platelets: 199 10*3/uL (ref 150–400)
RBC: 4.76 MIL/uL (ref 4.22–5.81)
RDW: 13.2 % (ref 11.5–15.5)
WBC: 5.8 10*3/uL (ref 4.0–10.5)
nRBC: 0 % (ref 0.0–0.2)

## 2020-04-12 LAB — COMPREHENSIVE METABOLIC PANEL
ALT: 14 U/L (ref 0–44)
AST: 21 U/L (ref 15–41)
Albumin: 4.1 g/dL (ref 3.5–5.0)
Alkaline Phosphatase: 47 U/L (ref 38–126)
Anion gap: 8 (ref 5–15)
BUN: 13 mg/dL (ref 8–23)
CO2: 25 mmol/L (ref 22–32)
Calcium: 9.1 mg/dL (ref 8.9–10.3)
Chloride: 106 mmol/L (ref 98–111)
Creatinine, Ser: 0.66 mg/dL (ref 0.61–1.24)
GFR, Estimated: >60 mL/min (ref >60)
Glucose, Bld: 89 mg/dL (ref 70–99)
Potassium: 4.3 mmol/L (ref 3.5–5.1)
Sodium: 139 mmol/L (ref 135–145)
Total Bilirubin: 0.9 mg/dL (ref 0.3–1.2)
Total Protein: 7.2 g/dL (ref 6.5–8.1)

## 2020-04-12 LAB — PROTIME-INR
INR: 2.4 — ABNORMAL HIGH (ref 0.8–1.2)
Prothrombin Time: 25.7 seconds — ABNORMAL HIGH (ref 11.4–15.2)

## 2020-04-12 LAB — TROPONIN I (HIGH SENSITIVITY)
Troponin I (High Sensitivity): 5 ng/L (ref <18)
Troponin I (High Sensitivity): 3 ng/L (ref <18)

## 2020-04-12 LAB — LIPASE, BLOOD: Lipase: 42 U/L (ref 11–51)

## 2020-04-12 IMAGING — CT CT ANGIO CHEST-ABD-PELV FOR DISSECTION W/ AND WO/W CM
2 of 7 series · 13 of 46 positions shown, 15 images · non-contrast
Comparison: None.

CLINICAL DATA: Left-sided chest pain

EXAM:
CT ANGIOGRAPHY CHEST, ABDOMEN AND PELVIS
TECHNIQUE: Non-contrast CT of the chest was initially obtained.

[Series 6: axial arterial · axial · arterial · 0.86mm/px · z∈[+1166,+1760]mm · 10 of 232 slices shown, 12 images]
[im 17/232  soft-tissue]
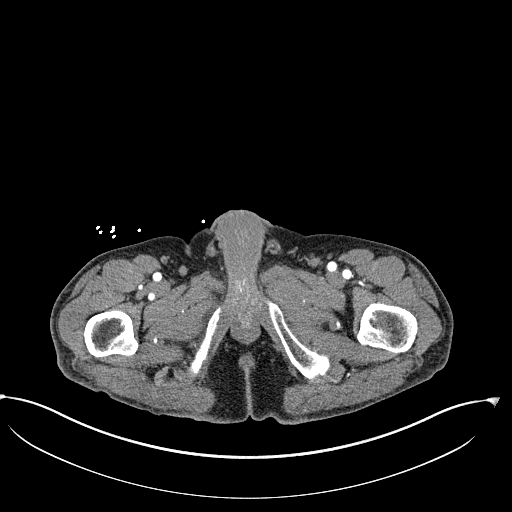
[im 17/232  bone]
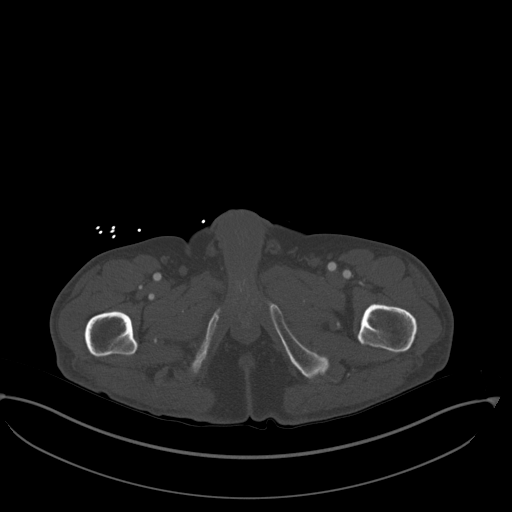
[im 34/232  soft-tissue]
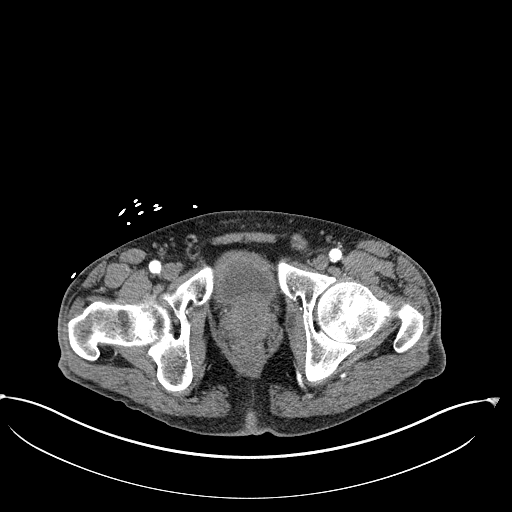
[im 67/232  soft-tissue]
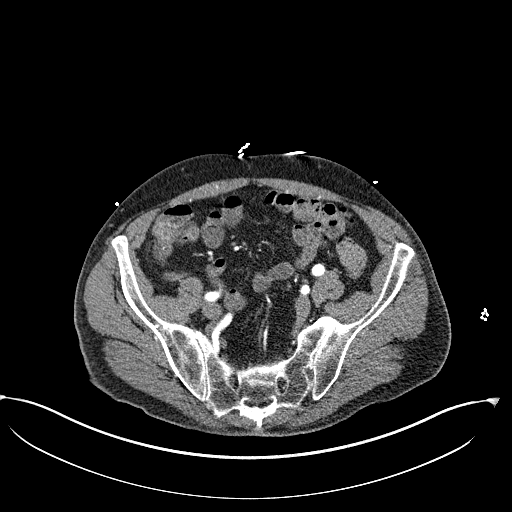
[im 83/232  soft-tissue]
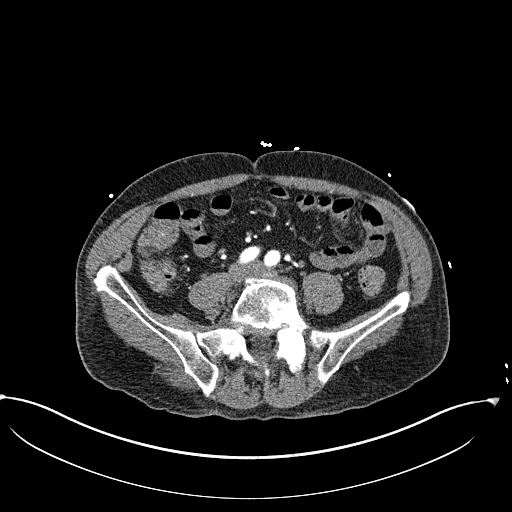
[im 100/232  soft-tissue]
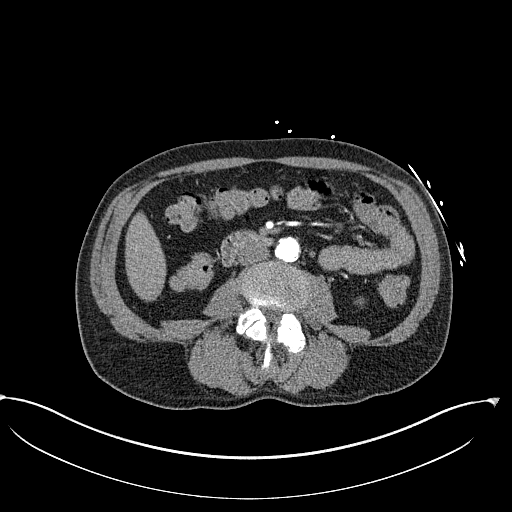
[im 133/232  soft-tissue]
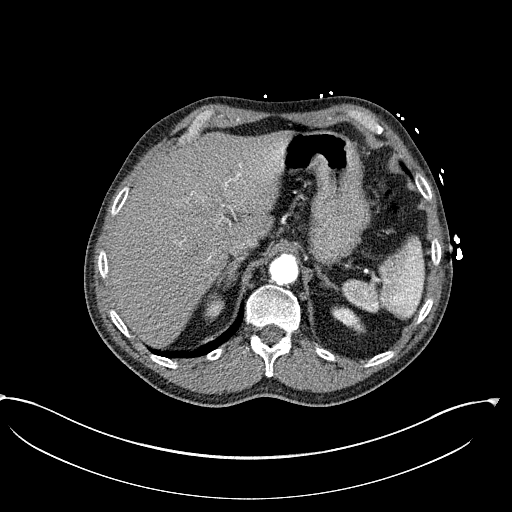
[im 149/232  soft-tissue]
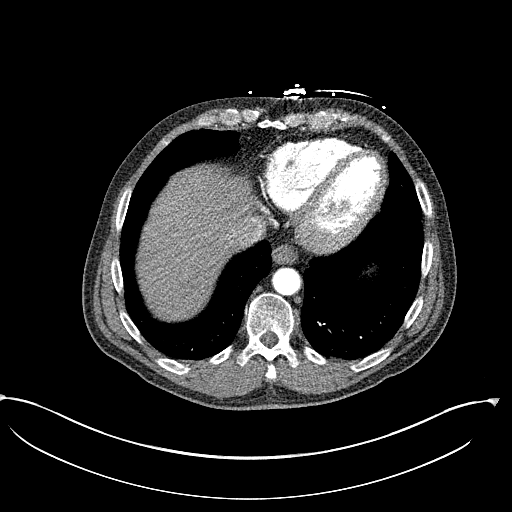
[im 166/232  soft-tissue]
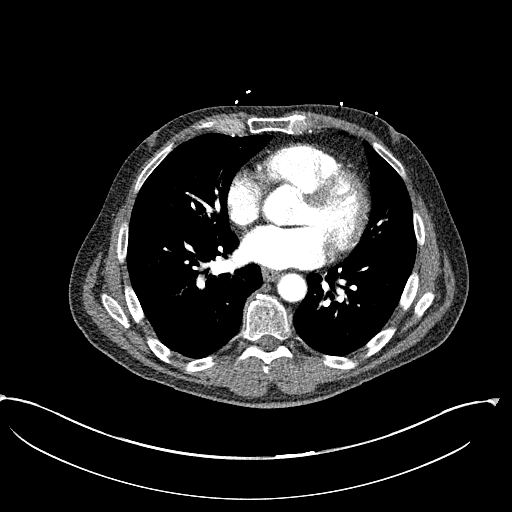
[im 199/232  soft-tissue]
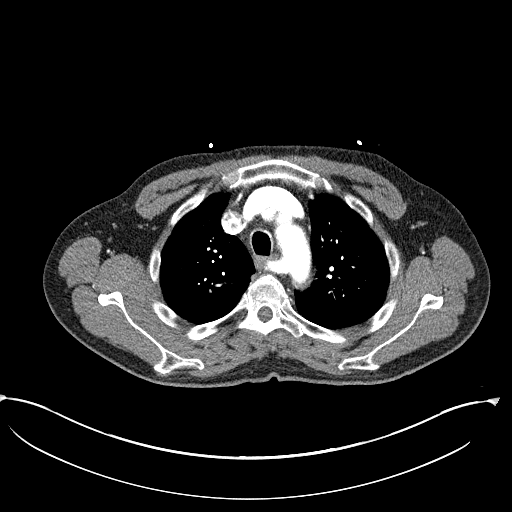
[im 199/232  bone]
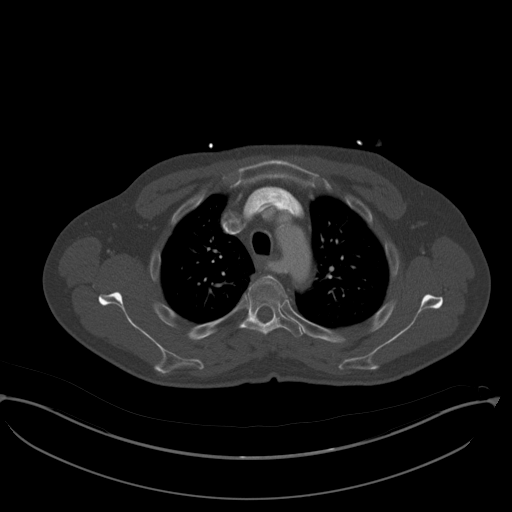
[im 215/232  soft-tissue]
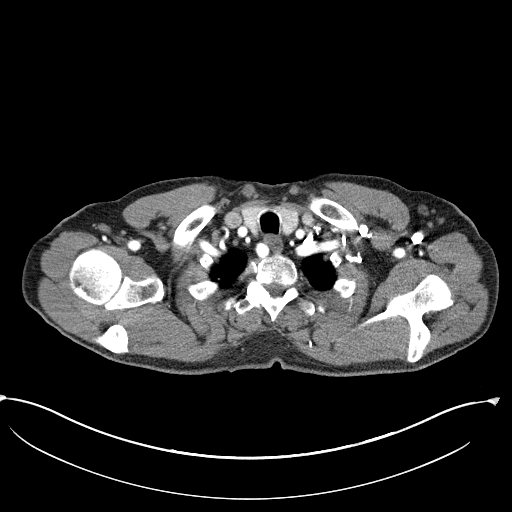

[Series 7: coronals · coronal · 0.79mm/px · 3 of 137 slices shown]
[im 35/137  soft-tissue]
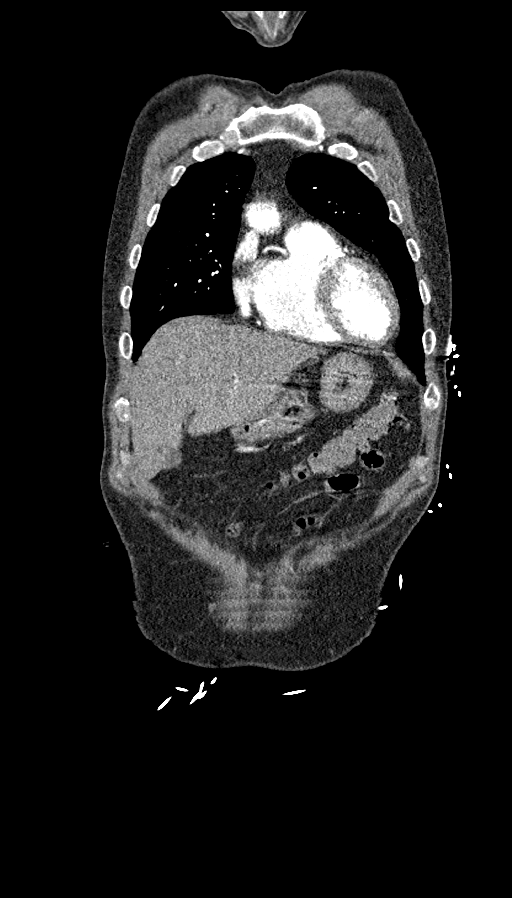
[im 69/137  soft-tissue]
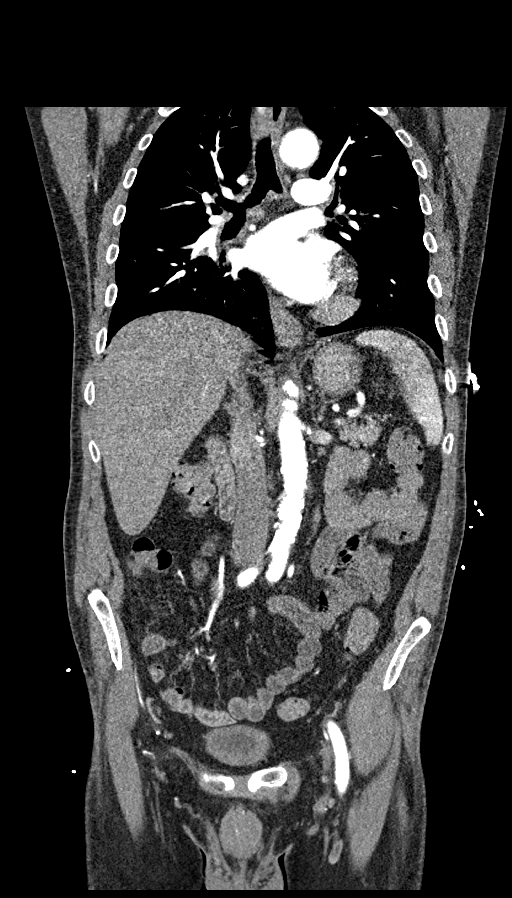
[im 103/137  soft-tissue]
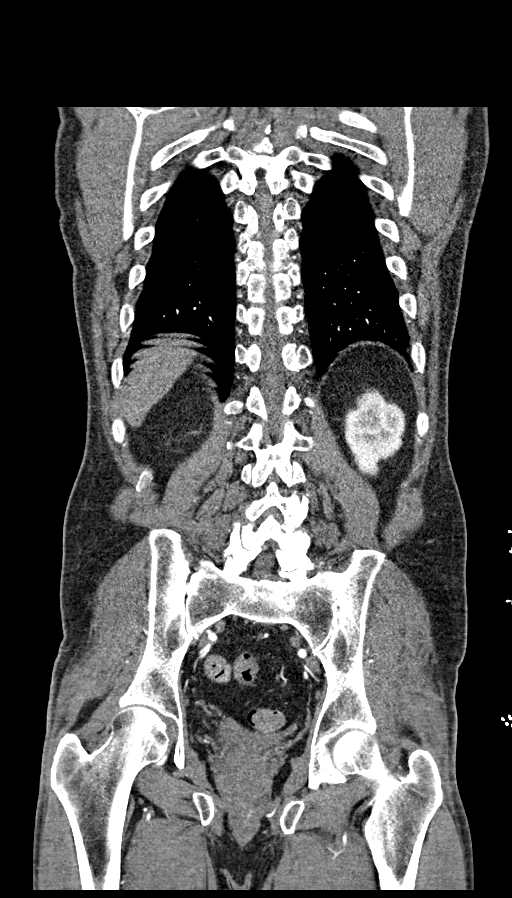

[13 of 46 positions shown; findings below may reference images not displayed]

Multidetector CT imaging through the chest, abdomen and pelvis was
performed using the standard protocol during bolus administration of
intravenous contrast. Multiplanar reconstructed images and MIPs were
obtained and reviewed to evaluate the vascular anatomy.

CONTRAST:  100mL OMNIPAQUE IOHEXOL 350 MG/ML SOLN
FINDINGS: CTA CHEST FINDINGS

Cardiovascular:

--Heart: The heart size is normal.  There is nopericardial effusion.

--Aorta: The course and caliber of the thoracic aorta are normal.
There is aortic atherosclerotic calcification. Precontrast images
show no aortic intramural hematoma. There is no blood pool,
dissection or penetrating ulcer demonstrated on arterial phase
postcontrast imaging. There is an aberrant right subclavian artery.
The left vertebral artery arises directly from the aortic arch. The
proximal arch vessels are widely patent.

--Pulmonary Arteries: Contrast timing is optimized for preferential
opacification of the aorta. Within that limitation, normal central
pulmonary arteries.

Mediastinum/Nodes: No mediastinal, hilar or axillary
lymphadenopathy. The visualized thyroid and thoracic esophageal
course are unremarkable.

Lungs/Pleura: No pulmonary nodules or masses. No pleural effusion or
pneumothorax. No focal airspace consolidation. No focal pleural
abnormality.

Musculoskeletal: No chest wall abnormality. No acute osseous
findings.

Review of the MIP images confirms the above findings.

CTA ABDOMEN AND PELVIS FINDINGS

VASCULAR

Aorta: Normal caliber aorta without aneurysm, dissection, vasculitis
or hemodynamically significant stenosis. There is no aortic
atherosclerosis.

Celiac: No aneurysm, dissection or hemodynamically significant
stenosis. Normal branching pattern.

SMA: Widely patent without dissection or stenosis.

Renals: Single renal arteries bilaterally. No aneurysm, dissection,
stenosis or evidence of fibromuscular dysplasia.

IMA: Patent without abnormality.

Inflow: No aneurysm, stenosis or dissection.

Veins: Normal course and caliber of the major veins. Assessment is
otherwise limited by the arterial dominant contrast phase.

Review of the MIP images confirms the above findings.

NON-VASCULAR

Hepatobiliary: Normal hepatic contours and density. No visible
biliary dilatation. Normal gallbladder.

Pancreas: Normal contours without ductal dilatation. No
peripancreatic fluid collection.

Spleen: Normal arterial phase splenic enhancement pattern.

Adrenals/Urinary Tract:

--Adrenal glands: Normal.

--Right kidney/ureter: No hydronephrosis or perinephric stranding.
No nephrolithiasis. No obstructing ureteral stones.

--Left kidney/ureter: No hydronephrosis or perinephric stranding. No
nephrolithiasis. No obstructing ureteral stones.

--Urinary bladder: Unremarkable.

Stomach/Bowel:

--Stomach/Duodenum: No hiatal hernia or other gastric abnormality.
Normal duodenal course and caliber.

--Small bowel: No dilatation or inflammation.

--Colon: No focal abnormality.

--Appendix: Normal.

Lymphatic:  No abdominal or pelvic lymphadenopathy.

Reproductive: Normal prostate and seminal vesicles.

Musculoskeletal. No bony spinal canal stenosis or focal osseous
abnormality.

Other: None.

Review of the MIP images confirms the above findings.
IMPRESSION: 1. No acute aortic syndrome or other acute abnormality of the chest,
abdomen or pelvis.
2. Aberrant right subclavian artery, a normal variant.

Aortic Atherosclerosis ([13]-[13]).

## 2020-04-12 IMAGING — CR DG CHEST 2V
2 series · 2 of 2 positions shown · non-contrast
Comparison: [DATE]

CLINICAL DATA: Chest pain

EXAM:
CHEST - 2 VIEW

[w chest pa]
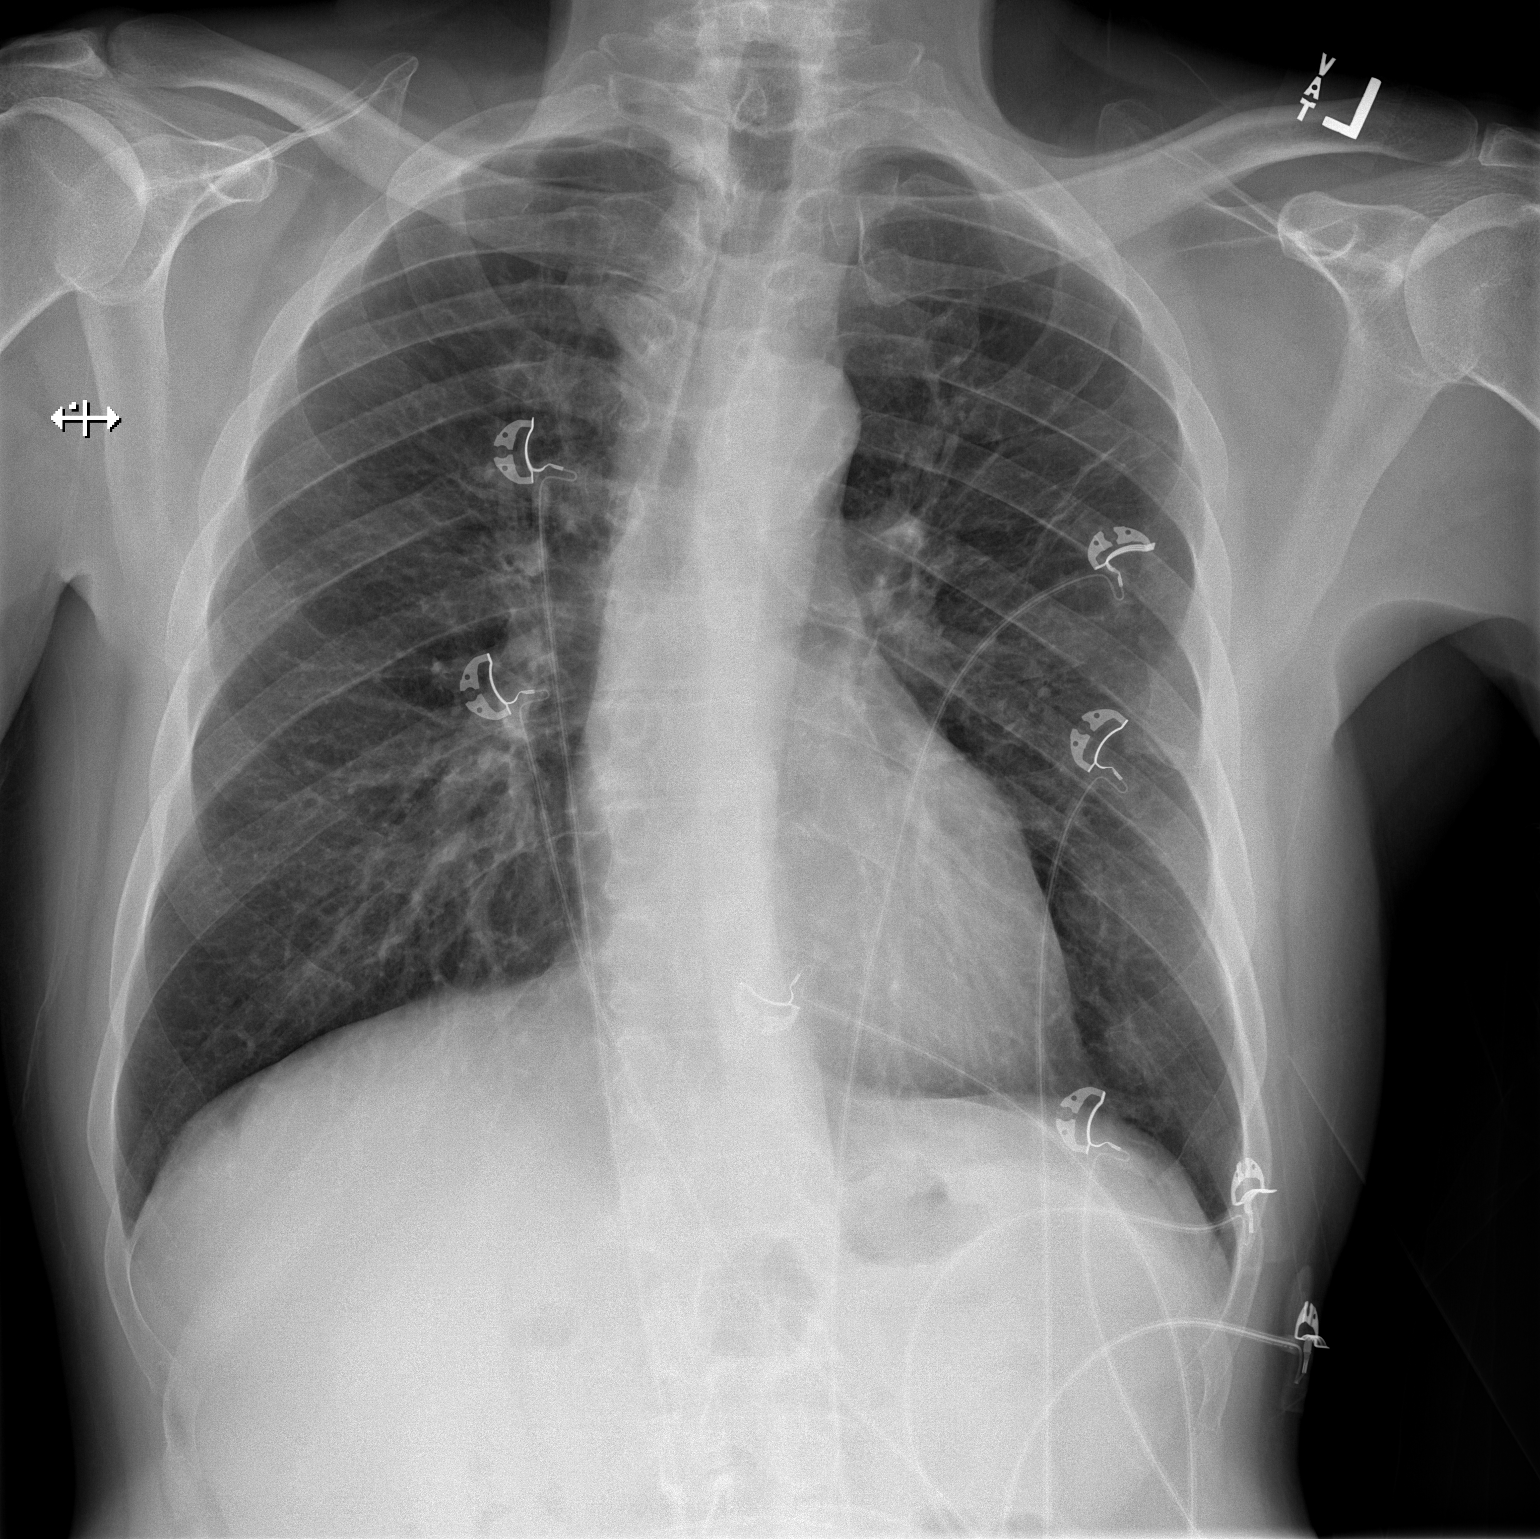

[w chest lat]
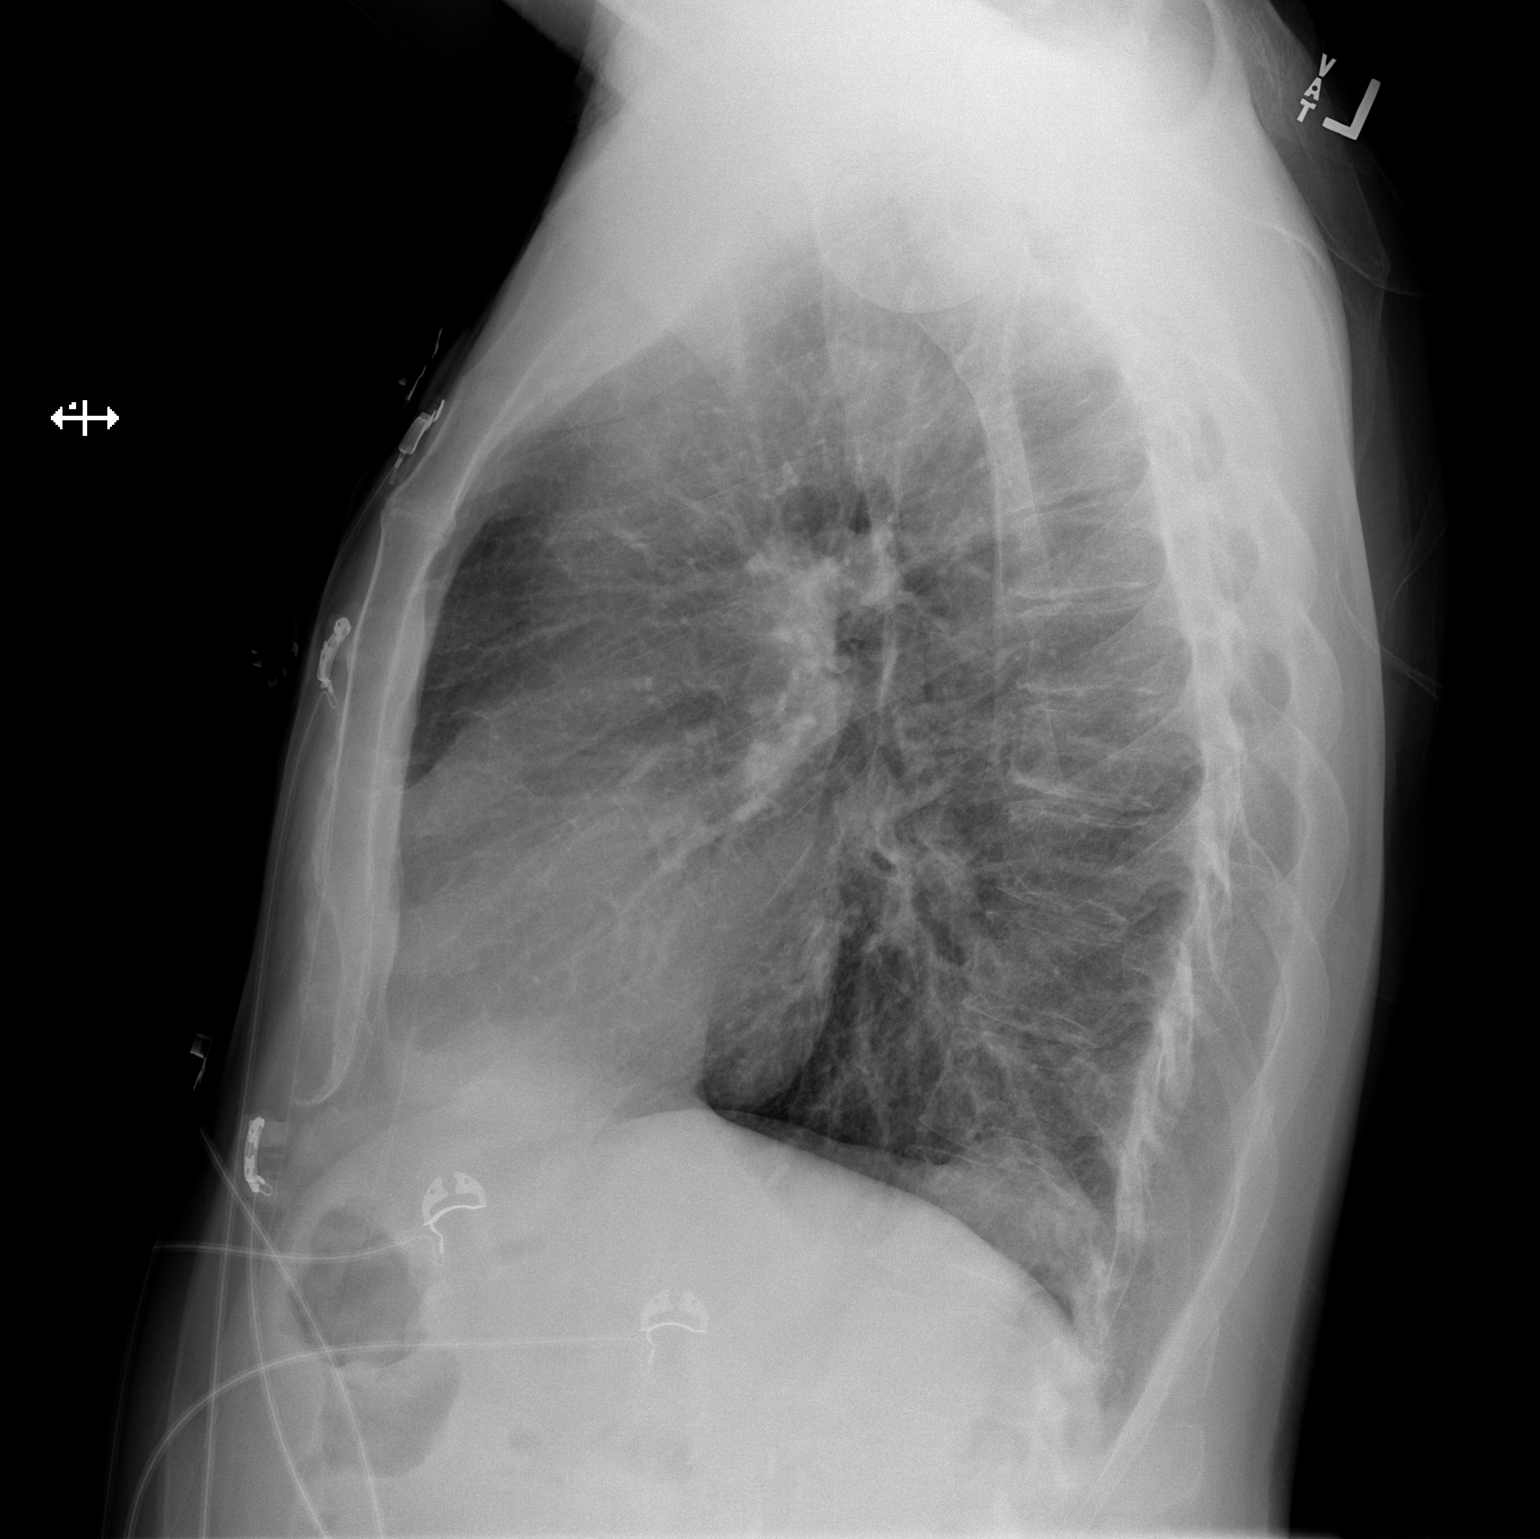

[2 of 2 positions shown; findings below may reference images not displayed]

FINDINGS: The heart size and mediastinal contours are within normal limits.
Both lungs are clear. Dextroscoliosis of the thoracic spine.
IMPRESSION: No acute abnormality of the lungs.

## 2020-04-12 MED ORDER — IOHEXOL 350 MG/ML SOLN
100.0000 mL | Freq: Once | INTRAVENOUS | Status: AC | PRN
  Administered 2020-04-12: 100 mL via INTRAVENOUS

## 2020-04-12 NOTE — ED Provider Notes (Signed)
6:19 PM BP 132/71   Pulse (!) 57   Temp 98.2 F (36.8 C) (Oral)   Resp 18   Ht 6\' 2"  (1.88 m)   Wt 77.6 kg   SpO2 98%   BMI 21.96 kg/m   Month long hx of left shoulder pain radiates to jaw. Patient will need f/u with Cardiology We need a dissection.  If negative and second troponin norma- OP follow up with [facility]([place])   Patient's repeat troponin negative.  I personally reviewed the patient's CT angiogram of the chest which shows no evidence of dissection.  Patient appears otherwise appropriate for discharge with his chronic chest pain he should follow-up with cardiology.  Patient appears safe for discharge at this time.  Discussed return precautions    , PA-C 04/12/20 2353    06/12/20, MD 04/12/20 2355

## 2020-04-12 NOTE — Discharge Instructions (Addendum)
Your lab work up and imaging were normal. You MUST FOLLOW UP  with outpatient cardiology. If you do not like your current cardiologist, you may follow up with a [facility]. Your caregiver has diagnosed you as having chest pain that is not specific for one problem, but does not require admission.  You are at low risk for an acute heart condition or other serious illness. Chest pain comes from many different causes.  SEEK IMMEDIATE MEDICAL ATTENTION IF: You have severe chest pain, especially if the pain is crushing or pressure-like and spreads to the arms, back, neck, or jaw, or if you have sweating, nausea (feeling sick to your stomach), or shortness of breath. THIS IS AN EMERGENCY. Don't wait to see if the pain will go away. Get medical help at once. Call 911 or [phone] (operator). DO NOT drive yourself to the hospital.  Your chest pain gets worse and does not go away with rest.  You have an attack of chest pain lasting longer than usual, despite rest and treatment with the medications your caregiver has prescribed.  You wake from sleep with chest pain or shortness of breath.  You feel dizzy or faint.  You have chest pain not typical of your usual pain for which you originally saw your caregiver.

## 2020-04-12 NOTE — ED Notes (Signed)
Patient transported to CT 

## 2020-04-12 NOTE — ED Triage Notes (Addendum)
Patient c/o left chest pain that radiates into the back and left arm x 1 month.patient also c/o dizziness.  Patient states that a piece of heavy equipment fell on his chest and has been having chest pain since November  Patient states that he went to a cardiologist last week and is suppose to be scheduled for additional testing, but has not heard from them as of yet.

## 2020-04-12 NOTE — ED Provider Notes (Signed)
[facility]-EMERGENCY DEPT Provider Note   CSN: [id number] Arrival date & time: 04/12/20  1413     History Chief Complaint  Patient presents with  . Chest Pain    RUSSEL MORAIN is a 60 y.o. male with past medical history significant for alcohol use disorder as well as PE and DVT on Warfarin who presents to the ED with a 107-month history of chest pain radiating to his left arm and upper back.  I reviewed patient's medical record and he was evaluated on 03/12/2020 for same complaints.  CT angio chest was negative for acute pulmonary embolus at the time.  EKG and troponins were obtained and reassuring.  His work-up was unremarkable and plan at discharge was for close outpatient cardiology follow-up.    Patient was then seen by Dr. Sharrell Ku with [facility] at [facility] who recommended CTA coronaries and he was agreeable.  He did not wish to begin treatments until CTA is conducted.  Declined Zio patch to better assess his reportedly longstanding bradycardia.  They also noted recent syncope concerning for cardiac etiology.    On my examination, patient reports that 7 days ago he woke up and felt extremely tired and weak.  He states that he has had left-sided chest pain described as "sharp" that radiates to his left jaw and left scapular region as well as left arm paresthesias.  He states that he is very lightheaded and fatigued.  Patient states that he has been taking his warfarin, as directed.  He has not been smoking ever since his initial onset of chest discomfort a few months ago.  He understands that he does have significant risk factors and is concerned that he is having a heart attack.  While he reported for similar symptoms last month, he states that now they are constant and his chest pain is worse.  He denies any exertional component, abdominal pain, nausea or emesis, blurred vision or room spinning dizziness, cough or hemoptysis, unilateral extremity swelling, or other  symptoms.  HPI    Past Medical History:  Diagnosis Date  . Allergy   . Anxiety   . Clotting disorder (HCC)    Unsure if has ever been worked up, per pet has had 9 clots in the past, last was in 2006, both brothers also have clotting issues  . Depression   . DVT (deep venous thrombosis) [facility])     Patient Active Problem List   Diagnosis Date Noted  . Clotting disorder (HCC) 01/21/2017  . Encounter for therapeutic drug monitoring 04/07/2016  . Alcohol abuse, daily use 12/20/2015  . LPRD (laryngopharyngeal reflux disease) 12/20/2015  . DVT (deep venous thrombosis) (HCC) 11/23/2013    Past Surgical History:  Procedure Laterality Date  . dental implants     x8       Family History  Problem Relation Age of Onset  . Heart attack Father   . Clotting disorder Brother   . Clotting disorder Brother     Social History   Tobacco Use  . Smoking status: Former Smoker    Packs/day: 1.00    Types: E-cigarettes, Cigarettes    Quit date: 01/14/2017    Years since quitting: 3.2  . Smokeless tobacco: Never Used  Vaping Use  . Vaping Use: Never used  Substance Use Topics  . Alcohol use: Yes    Comment: daily 4 beers   . Drug use: No    Home Medications Prior to Admission medications   Medication Sig  Start Date End Date Taking? Authorizing Provider  OVER THE COUNTER MEDICATION Take 1 capsule by mouth as directed. Royal maca    [provider]  warfarin (COUMADIN) 2 MG tablet Take 1 tablet (2mg  total) along with (5 mg) by mouth Tuesday,Thursday, Saturday, and Sunday 02/09/19   04/09/19, PA-C  warfarin (COUMADIN) 5 MG tablet Take 2 tablets (10 mg total) by mouth Monday, Wednesday, Friday. Take 1 tablet (5 mg total) by mouth Tuesday,Thursday, Saturday, and Sunday along with (2 mg) 02/09/19   04/09/19, PA-C    Allergies    Codeine, Penicillins, and Sulfa antibiotics  Review of Systems   Review of Systems  All other systems reviewed and are  negative.   Physical Exam Updated Vital Signs BP 132/71   Pulse (!) 57   Temp 98.2 F (36.8 C) (Oral)   Resp 18   Ht 6\' 2"  (1.88 m)   Wt 77.6 kg   SpO2 98%   BMI 21.96 kg/m   Physical Exam Vitals and nursing note reviewed. Exam conducted with a chaperone present.  Constitutional:      General: He is not in acute distress.    Appearance: Normal appearance. He is not ill-appearing.  HENT:     Head: Normocephalic and atraumatic.  Eyes:     General: No scleral icterus.    Conjunctiva/sclera: Conjunctivae normal.  Cardiovascular:     Rate and Rhythm: Regular rhythm. Bradycardia present.     Pulses: Normal pulses.     Heart sounds: Normal heart sounds.  Pulmonary:     Effort: Pulmonary effort is normal. No respiratory distress.     Breath sounds: Normal breath sounds. No wheezing or rales.  Abdominal:     General: Abdomen is flat. There is no distension.     Palpations: Abdomen is soft.     Tenderness: There is no abdominal tenderness. There is no guarding.     Comments: Soft, nontender.  Specifically, no TTP in right upper quadrant or epigastrium.  Musculoskeletal:        General: Normal range of motion.     Cervical back: Normal range of motion.  Skin:    General: Skin is dry.  Neurological:     Mental Status: He is alert and oriented to person, place, and time.     GCS: GCS eye subscore is 4. GCS verbal subscore is 5. GCS motor subscore is 6.  Psychiatric:        Mood and Affect: Mood normal.        Behavior: Behavior normal.        Thought Content: Thought content normal.     ED Results / Procedures / Treatments   Labs (all labs ordered are listed, but only abnormal results are displayed) Labs Reviewed  PROTIME-INR - Abnormal; Notable for the following components:      Result Value   Prothrombin Time 25.7 (*)    INR 2.4 (*)    All other components within normal limits  CBC WITH DIFFERENTIAL/PLATELET  LIPASE, BLOOD  COMPREHENSIVE METABOLIC PANEL  TROPONIN  I (HIGH SENSITIVITY)  TROPONIN I (HIGH SENSITIVITY)    EKG EKG Interpretation  Date/Time:  Friday April 12 2020 15:47:29 EST Ventricular Rate:  49 PR Interval:    QRS Duration: 84 QT Interval:  443 QTC Calculation: 400 R Axis:   53 Text Interpretation: Sinus bradycardia Abnormal R-wave progression, early transition No significant change since last tracing Confirmed by Wednesday [phone]) on 04/12/2020 3:53:00  PM   Radiology DG Chest 2 View  Result Date: 04/12/2020 CLINICAL DATA:  Chest pain EXAM: CHEST - 2 VIEW COMPARISON:  03/12/2020 FINDINGS: The heart size and mediastinal contours are within normal limits. Both lungs are clear. Dextroscoliosis of the thoracic spine. IMPRESSION: No acute abnormality of the lungs. Electronically Signed   By: Lauralyn Primes M.D.   On: 04/12/2020 15:10    Procedures Procedures   Medications Ordered in ED Medications - No data to display  ED Course  I have reviewed the triage vital signs and the nursing notes.  Pertinent labs & imaging results that were available during my care of the patient were reviewed by me and considered in my medical decision making (see chart for details).    MDM Rules/Calculators/A&P HEAR Score: 3                        CRISTOPHER CICCARELLI was evaluated in Emergency Department on 04/12/2020 for the symptoms described in the history of present illness. He was evaluated in the context of the global COVID-19 pandemic, which necessitated consideration that the patient might be at risk for infection with the SARS-CoV-2 virus that causes COVID-19. Institutional protocols and algorithms that pertain to the evaluation of patients at risk for COVID-19 are in a state of rapid change based on information released by regulatory bodies including the CDC and federal and state organizations. These policies and algorithms were followed during the patient's care in the ED.  I personally reviewed patient's medical chart and all notes from triage and  staff during today's encounter. I have also ordered and reviewed all labs and imaging that I felt to be medically necessary in the evaluation of this patient's complaints and with consideration of their physical exam. If needed, translation services were available and utilized.   There is also a telephone encounter documented yesterday in which echocardiogram is scheduled for 05/06/2020 at 2 PM.  CTA was canceled given CTA PE study obtained 03/12/2020.  However, Dr. Sharrell Ku recognized the recent CTA PE and was recommending CTA of the coronaries.   Chest x-ray obtained here in the ED was personally reviewed and without any acute cardiopulmonary disease.  Laboratory work-up is also reviewed and unremarkable.  Initial troponin within normal limits at 5.  He describes the chest pain is constant for the past week.  No obvious aggravating or alleviating factors.  EKG with sinus bradycardia and abnormal R wave progression, but relatively unchanged when compared to prior tracings.  I discussed case with Dr. Derwood Kaplan who personally evaluated patient.  We will proceed with CTA dissection study.  If negative, and second troponin is negative, patient can be referred to [facility] (recommend urgent ambulatory referral) preferably for [place], [place].  At shift change care was transferred to Arthor Captain, PA-C who will follow pending studies, re-evaluate, and determine disposition.    The patient was counseled on the dangers of tobacco use, and was advised to quit.  Reviewed strategies to maximize success, including removing cigarettes and smoking materials from environment, stress management, substitution of other forms of reinforcement, support of family/friends and written materials. Total time was 5 min CPT code 09326.    Final Clinical Impression(s) / ED Diagnoses Final diagnoses:  Nonspecific chest pain    Rx / DC Orders ED Discharge Orders    None       Lorelee New, PA-C 04/12/20  Janett Labella, MD 04/12/20 1949

## 2020-08-12 ENCOUNTER — Telehealth: Payer: Self-pay | Admitting: Cardiology

## 2020-08-12 NOTE — Telephone Encounter (Signed)
Spoke with the patient who once again is refusing to go to the ER.  Patient is currently scheduled to see Eula Listen, PA in [place] on 7/14. He states that he received a message from their office a little bit ago in regards to a sooner appointment. I advised him to call them back before 5 today so he could be seen sooner. Patient verbalized understanding.

## 2020-08-12 NOTE — Telephone Encounter (Signed)
New message    Pt c/o of Chest Pain: STAT if CP now or developed within 24 hours  1. Are you having CP right now? yes  2. Are you experiencing any other symptoms (ex. SOB, nausea, vomiting, sweating)? Pain into the left side of neck and chest, numbness that comes and goes   3. How long have you been experiencing CP? Couple weeks and it keeps getting worse   4. Is your CP continuous or coming and going? Comes and goes   5. Have you taken Nitroglycerin?  no ?

## 2020-08-12 NOTE — Telephone Encounter (Signed)
Spoke with pt in regards to CP.  I noted in chart that he was last seen by [facility] 04/2020.  He reports that he no longer wants to see this practice.  He reports intermittent CP since Feb. 2022.  He had an accident in Nov. 2021 and a piece of equipment crushed his chest.  He initially felt that CP was coming from this injury.  CP is on left side of chest radiating up to left side of jaw and down left arm.  He denies N/V, diaphoresis.  He does feel dizzy when he has CP.  He feels exhausted all the time and gets winded with minor activity.  I advised him to call 911 to be evaluated.  He firmly replied that he is not going to the ED because they are not going to do anything.  Per pt he has been to the ED twice with no help.  He reports that he called the [facility] office and they offered him an appointment in August.  I then advised him to go to [facility] ED for evaluation.  He reports that he will call [place] office to accept appointment.  Will route to Armanda Magic and RN.

## 2020-08-13 ENCOUNTER — Ambulatory Visit (INDEPENDENT_AMBULATORY_CARE_PROVIDER_SITE_OTHER): Payer: Self-pay | Admitting: Family Medicine

## 2020-08-13 ENCOUNTER — Encounter: Payer: Self-pay | Admitting: Family Medicine

## 2020-08-13 VITALS — BP 114/62 | HR 42 | Ht 74.0 in | Wt 172.4 lb

## 2020-08-13 DIAGNOSIS — R55 Syncope and collapse: Secondary | ICD-10-CM

## 2020-08-13 DIAGNOSIS — R0602 Shortness of breath: Secondary | ICD-10-CM

## 2020-08-13 DIAGNOSIS — R079 Chest pain, unspecified: Secondary | ICD-10-CM

## 2020-08-13 DIAGNOSIS — R002 Palpitations: Secondary | ICD-10-CM

## 2020-08-13 NOTE — Patient Instructions (Signed)
Medication Instructions:  Continue all current medications.  Labwork: none  Testing/Procedures: Your physician has requested that you have an echocardiogram. Echocardiography is a painless test that uses sound waves to create images of your heart. It provides your doctor with information about the size and shape of your heart and how well your heart's chambers and valves are working. This procedure takes approximately one hour. There are no restrictions for this procedure. Office will contact with results via phone or letter.    Follow-Up: 6 weeks   Any Other Special Instructions Will Be Listed Below (If Applicable).  If you need a refill on your cardiac medications before your next appointment, please call your pharmacy.  

## 2020-08-13 NOTE — Progress Notes (Signed)
Cardiology Office Note  Date: 08/13/2020   ID: RYVER POBLETE, DOB 06/29/1960, MRN [id number]  PCP:  Pcp, No  Cardiologist:  None Electrophysiologist:  None   Chief Complaint: Chest pain  History of Present Illness: Craig Hahn is a 60 y.o. male with a history of palpitations, chest pain of uncertain etiology, tobacco abuse, family history of early CAD, history of clotting disorder with DVT in the past.  He last saw Dr. Mayford Knife via telemedicine on 08/11/2018 for complaint of chest pain.  He had been recently seen by his PCP for chest pain.  He had apparently seen his PCP in August 2020 with chest pain that had not been ongoing for a month.  He denied any associated shortness of breath, nausea or radiation.  Stated he had broken out into a sweat in the a.m. when he got up.  He had been awakening with night sweats also.  Also complaining of bilateral arm numbness when awakening.Stated he had decreased appetite.  Dr. Mayford Knife stated he had cardiac risk factors including ongoing tobacco use, family history of CAD.  EKG was nonischemic per her statement.  She recommended he undergo a Scientist, physiological.  He stated he was self-pay and out of work and could not afford any studies.  Plan was to hold off on the echo and find out how much stress test would cost.  He apparently also described a sensation of buzzing being under his shirt.  This had been ongoing for months.  He was not sure if it was really palpitations.  He stated he could not afford an event monitor at that time.  Relative to history of clotting disorder,  the last time he had any problems per her statement was 2006.   On 03/12/2020 he presented to [facility] emergency department endorsing a syncopal episode 2 weeks prior with chest pain.  He was seen at a local urgent care the prior week and again the day before presentation.Marland Kitchen  He also endorsed complaint of intermittent chest pain x 2 weeks.  His D-dimer was 1.03.  He had been off his warfarin for  1 year.  He had been taking warfarin for history of recurrent DVTs and PE.  He stated he had started back on Coumadin the day before presentation.  CTA chest was negative for acute PE.  EKG showed bradycardia without acute ischemic changes.  Troponins were reassuring.  He was discharged  He presented to [facility] emergency department on 04/12/2020 with a 1 month history of chest pain radiating to left arm and upper back.  Provider notes stated he had been seen by Dr. Sharrell Ku with [facility] cardiology in [place] who recommended CT of the coronary arteries and he was agreeable.  He did not wish to begin treatment until CTA was conducted.  Patient declined a Zio patch to better assess his longstanding bradycardia.  They also noted recent syncope concerning for cardiac etiology.  On ER provider's exam he states patient reported 7 days prior he woke up and felt extremely tired and weak.  He had left-sided chest pain described as sharp, radiating his left jaw and left scapular region with left arm paresthesias.  Stated he was lightheaded and fatigued.  He had chest x-ray in ED and there were no acute abnormalities.  Lab work was reviewed and unremarkable initial troponin negative.  There was a recent phone encounter to office on July 11 with patient complaints of chest pain.  Described as  pain into the left-side of chest, neck, and numbness that comes and goes.  Experiencing it a couple of times a week and keeps getting worse.  He had not taken any nitroglycerin.  Personnel spoke to the patient and noted he had last been seen by [facility] cardiology on March 2022 and no longer wanted to see this practice.  Stated he had been experiencing intermittent chest pain since February 2022.  He had an accident in November 2021 and a small piece of equipment  fell across his chest.  He initially felt that the pain had been coming and going from this injury.  States chest pain was on the left side of chest radiating up to  left-sided jaw and down left arm.  He denied any nausea vomiting or diaphoresis.  Stated he felt dizzy when he had the chest pain.  He felt exhausted all the time and gets winded with moderate activity.  He was advised to call 911 to be evaluated.  He firmly replied that he was not going to the ED because they were not going to do anything.  Per the patient he had been to the ED twice with no help.  He reported that he called [place] office and they offered him an appointment in August.  He was then advised to go to [facility] for evaluation.  Personnel talked with Dr. Carolanne Grumblingracy Turner cardiology who advised that he needed to go to the emergency room.  He had then apparently decided to refuse to go to ED.  Dr. Mayford Knifeurner noted he had not been seen in over 2 years and needed to go to urgent care or ER and if he refused would try to get him in with DOD at [facility] the next day.  She also mentioned  shecould try to get [facility] in at [facility].  Personnel spoke with the patient who was refusing to go to ER.  He was scheduled to see Eula Listenyan Dunn, PA in [place] on 08/15/2020 and advised patient could have a sooner appointment.  He was advised to call back before 5 that day so he could be seen sooner.  Patient apparently verbalized understanding.  Patient comes with multiple complaints with history of syncopal episodes, palpitations, chest pain/chest tightness and DOE/SOB associated with and without exertion.  History of previous smoking.  Denies any current smoking.  States he is tired and stressed out with significant fatigue.  Denies any CVA or TIA-like symptoms.  Denies any PND or orthopnea.  Denies any bleeding issues.  He denies any claudication-like symptoms, DVT or PE-like symptoms or lower extremity edema.  We discussed possible cardiac monitor for evaluation of palpitations/syncopal episodes.  We discussed possible stress test for evaluation of chest pain.  We discussed echocardiogram to evaluate source of shortness of  breath and/or possible syncopal episodes.  He states he is self-pay and does not have the finances to afford all of these tests.  He finally agreed to undergo an echocardiogram.  I discussed we could possibly discern if he was having any arrhythmias during the echocardiogram in addition to evaluating the pumping function of his heart including possibility of valvular abnormalities or having a stiff noncompliant heart or decreased pumping function.  We discussed possible valvular stenosis in particular aortic stenosis which could cause shortness of breath/dyspnea/near syncopal or syncopal episodes and/or anginal symptoms.  It appears he is agreeable to undergo an echocardiogram.   Past Medical History:  Diagnosis Date   Allergy    Anxiety  Clotting disorder (HCC)    Unsure if has ever been worked up, per pet has had 9 clots in the past, last was in 2006, both brothers also have clotting issues   Depression    DVT (deep venous thrombosis) (HCC)     Past Surgical History:  Procedure Laterality Date   dental implants     x8    Current Outpatient Medications  Medication Sig Dispense Refill   MISC NATURAL PRODUCTS PO Take 3 capsules by mouth 2 (two) times daily. Serrapeptase & Nattokinase     No current facility-administered medications for this visit.   Allergies:  Codeine, Penicillins, and Sulfa antibiotics   Social History: The patient  reports that he quit smoking about 3 years ago. His smoking use included e-cigarettes and cigarettes. He smoked an average of 1.00 packs per day. He has never used smokeless tobacco. He reports current alcohol use. He reports that he does not use drugs.   Family History: The patient's family history includes Clotting disorder in his brother and brother; Heart attack in his father.   ROS:  Please see the history of present illness. Otherwise, complete review of systems is positive for none.  All other systems are reviewed and negative.   Physical  Exam: VS:  BP 114/62   Pulse (!) 42   Ht 6\' 2"  (1.88 m)   Wt 172 lb 6.4 oz (78.2 kg)   SpO2 95%   BMI 22.13 kg/m , BMI Body mass index is 22.13 kg/m.  Wt Readings from Last 3 Encounters:  08/13/20 172 lb 6.4 oz (78.2 kg)  04/12/20 171 lb (77.6 kg)  03/12/20 160 lb (72.6 kg)    General: Patient appears comfortable at rest. Neck: Supple, no elevated JVP or carotid bruits, no thyromegaly. Lungs: Clear to auscultation, nonlabored breathing at rest. Cardiac: Regular rate and rhythm, no S3 or significant systolic murmur, no pericardial rub. Extremities: No pitting edema, distal pulses 2+. Skin: Warm and dry. Musculoskeletal: No kyphosis. Neuropsychiatric: Alert and oriented x3, affect grossly appropriate.  ECG:  EKG 04/12/2020 sinus rhythm rate of 68, anteroseptal infarct old, bilateral T wave abnormality in inferior leads  Recent Labwork: 04/12/2020: ALT 14; AST 21; BUN 13; Creatinine, Ser 0.66; Hemoglobin 16.0; Platelets 199; Potassium 4.3; Sodium 139     Component Value Date/Time   CHOL 180 01/21/2017 0937   TRIG 63.0 01/21/2017 0937   HDL 65.80 01/21/2017 0937   CHOLHDL 3 01/21/2017 0937   VLDL 12.6 01/21/2017 0937   LDLCALC 102 (H) 01/21/2017 0937    Other Studies Reviewed Today:   Assessment and Plan:  1. SOB (shortness of breath)   2. Syncope, unspecified syncope type   3. Chest pain of uncertain etiology   4. Palpitations    1. SOB (shortness of breath) History of chronic dyspnea. No evidence on exam of respiratory distress. Lungs CTA. He does have previous history of smoking. States increased DOE on activity. Please get echocardiogram to assess LV function, diastolic function, valvular function  2. Syncope, unspecified syncope type States history of multiple syncopal episodes. Has been offered cardiac event monitors in the past due to cost and affordability issues. He states he does not have insurance. He is currently refusing cardiac monitor.   3. Chest pain of  uncertain etiology History of multiple episodes of chest pain with radiation to neck arms, back. No associated N/V or diaphoresis. Currently refuses offer of Nuclear stress test due to cost and affordability issues.   4. Palpitations History  sensation of palpitations in the past and "buzzing" under his shirt per Dr Mayford Knife in the past. He refuses cardiac monitor to assess for cardiac arrhythmias due to cost and affordability issues.   Medication Adjustments/Labs and Tests Ordered: Current medicines are reviewed at length with the patient today.  Concerns regarding medicines are outlined above.   Disposition: Follow-up with Dr Wyline Mood or APP 6-8 weeks  Signed, Rennis Harding, NP 08/13/2020 3:45 PM    [facility] at [facility] [address] [place], [place], [place] [address] Phone: [phone]; Fax: [phone]

## 2020-08-14 ENCOUNTER — Encounter: Payer: Self-pay | Admitting: Family Medicine

## 2020-08-15 ENCOUNTER — Ambulatory Visit: Payer: Self-pay | Admitting: Physician Assistant

## 2020-08-20 ENCOUNTER — Ambulatory Visit (HOSPITAL_COMMUNITY)
Admission: RE | Admit: 2020-08-20 | Discharge: 2020-08-20 | Disposition: A | Discharge status: Home / Self Care | Payer: Self-pay | Source: Ambulatory Visit | Attending: Family Medicine | Admitting: Family Medicine

## 2020-08-20 ENCOUNTER — Other Ambulatory Visit: Payer: Self-pay

## 2020-08-20 DIAGNOSIS — R0602 Shortness of breath: Secondary | ICD-10-CM | POA: Insufficient documentation

## 2020-08-20 LAB — ECHOCARDIOGRAM COMPLETE
Area-P 1/2: 2.46 cm2
S' Lateral: 2.9 cm

## 2020-08-20 NOTE — Progress Notes (Signed)
*  PRELIMINARY RESULTS* Echocardiogram 2D Echocardiogram has been performed.  Stacey Drain 08/20/2020, 11:58 AM

## 2020-08-21 ENCOUNTER — Telehealth: Payer: Self-pay

## 2020-08-21 NOTE — Telephone Encounter (Signed)
Pt was notified and voiced understanding.

## 2020-08-21 NOTE — Telephone Encounter (Signed)
-----   Message from Netta Neat., NP sent at 08/21/2020  7:49 AM EDT ----- Please call the patient and let him know the echocardiogram shows good pumping function of the heart. He has very slight leak in the mitral valve on the left side of his heart Nothing evident on the study to explain SOB, Syncope.  No walll motion abnormalities to suggest ischemia. We previously offered to get a cardiac monitor and stress test. However, he did no wish to undergo these tests due to cost and affordability issues.   Netta Neat, NP  08/21/2020 7:48 AM

## 2020-08-26 ENCOUNTER — Telehealth: Payer: Self-pay | Admitting: Family Medicine

## 2020-08-26 DIAGNOSIS — R079 Chest pain, unspecified: Secondary | ICD-10-CM

## 2020-08-26 MED ORDER — NITROGLYCERIN 0.4 MG SL SUBL: 0.4000 mg | SUBLINGUAL_TABLET | SUBLINGUAL | 3 refills | Status: AC | PRN

## 2020-08-26 NOTE — Telephone Encounter (Signed)
-----   Message from Netta Neat., NP sent at 08/21/2020  6:50 PM EDT ----- We can start him on NTG SL 00.4 mg PRN if he is agreeable. He did not want to pursue a stress test or cardiac monitor for his CP and palpitations due to cost issues. If he has changed his mind about those tests we can order them. Thanks ----- Message ----- From: Roseanne Reno, CMA Sent: 08/21/2020  11:29 AM EDT To: Netta Neat., NP  Pt was notified and voiced understanding. Pt is c/o fatigue and CP and want to know if there is something else that can be done.

## 2020-08-26 NOTE — Telephone Encounter (Signed)
New message    Patient calling to speak with Morrie Sheldon , did not specify what it was regarding

## 2020-08-26 NOTE — Telephone Encounter (Signed)
Pt accepted SL NTG and Lexiscan, but denied cardiac monitor

## 2020-09-03 ENCOUNTER — Other Ambulatory Visit (HOSPITAL_COMMUNITY): Payer: Self-pay | Admitting: Internal Medicine

## 2020-09-24 ENCOUNTER — Ambulatory Visit: Payer: Self-pay | Admitting: Family Medicine

## 2020-12-24 ENCOUNTER — Other Ambulatory Visit: Payer: Self-pay

## 2020-12-24 ENCOUNTER — Emergency Department (HOSPITAL_COMMUNITY): Payer: Self-pay

## 2020-12-24 ENCOUNTER — Emergency Department (HOSPITAL_COMMUNITY)
Admission: EM | Admit: 2020-12-24 | Discharge: 2020-12-25 | Disposition: A | Discharge status: Home / Self Care | Payer: Self-pay | Attending: Emergency Medicine | Admitting: Emergency Medicine

## 2020-12-24 DIAGNOSIS — R001 Bradycardia, unspecified: Secondary | ICD-10-CM | POA: Insufficient documentation

## 2020-12-24 DIAGNOSIS — Z87891 Personal history of nicotine dependence: Secondary | ICD-10-CM | POA: Insufficient documentation

## 2020-12-24 DIAGNOSIS — M79605 Pain in left leg: Secondary | ICD-10-CM | POA: Insufficient documentation

## 2020-12-24 LAB — CBC WITH DIFFERENTIAL/PLATELET
Abs Immature Granulocytes: 0.01 10*3/uL (ref 0.00–0.07)
Basophils Absolute: 0 10*3/uL (ref 0.0–0.1)
Basophils Relative: 1 %
Eosinophils Absolute: 0.1 10*3/uL (ref 0.0–0.5)
Eosinophils Relative: 2 %
HCT: 49.3 % (ref 39.0–52.0)
Hemoglobin: 16.3 g/dL (ref 13.0–17.0)
Immature Granulocytes: 0 %
Lymphocytes Relative: 32 %
Lymphs Abs: 1.9 10*3/uL (ref 0.7–4.0)
MCH: 33.5 pg (ref 26.0–34.0)
MCHC: 33.1 g/dL (ref 30.0–36.0)
MCV: 101.2 fL — ABNORMAL HIGH (ref 80.0–100.0)
Monocytes Absolute: 0.6 10*3/uL (ref 0.1–1.0)
Monocytes Relative: 11 %
Neutro Abs: 3.2 10*3/uL (ref 1.7–7.7)
Neutrophils Relative %: 54 %
Platelets: 185 10*3/uL (ref 150–400)
RBC: 4.87 MIL/uL (ref 4.22–5.81)
RDW: 13.3 % (ref 11.5–15.5)
WBC: 5.9 10*3/uL (ref 4.0–10.5)
nRBC: 0 % (ref 0.0–0.2)

## 2020-12-24 LAB — BASIC METABOLIC PANEL
Anion gap: 5 (ref 5–15)
BUN: 20 mg/dL (ref 8–23)
CO2: 28 mmol/L (ref 22–32)
Calcium: 8.9 mg/dL (ref 8.9–10.3)
Chloride: 103 mmol/L (ref 98–111)
Creatinine, Ser: 0.81 mg/dL (ref 0.61–1.24)
GFR, Estimated: >60 mL/min (ref >60)
Glucose, Bld: 82 mg/dL (ref 70–99)
Potassium: 4.2 mmol/L (ref 3.5–5.1)
Sodium: 136 mmol/L (ref 135–145)

## 2020-12-24 LAB — D-DIMER, QUANTITATIVE: D-Dimer, Quant: 1.54 ug/mL-FEU — ABNORMAL HIGH (ref 0.00–0.50)

## 2020-12-24 IMAGING — DX DG ANKLE 2V *L*
2 series · 2 of 2 positions shown · non-contrast
Comparison: None.

CLINICAL DATA: Redness and swelling to left internal ankle. No
known injury.

EXAM:
LEFT ANKLE - 2 VIEW

[ankle ap]
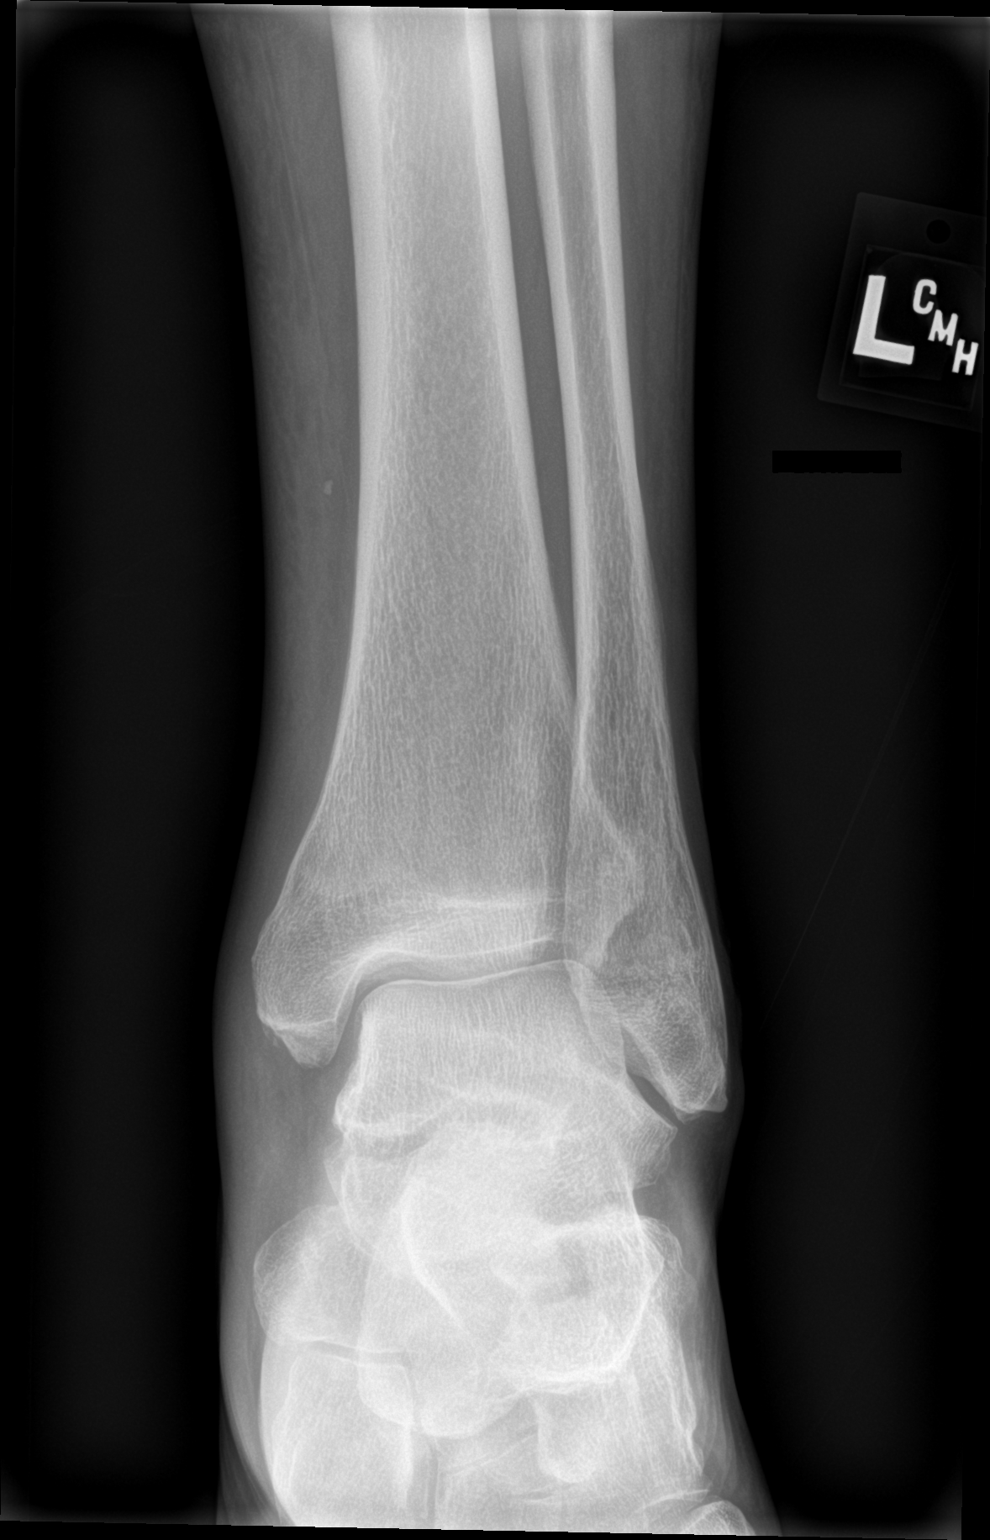

[ankle lat]
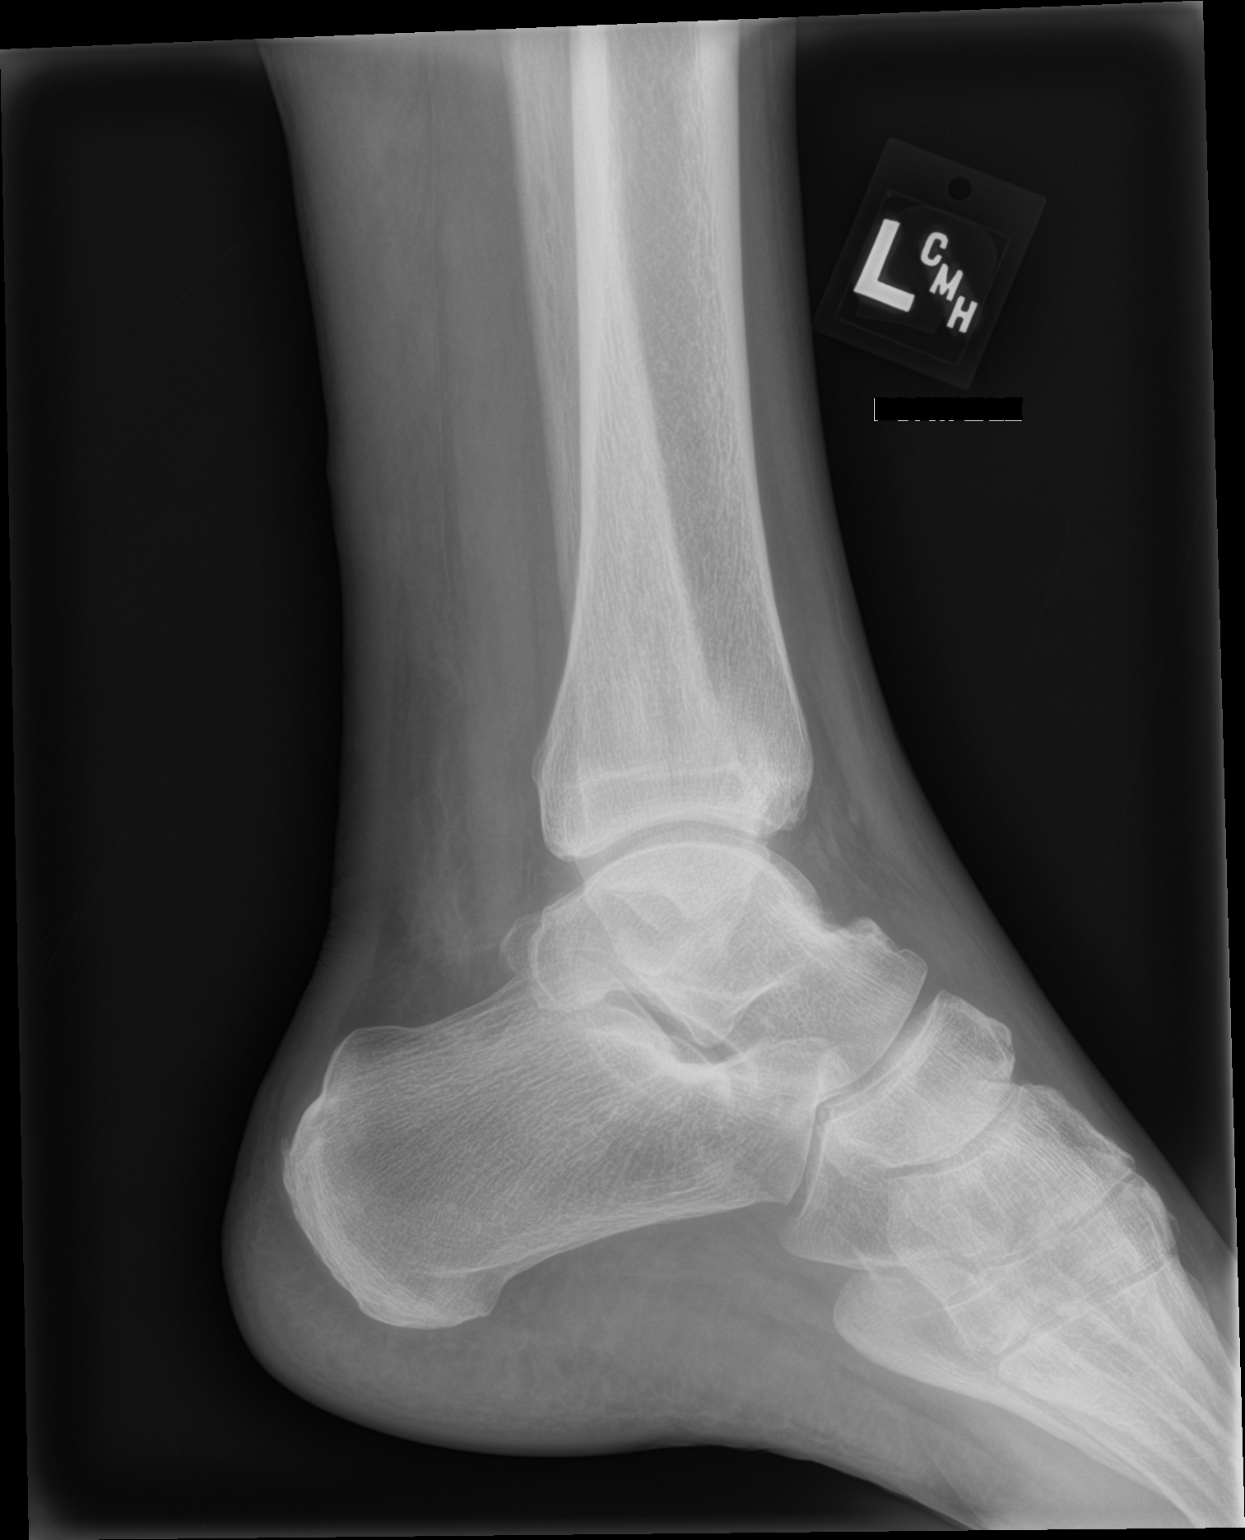

[2 of 2 positions shown; findings below may reference images not displayed]

FINDINGS: There is no evidence of fracture, dislocation, or joint effusion. No
erosion or periosteal reaction. Ankle mortise is preserved. There is
no evidence of arthropathy or other focal bone abnormality. Mild
medial soft tissue edema. No soft tissue calcifications, air, or
radiopaque foreign body.
IMPRESSION: Mild medial soft tissue edema. No osseous abnormality.

## 2020-12-24 MED ORDER — APIXABAN 5 MG PO TABS
10.0000 mg | ORAL_TABLET | Freq: Once | ORAL | Status: AC
  Administered 2020-12-24: 10 mg via ORAL
  Filled 2020-12-24: qty 2

## 2020-12-24 NOTE — Discharge Instructions (Signed)
Return to [facility] tomorrow for DVT ultrasound study. Return to ED if you were to fall and hit your head or be involved in a motor vehicle accident. Based on the medication you received before discharge, you are at increased risk for bleeding.  Please take caution with all things that you do between tonight and tomorrow when you return for your DVT ultrasound.

## 2020-12-24 NOTE — ED Provider Notes (Signed)
[facility] EMERGENCY DEPARTMENT Provider Note   CSN: [id number] Arrival date & time: 12/24/20  1557     History Chief Complaint  Patient presents with   Leg Pain    Craig Hahn is a 60 y.o. 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  States that for 5 days ago began having pain down into his left leg consistent with past DVTs.  Patient states that he cannot stand on this leg pain.  Patient states that aggravating factors include standing and relieving factors include sleep.  Patient states that his last blood clot was 6 years ago, and that he is not on any anticoagulation therapy as he stopped taking it 2 years ago for unknown reasons. Patient continues that he has been taking a natural supplement which she refers to as nattokinase.  Patient denies any history of cancer, any recent immobilization, fevers, nausea, vomiting.  Patient does endorse calf swelling, appearance of varicose veins, edema, and tenderness to left lower extremity.   Leg Pain Associated symptoms: no fever       Past Medical History:  Diagnosis Date   Allergy    Anxiety    Clotting disorder (HCC)    Unsure if has ever been worked up, per pet has had 9 clots in the past, last was in 2006, both brothers also have clotting issues   Depression    DVT (deep venous thrombosis) [facility])     Patient Active Problem List   Diagnosis Date Noted   Clotting disorder (HCC) 01/21/2017   Encounter for therapeutic drug monitoring 04/07/2016   Alcohol abuse, daily use 12/20/2015   LPRD (laryngopharyngeal reflux disease) 12/20/2015   DVT (deep venous thrombosis) (HCC) 11/23/2013    Past Surgical History:  Procedure Laterality Date   dental implants     x8       Family History  Problem Relation Age of Onset   Heart attack Father    Clotting disorder Brother    Clotting disorder Brother     Social History   Tobacco Use   Smoking status: Former    Packs/day: 1.00    Types:  E-cigarettes, Cigarettes    Quit date: 01/14/2017    Years since quitting: 3.9   Smokeless tobacco: Never  Vaping Use   Vaping Use: Never used  Substance Use Topics   Alcohol use: Yes    Comment: daily 4 beers    Drug use: No    Home Medications Prior to Admission medications   Medication Sig Start Date End Date Taking? Authorizing Provider  MISC NATURAL PRODUCTS PO Take 3 capsules by mouth 2 (two) times daily. Serrapeptase & Nattokinase    [provider]  nitroGLYCERIN (NITROSTAT) 0.4 MG SL tablet Place 1 tablet (0.4 mg total) under the tongue every 5 (five) minutes as needed for chest pain. 08/26/20 11/24/20  Netta Neat., NP    Allergies    Codeine, Penicillins, and Sulfa antibiotics  Review of Systems   Review of Systems  Constitutional:  Negative for chills and fever.  Gastrointestinal:  Negative for abdominal pain, diarrhea, nausea and vomiting.  Skin:  Positive for color change.  All other systems reviewed and are negative.  Physical Exam Updated Vital Signs BP 127/81   Pulse 67   Temp 97.8 F (36.6 C) (Oral)   Resp 18   Ht 6\' 1"  (1.854 m)   Wt 74.8 kg   SpO2 97%   BMI 21.77 kg/m  Physical Exam Constitutional:      General: He is not in acute distress.    Appearance: He is not toxic-appearing.  HENT:     Mouth/Throat:     Mouth: Mucous membranes are moist.  Eyes:     Extraocular Movements: Extraocular movements intact.     Pupils: Pupils are equal, round, and reactive to light.  Cardiovascular:     Rate and Rhythm: Regular rhythm. Bradycardia present.  Pulmonary:     Effort: Pulmonary effort is normal.     Breath sounds: Normal breath sounds. No wheezing.  Abdominal:     General: Abdomen is flat.     Palpations: Abdomen is soft.     Tenderness: There is no abdominal tenderness.  Musculoskeletal:     Left foot: Decreased range of motion.       Feet:  Feet:     Right foot:     Toenail Condition: Right toenails are abnormally  thick.     Left foot:     Toenail Condition: Left toenails are abnormally thick.  Skin:    General: Skin is warm and dry.     Capillary Refill: Capillary refill takes less than 2 seconds.  Neurological:     General: No focal deficit present.     Mental Status: He is alert.  Psychiatric:        Mood and Affect: Mood normal.    ED Results / Procedures / Treatments   Labs (all labs ordered are listed, but only abnormal results are displayed) Labs Reviewed  CBC WITH DIFFERENTIAL/PLATELET - Abnormal; Notable for the following components:      Result Value   MCV 101.2 (*)    All other components within normal limits  D-DIMER, QUANTITATIVE - Abnormal; Notable for the following components:   D-Dimer, Quant 1.54 (*)    All other components within normal limits  BASIC METABOLIC PANEL    EKG None  Radiology DG Ankle 2 Views Left  Result Date: 12/24/2020 CLINICAL DATA:  Redness and swelling to left internal ankle. No known injury. EXAM: LEFT ANKLE - 2 VIEW COMPARISON:  None. FINDINGS: There is no evidence of fracture, dislocation, or joint effusion. No erosion or periosteal reaction. Ankle mortise is preserved. There is no evidence of arthropathy or other focal bone abnormality. Mild medial soft tissue edema. No soft tissue calcifications, air, or radiopaque foreign body. IMPRESSION: Mild medial soft tissue edema. No osseous abnormality. Electronically Signed   By: Narda Rutherford M.D.   On: 12/24/2020 22:10    Procedures Procedures   Medications Ordered in ED Medications  apixaban (ELIQUIS) tablet 10 mg (has no administration in time range)    ED Course  I have reviewed the triage vital signs and the nursing notes.  Pertinent labs & imaging results that were available during my care of the patient were reviewed by me and considered in my medical decision making (see chart for details).    MDM Rules/Calculators/A&P                          60 year old male presents with left  lower extremity tenderness, ecchymosis, erythema.  Patient states he has had 27 DVTs in the past.  Patient is not currently on any anticoagulation therapy.  Differential diagnosis includes DVT, osteomyelitis, cellulitis.  At this time I have ordered a left ankle/foot x-ray, D-dimer, BMP, CBC. The x-ray of his left ankle/foot is unremarkable for any kind of fracture or osteomyelitis.  Patient CBC results in no abnormal findings. Patient BMP results and no abnormal findings.  On examination, patient does not have any tenderness over his calf muscle, has a negative Homans' sign.  Additionally he does not have any leg swelling with a circumference greater than 3 cm of the contralateral side.  Patient denies any trauma to the area, recent abrasions or open wounds.  At this time, the etiology of this patient's tenderness, ecchymosis, erythema over his internal left posterior ankle is undetermined.  Patient's D-dimer result is positive. I will advise the patient to return tomorrow for DVT Doppler ultrasound study and will provide him with a single dose of Eliquis prior to discharge. I have explained to the patient that he is at increased risk of bleeding and if he were to hit his head, get into a motor vehicle accident, or suffer any kind of fall he must come back to the ED immediately.  Patient expresses understanding with my instructions to return tomorrow for DVT ultrasound as well as his increased risk for bleeding.  Patient denies any concerns at present time.  Patient is stable on discharge.  Final Clinical Impression(s) / ED Diagnoses Final diagnoses:  Left leg pain    Rx / DC Orders ED Discharge Orders     None        Al Decant, [place] 12/24/20 2356    Linwood Dibbles, MD 12/25/20 1630

## 2020-12-24 NOTE — ED Triage Notes (Signed)
Pt with left leg pain x 3 days. Pt with multiple DVT's in the past.

## 2020-12-25 ENCOUNTER — Ambulatory Visit (HOSPITAL_COMMUNITY)
Admission: RE | Admit: 2020-12-25 | Discharge: 2020-12-25 | Disposition: A | Discharge status: Home / Self Care | Payer: Self-pay | Source: Ambulatory Visit | Attending: Emergency Medicine | Admitting: Emergency Medicine

## 2020-12-25 DIAGNOSIS — R609 Edema, unspecified: Secondary | ICD-10-CM | POA: Insufficient documentation

## 2020-12-25 DIAGNOSIS — I82533 Chronic embolism and thrombosis of popliteal vein, bilateral: Secondary | ICD-10-CM | POA: Insufficient documentation

## 2020-12-25 DIAGNOSIS — Z86711 Personal history of pulmonary embolism: Secondary | ICD-10-CM | POA: Insufficient documentation

## 2020-12-25 IMAGING — US US EXTREM LOW VENOUS
1 series · 12 of 24 positions shown · non-contrast
Comparison: Prior left lower extremity venous duplex ultrasound on
[DATE]

CLINICAL DATA: Lower extremity pain and edema. History of prior
left lower extremity DVT in [95] with associated pulmonary embolism.



[Series 1: us venous img lower bilat (dvt) · portal-venous · 12 of 95 slices shown]
[im 5/95]
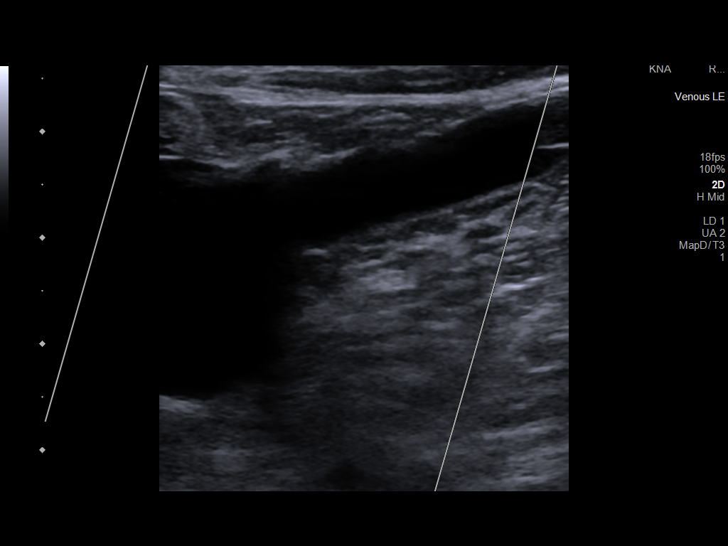
[im 13/95]
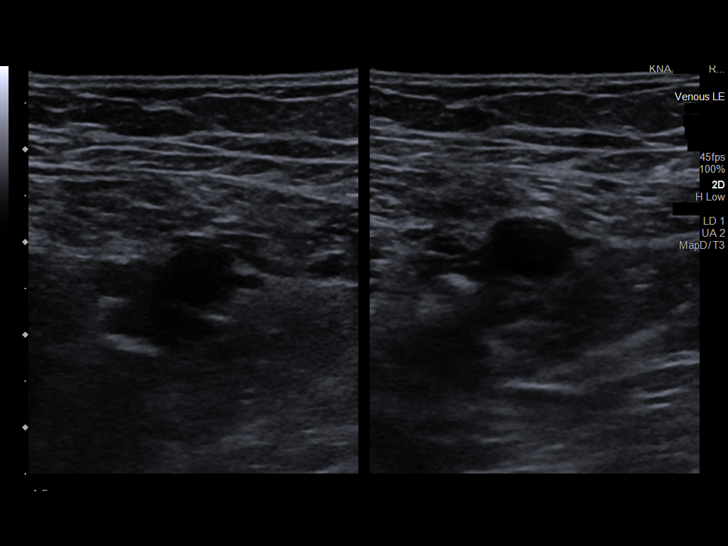
[im 21/95]
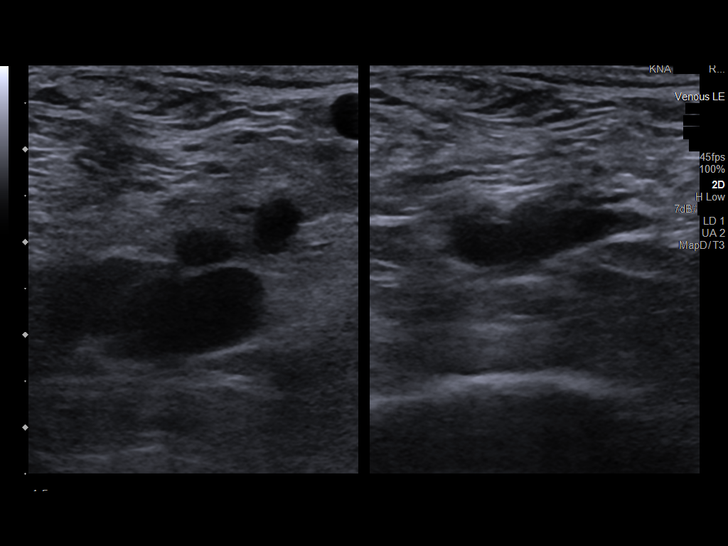
[im 29/95]
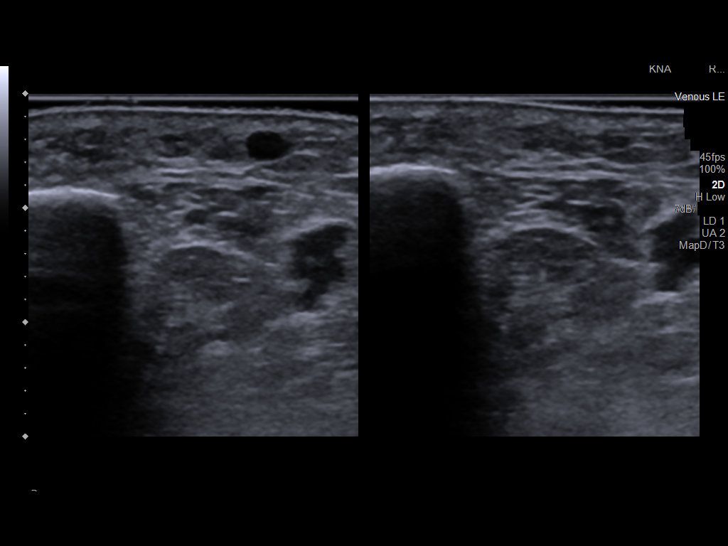
[im 37/95]
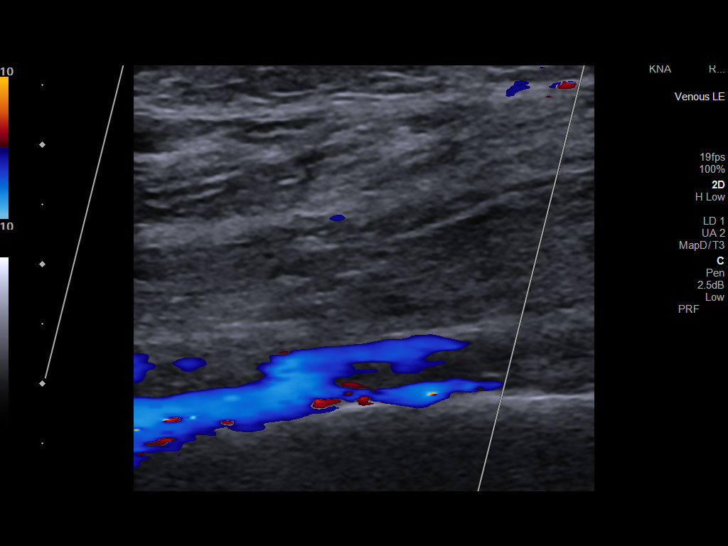
[im 45/95]
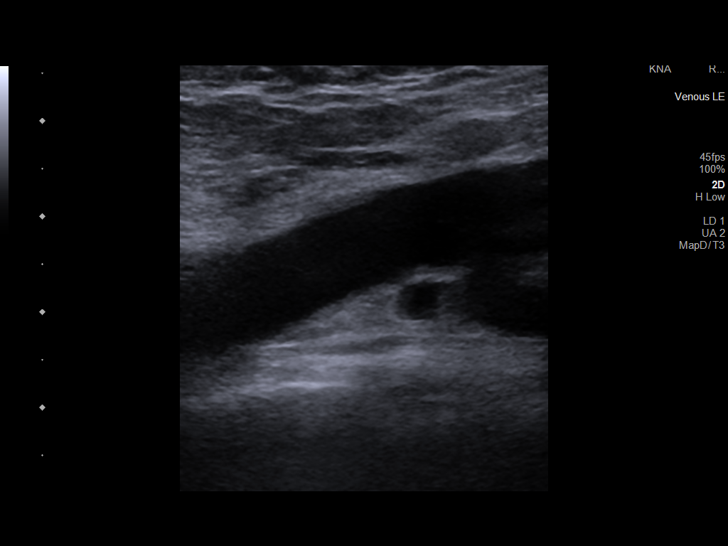
[im 54/95]
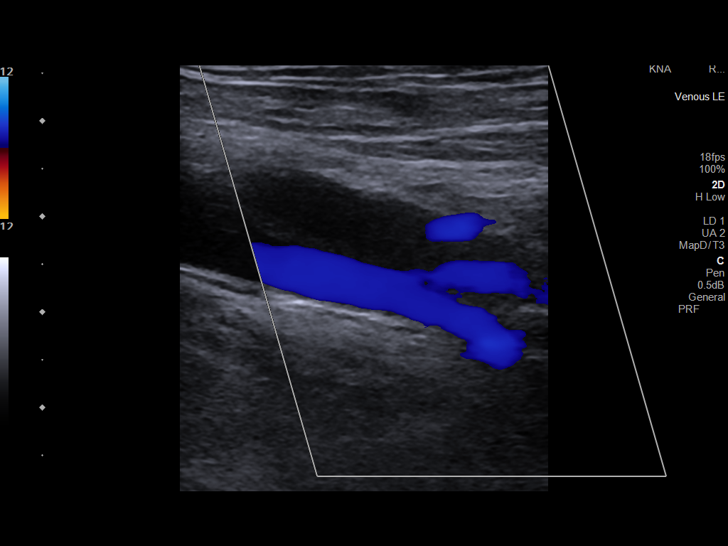
[im 62/95]
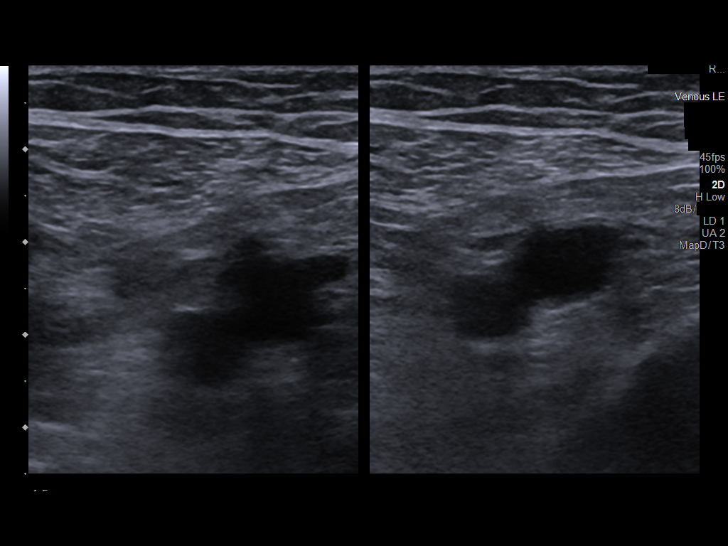
[im 70/95]
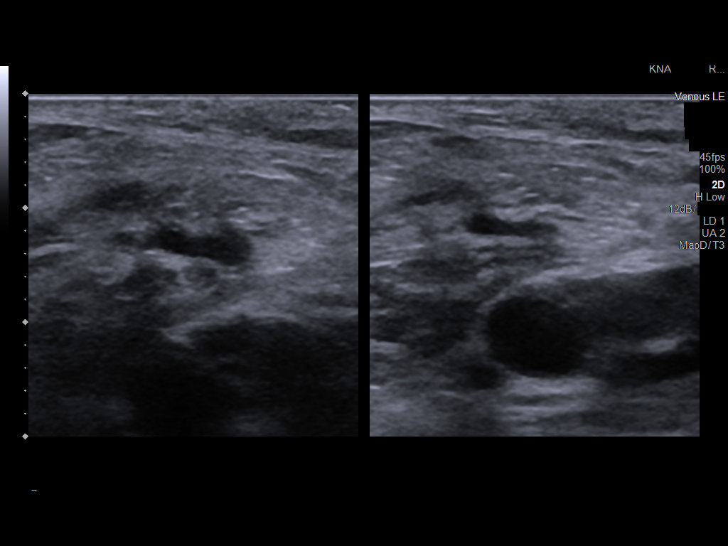
[im 78/95]
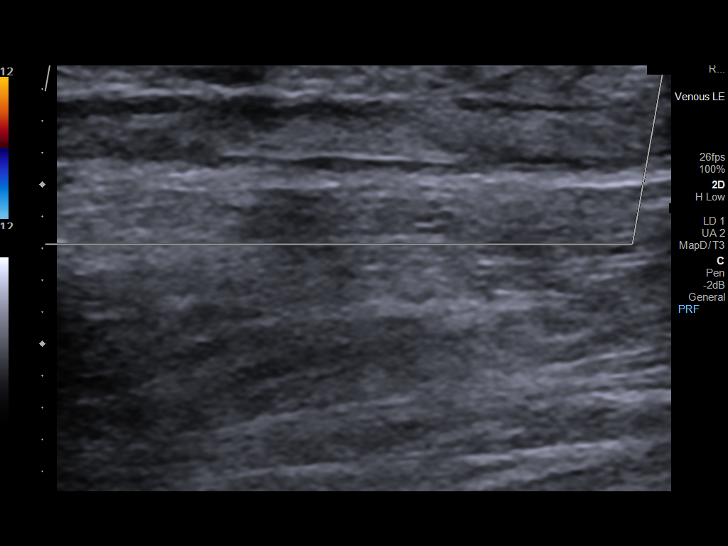
[im 86/95]
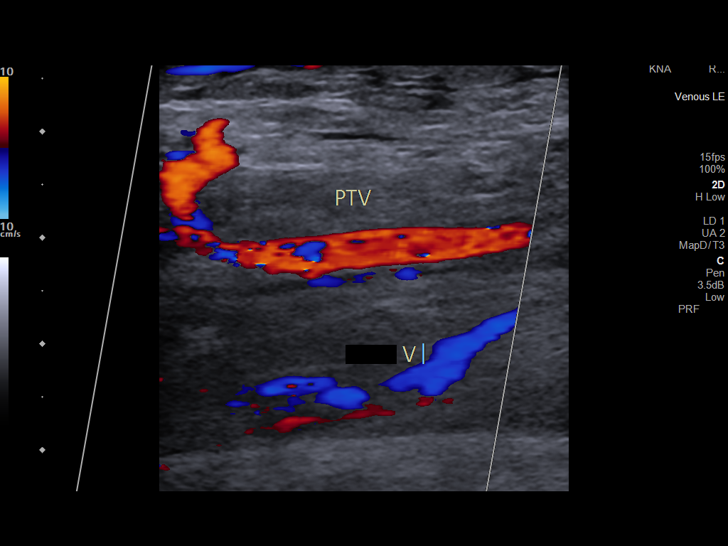
[im 95/95]
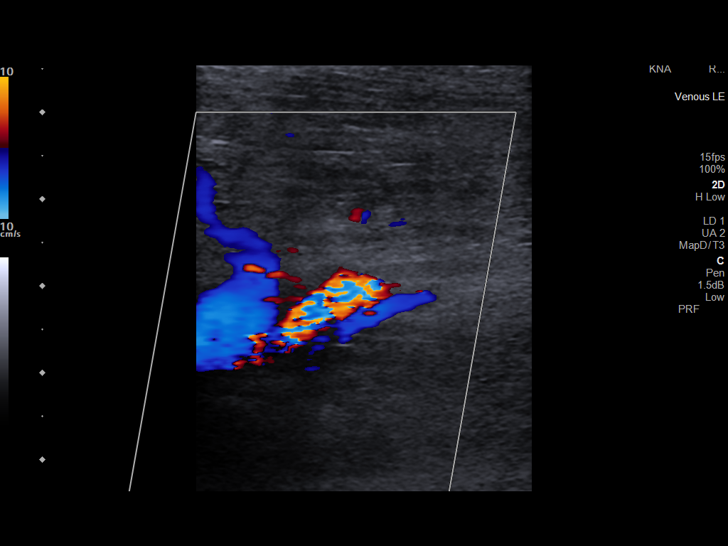

[12 of 24 positions shown; findings below may reference images not displayed]

FINDINGS: RIGHT LOWER EXTREMITY

Common Femoral Vein: No evidence of thrombus. Normal
compressibility, respiratory phasicity and response to augmentation.

Saphenofemoral Junction: No evidence of thrombus. Normal
compressibility and flow on color Doppler imaging.

Profunda Femoral Vein: No evidence of thrombus. Normal
compressibility and flow on color Doppler imaging.

Femoral Vein: No evidence of thrombus. Normal compressibility,
respiratory phasicity and response to augmentation.

Popliteal Vein: Nonocclusive mural thrombus present with appearance
consistent with chronic thrombus.

Calf Veins: No evidence of thrombus. Normal compressibility and flow
on color Doppler imaging.

Superficial Great Saphenous Vein: No evidence of thrombus. Normal
compressibility.

Venous Reflux:  None.

Other Findings: No evidence of superficial thrombophlebitis or
abnormal fluid collection.

LEFT LOWER EXTREMITY

Common Femoral Vein: No evidence of thrombus. Normal
compressibility, respiratory phasicity and response to augmentation.

Saphenofemoral Junction: No evidence of thrombus. Normal
compressibility and flow on color Doppler imaging.

Profunda Femoral Vein: No evidence of thrombus. Normal
compressibility and flow on color Doppler imaging.

Femoral Vein: Proximal femoral vein demonstrates normal patency.
There is thrombus in the mid to distal left femoral vein which is
partially echogenic and most likely predominantly chronic.

Popliteal Vein: Nearly occlusive thrombus in the left popliteal vein
with some flow present. This thrombus is not quite is echogenic and
a component of acute thrombus cannot be excluded.

Calf Veins: No evidence of thrombus. Normal compressibility and flow
on color Doppler imaging.

Superficial Great Saphenous Vein: Thrombophlebitis of the great
saphenous vein in the left calf up to the level of the knee. The
vein is thickened and echogenic in appearance and some of this
thrombophlebitis may be chronic.

Venous Reflux:  None.

Other Findings: No evidence of superficial thrombophlebitis or
abnormal fluid collection.
IMPRESSION: 1. Thrombus in the right popliteal vein is mural and nonocclusive
and appears to be consistent with chronic thrombus.
2. Echogenic thrombus in the mid to distal left femoral vein is
likely predominantly chronic.
3. Nearly occlusive thrombus in the left popliteal vein. While
potentially chronic, a component of acute thrombus in the left
popliteal vein cannot be excluded based on appearance.
4. Thrombophlebitis of the great saphenous vein from the knee region
into the calf. The vein is thickened and echogenic in appearance and
this may be chronic.

## 2021-01-06 ENCOUNTER — Telehealth: Payer: Self-pay | Admitting: Hematology and Oncology

## 2021-01-06 NOTE — Telephone Encounter (Signed)
Scheduled appt per 11/30 referral. Pt is aware of appt date and time. [employer] Estimate was mailed to pt's address. He is aware.

## 2021-01-17 ENCOUNTER — Other Ambulatory Visit: Payer: Self-pay

## 2021-01-17 ENCOUNTER — Inpatient Hospital Stay: Payer: Self-pay | Attending: Hematology and Oncology | Admitting: Hematology and Oncology

## 2021-01-17 ENCOUNTER — Encounter: Payer: Self-pay | Admitting: Hematology and Oncology

## 2021-01-17 ENCOUNTER — Inpatient Hospital Stay: Payer: Self-pay

## 2021-01-17 VITALS — BP 124/70 | HR 60 | Temp 97.8°F | Resp 17 | Wt 178.0 lb

## 2021-01-17 DIAGNOSIS — Z86718 Personal history of other venous thrombosis and embolism: Secondary | ICD-10-CM | POA: Insufficient documentation

## 2021-01-17 DIAGNOSIS — Z7901 Long term (current) use of anticoagulants: Secondary | ICD-10-CM | POA: Insufficient documentation

## 2021-01-17 DIAGNOSIS — Z882 Allergy status to sulfonamides status: Secondary | ICD-10-CM | POA: Insufficient documentation

## 2021-01-17 DIAGNOSIS — Z86711 Personal history of pulmonary embolism: Secondary | ICD-10-CM | POA: Insufficient documentation

## 2021-01-17 DIAGNOSIS — I825Z9 Chronic embolism and thrombosis of unspecified deep veins of unspecified distal lower extremity: Secondary | ICD-10-CM

## 2021-01-17 DIAGNOSIS — I8002 Phlebitis and thrombophlebitis of superficial vessels of left lower extremity: Secondary | ICD-10-CM | POA: Insufficient documentation

## 2021-01-17 DIAGNOSIS — Z832 Family history of diseases of the blood and blood-forming organs and certain disorders involving the immune mechanism: Secondary | ICD-10-CM | POA: Insufficient documentation

## 2021-01-17 DIAGNOSIS — F17210 Nicotine dependence, cigarettes, uncomplicated: Secondary | ICD-10-CM | POA: Insufficient documentation

## 2021-01-17 DIAGNOSIS — I82433 Acute embolism and thrombosis of popliteal vein, bilateral: Secondary | ICD-10-CM | POA: Insufficient documentation

## 2021-01-17 DIAGNOSIS — Z885 Allergy status to narcotic agent status: Secondary | ICD-10-CM | POA: Insufficient documentation

## 2021-01-17 DIAGNOSIS — Z880 Allergy status to penicillin: Secondary | ICD-10-CM | POA: Insufficient documentation

## 2021-01-17 DIAGNOSIS — Z8249 Family history of ischemic heart disease and other diseases of the circulatory system: Secondary | ICD-10-CM | POA: Insufficient documentation

## 2021-01-17 LAB — CBC WITH DIFFERENTIAL/PLATELET
Abs Immature Granulocytes: 0.03 10*3/uL (ref 0.00–0.07)
Basophils Absolute: 0.1 10*3/uL (ref 0.0–0.1)
Basophils Relative: 1 %
Eosinophils Absolute: 0.1 10*3/uL (ref 0.0–0.5)
Eosinophils Relative: 1 %
HCT: 48 % (ref 39.0–52.0)
Hemoglobin: 16.6 g/dL (ref 13.0–17.0)
Immature Granulocytes: 0 %
Lymphocytes Relative: 12 %
Lymphs Abs: 1.3 10*3/uL (ref 0.7–4.0)
MCH: 33 pg (ref 26.0–34.0)
MCHC: 34.6 g/dL (ref 30.0–36.0)
MCV: 95.4 fL (ref 80.0–100.0)
Monocytes Absolute: 0.8 10*3/uL (ref 0.1–1.0)
Monocytes Relative: 7 %
Neutro Abs: 8.9 10*3/uL — ABNORMAL HIGH (ref 1.7–7.7)
Neutrophils Relative %: 79 %
Platelets: 222 10*3/uL (ref 150–400)
RBC: 5.03 MIL/uL (ref 4.22–5.81)
RDW: 13.4 % (ref 11.5–15.5)
WBC: 11.3 10*3/uL — ABNORMAL HIGH (ref 4.0–10.5)
nRBC: 0 % (ref 0.0–0.2)

## 2021-01-17 LAB — COMPREHENSIVE METABOLIC PANEL
ALT: 49 U/L — ABNORMAL HIGH (ref 0–44)
AST: 36 U/L (ref 15–41)
Albumin: 3.9 g/dL (ref 3.5–5.0)
Alkaline Phosphatase: 55 U/L (ref 38–126)
Anion gap: 7 (ref 5–15)
BUN: 13 mg/dL (ref 8–23)
CO2: 25 mmol/L (ref 22–32)
Calcium: 9.1 mg/dL (ref 8.9–10.3)
Chloride: 108 mmol/L (ref 98–111)
Creatinine, Ser: 0.9 mg/dL (ref 0.61–1.24)
GFR, Estimated: >60 mL/min (ref >60)
Glucose, Bld: 90 mg/dL (ref 70–99)
Potassium: 4.8 mmol/L (ref 3.5–5.1)
Sodium: 140 mmol/L (ref 135–145)
Total Bilirubin: 0.3 mg/dL (ref 0.3–1.2)
Total Protein: 7.4 g/dL (ref 6.5–8.1)

## 2021-01-17 LAB — ANTITHROMBIN III: AntiThromb III Func: 103 % (ref 75–120)

## 2021-01-17 NOTE — Assessment & Plan Note (Signed)
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  Patient arrived to the appointment by himself.  He reports 29-30 blood clots in each of his lower extremities and prior episodes of PE.  He appears to have been intermittently on anticoagulation partly because of his choice and since he was lost to follow-up from time to time.  He tells me that he cannot afford the novel anticoagulants because he has no insurance.  He currently has been taking Coumadin, last INR 2.6.  He denies any new health issues such as worsening bilateral lower extremity swelling, chest pain or shortness of breath today.  I have discussed that at this point since he has had multiple episodes of clotting, he needs to stay on indefinite anticoagulation.  He apparently disagrees that although he believes he should be on anticoagulation this does not necessarily have to be a medicine and he could find some natural ways to thin his blood.  I did explain to him that some of these natural products are not well studied and may not be as efficacious as warfarin or other novel anticoagulants.  With regards to hypercoagulable work-up, I do not believe this will change anticoagulation recommendations but patient mentions that his primary care physician wanted him to be evaluated for this and he would like to proceed with hypercoagulable work-up which has been ordered today.  Once again we have discussed the importance of staying compliant with his blood thinners. He would like to do a telephone visit for next appointment to review his results. Age-appropriate cancer screening recommended.  He should talk to his PCP about role of CT for lung cancer screening.

## 2021-01-17 NOTE — Progress Notes (Signed)
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  Patient arrived to the appointment by himself.  He reports 29-30 blood clots in each of his lower extremities and prior episodes of PE.  He appears to have been intermittently on anticoagulation partly because of his choice and since he was lost to follow-up from time to time.  He tells me that he cannot afford the novel anticoagulants because he has no insurance.  He currently has been taking Coumadin, last INR 2.6.  He denies any new health issues such as worsening bilateral lower extremity swelling, chest pain or shortness of breath today.  I have discussed that at this point since he has had multiple episodes of clotting, he needs to stay on indefinite anticoagulation.  He apparently disagrees that although he believes he should be on anticoagulation this does not necessarily have to be a medicine and he could find some natural ways to thin his blood.  I did explain to him that some of these natural products are not well studied and may not be as efficacious as warfarin or other novel anticoagulants.  With regards to hypercoagulable work-up, I do not believe this will change anticoagulation recommendations but patient mentions that his primary care physician wanted him to be evaluated for this and he would like to proceed with hypercoagulable work-up which has been ordered today.  Once again we have discussed the importance of staying compliant with his blood thinners. He would like to do a telephone visit for next appointment to review his  results. Age-appropriate cancer screening recommended.  He should talk to his PCP about role of CT for lung cancer screening.  Orders Placed This Encounter  Procedures   CBC with Differential/Platelet    Standing Status:   Standing    Number of Occurrences:   22    Standing Expiration Date:   01/17/2022   Factor 5 leiden    Standing Status:   Future    Number of Occurrences:   1    Standing Expiration Date:   01/17/2022   Lupus anticoagulant panel    Standing Status:   Future    Number of Occurrences:   1    Standing Expiration Date:   01/17/2022   Cardiolipin antibodies, IgG, IgM, IgA*    Standing Status:   Future    Number of Occurrences:   1    Standing Expiration Date:   01/17/2022   Beta-2-glycoprotein i abs, IgG/M/A    Standing Status:   Future    Number of Occurrences:   1    Standing Expiration Date:   01/17/2022   Prothrombin gene mutation   Antithrombin III    Standing Status:   Future    Number of Occurrences:   1    Standing Expiration Date:   01/17/2022   Comprehensive metabolic panel    Standing Status:   Standing    Number of Occurrences:   33    Standing Expiration Date:   01/17/2022     HISTORY OF PRESENTING ILLNESS:   Craig Hahn 60 y.o. male is here because of recurrent DVT.  This is a 60 year old  male patient with past medical history significant for multiple DVTs, currently on anticoagulation with warfarin referred to hematology for anticoagulation recommendations and hypercoagulable work-up.  Patient arrived to the appointment about 40 minutes late.  He tells me that he has had about 29 or 30 clots in each of his legs and has had blood clots since 1994.  His not a great historian however he believes that most of his blood clots were secondary to his lifestyle.  He says the first blood clot happened after he drove 17 hours straight.  He also remembers 1 episode when he had a PE.  He was prescribed warfarin but apparently try to find a natural way of  blood thinning and was lost to follow-up intermittently.  Most recently he had another episode of DVT in November 2022 when he was found to have right popliteal vein thrombus, mid to distal left femoral vein thrombus and nearly occlusive thrombus in the left popliteal vein.  It was not entirely clear if there was an acute on chronic thrombus.  He was restarted on anticoagulation with warfarin, last INR 2.6.  He tells me that he does believe he needs to be on blood thinners for life for the choice of blood thinners could be a natural medication according to him.  He says he has no insurance so he can only take warfarin and cannot try the new blood thinners.  He says his brother has had blood clots as well.  He says the blood clot started in his family.  He is a smoker, tells me that his job is very stressful and hence he had to smoke. Besides the blood clotting disorder, he denies any ongoing health problems. Rest of the pertinent 10 point ROS reviewed and negative.  REVIEW OF SYSTEMS:   Constitutional: Denies fevers, chills or abnormal night sweats Eyes: Denies blurriness of vision, double vision or watery eyes Ears, nose, mouth, throat, and face: Denies mucositis or sore throat Respiratory: Denies cough, dyspnea or wheezes Cardiovascular: Denies palpitation, chest discomfort or lower extremity swelling Gastrointestinal:  Denies nausea, heartburn or change in bowel habits Skin: Denies abnormal skin rashes Lymphatics: Denies new lymphadenopathy or easy bruising Neurological:Denies numbness, tingling or new weaknesses Behavioral/Psych: Mood is stable, no new changes  All other systems were reviewed with the patient and are negative.  MEDICAL HISTORY:  Past Medical History:  Diagnosis Date   Allergy    Anxiety    Clotting disorder (HCC)    Unsure if has ever been worked up, per pet has had 9 clots in the past, last was in 2006, both brothers also have clotting issues   Depression    DVT (deep  venous thrombosis) (HCC)     SURGICAL HISTORY: Past Surgical History:  Procedure Laterality Date   dental implants     x8    SOCIAL HISTORY: Social History   Socioeconomic History   Marital status: Single    Spouse name: Not on file   Number of children: 0   Years of education: Not on file   Highest education level: Not on file  Occupational History   Occupation: Holiday representative  Tobacco Use   Smoking status: Former    Packs/day: 1.00    Types: E-cigarettes, Cigarettes    Quit date: 01/14/2017    Years since quitting: 4.0   Smokeless tobacco: Never  Vaping Use   Vaping Use: Never used  Substance and Sexual Activity   Alcohol use: Yes    Comment: daily 4  beers    Drug use: No   Sexual activity: Not Currently    Partners: Female    Birth control/protection: Condom  Other Topics Concern   Not on file  Social History Narrative   Not on file   Social Determinants of Health   Financial Resource Strain: Not on file  Food Insecurity: Not on file  Transportation Needs: Not on file  Physical Activity: Not on file  Stress: Not on file  Social Connections: Not on file  Intimate Partner Violence: Not on file    FAMILY HISTORY: Family History  Problem Relation Age of Onset   Heart attack Father    Clotting disorder Brother    Clotting disorder Brother     ALLERGIES:  is allergic to codeine, penicillins, and sulfa antibiotics.  MEDICATIONS:  Current Outpatient Medications  Medication Sig Dispense Refill   MISC NATURAL PRODUCTS PO Take 3 capsules by mouth 2 (two) times daily. Serrapeptase & Nattokinase     nitroGLYCERIN (NITROSTAT) 0.4 MG SL tablet Place 1 tablet (0.4 mg total) under the tongue every 5 (five) minutes as needed for chest pain. 90 tablet 3   No current facility-administered medications for this visit.     PHYSICAL EXAMINATION: ECOG PERFORMANCE STATUS: 0 - Asymptomatic  Vitals:   01/17/21 1133  BP: 124/70  Pulse: 60  Resp: 17  Temp: 97.8 F  (36.6 C)  SpO2: 97%   Filed Weights   01/17/21 1133  Weight: 178 lb (80.7 kg)    GENERAL:alert, no distress and comfortable SKIN: skin color, texture, turgor are normal, no rashes or significant lesions EYES: normal, conjunctiva are pink and non-injected, sclera clear OROPHARYNX:no exudate, no erythema and lips, buccal mucosa, and tongue normal  NECK: supple, thyroid normal size, non-tender, without nodularity LYMPH:  no palpable lymphadenopathy in the cervical, axillary or inguinal LUNGS: clear to auscultation and percussion with normal breathing effort HEART: regular rate & rhythm and no murmurs and no lower extremity edema ABDOMEN:abdomen soft, non-tender and normal bowel sounds Musculoskeletal: Mild bilateral lower extremity swelling PSYCH: alert & oriented x 3 with fluent speech NEURO: no focal motor/sensory deficits  LABORATORY DATA:  I have reviewed the data as listed Lab Results  Component Value Date   WBC 11.3 (H) 01/17/2021   HGB 16.6 01/17/2021   HCT 48.0 01/17/2021   MCV 95.4 01/17/2021   PLT 222 01/17/2021     Chemistry      Component Value Date/Time   NA 140 01/17/2021 1300   K 4.8 01/17/2021 1300   CL 108 01/17/2021 1300   CO2 25 01/17/2021 1300   BUN 13 01/17/2021 1300   CREATININE 0.90 01/17/2021 1300      Component Value Date/Time   CALCIUM 9.1 01/17/2021 1300   ALKPHOS 55 01/17/2021 1300   AST 36 01/17/2021 1300   ALT 49 (H) 01/17/2021 1300   BILITOT 0.3 01/17/2021 1300     I have reviewed pertinent medical records  RADIOGRAPHIC STUDIES: I have personally reviewed the radiological images as listed and agreed with the findings in the report. DG Ankle 2 Views Left  Result Date: 12/24/2020 CLINICAL DATA:  Redness and swelling to left internal ankle. No known injury. EXAM: LEFT ANKLE - 2 VIEW COMPARISON:  None. FINDINGS: There is no evidence of fracture, dislocation, or joint effusion. No erosion or periosteal reaction. Ankle mortise is  preserved. There is no evidence of arthropathy or other focal bone abnormality. Mild medial soft tissue edema. No soft tissue calcifications, air,  or radiopaque foreign body. IMPRESSION: Mild medial soft tissue edema. No osseous abnormality. Electronically Signed   By: Narda Rutherford M.D.   On: 12/24/2020 22:10   US Venous Img Lower Bilateral  Result Date: 12/25/2020 CLINICAL DATA:  Lower extremity pain and edema. History of prior left lower extremity DVT in 2017 with associated pulmonary embolism. EXAM: BILATERAL LOWER EXTREMITY VENOUS DOPPLER ULTRASOUND TECHNIQUE: Gray-scale sonography with graded compression, as well as color Doppler and duplex ultrasound were performed to evaluate the lower extremity deep venous systems from the level of the common femoral vein and including the common femoral, femoral, profunda femoral, popliteal and calf veins including the posterior tibial, peroneal and gastrocnemius veins when visible. The superficial great saphenous vein was also interrogated. Spectral Doppler was utilized to evaluate flow at rest and with distal augmentation maneuvers in the common femoral, femoral and popliteal veins. COMPARISON:  Prior left lower extremity venous duplex ultrasound on 09/26/2015 FINDINGS: RIGHT LOWER EXTREMITY Common Femoral Vein: No evidence of thrombus. Normal compressibility, respiratory phasicity and response to augmentation. Saphenofemoral Junction: No evidence of thrombus. Normal compressibility and flow on color Doppler imaging. Profunda Femoral Vein: No evidence of thrombus. Normal compressibility and flow on color Doppler imaging. Femoral Vein: No evidence of thrombus. Normal compressibility, respiratory phasicity and response to augmentation. Popliteal Vein: Nonocclusive mural thrombus present with appearance consistent with chronic thrombus. Calf Veins: No evidence of thrombus. Normal compressibility and flow on color Doppler imaging. Superficial Great Saphenous Vein: No  evidence of thrombus. Normal compressibility. Venous Reflux:  None. Other Findings: No evidence of superficial thrombophlebitis or abnormal fluid collection. LEFT LOWER EXTREMITY Common Femoral Vein: No evidence of thrombus. Normal compressibility, respiratory phasicity and response to augmentation. Saphenofemoral Junction: No evidence of thrombus. Normal compressibility and flow on color Doppler imaging. Profunda Femoral Vein: No evidence of thrombus. Normal compressibility and flow on color Doppler imaging. Femoral Vein: Proximal femoral vein demonstrates normal patency. There is thrombus in the mid to distal left femoral vein which is partially echogenic and most likely predominantly chronic. Popliteal Vein: Nearly occlusive thrombus in the left popliteal vein with some flow present. This thrombus is not quite is echogenic and a component of acute thrombus cannot be excluded. Calf Veins: No evidence of thrombus. Normal compressibility and flow on color Doppler imaging. Superficial Great Saphenous Vein: Thrombophlebitis of the great saphenous vein in the left calf up to the level of the knee. The vein is thickened and echogenic in appearance and some of this thrombophlebitis may be chronic. Venous Reflux:  None. Other Findings: No evidence of superficial thrombophlebitis or abnormal fluid collection. IMPRESSION: 1. 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 2. Echogenic thrombus in the mid to distal left femoral vein is likely predominantly chronic. 3. Nearly occlusive thrombus in the left popliteal vein. While potentially chronic, a component of acute thrombus in the left popliteal vein cannot be excluded based on appearance. 4. Thrombophlebitis of the great saphenous vein from the knee region into the calf. The vein is thickened and echogenic in appearance and this may be chronic. Electronically Signed   By: Irish Lack M.D.   On:  12/25/2020 14:37    All questions were answered. The patient knows to call the clinic with any problems, questions or concerns. I spent 45 minutes in the care of this patient including H and P, review of records, counseling and coordination of care.     Rachel Moulds, MD  01/17/2021 2:31 PM

## 2021-01-18 LAB — BETA-2-GLYCOPROTEIN I ABS, IGG/M/A
Beta-2 Glyco I IgG: <9 GPI IgG units (ref 0–20)
Beta-2-Glycoprotein I IgA: <9 GPI IgA units (ref 0–25)
Beta-2-Glycoprotein I IgM: <9 GPI IgM units (ref 0–32)

## 2021-01-19 LAB — CARDIOLIPIN ANTIBODIES, IGG, IGM, IGA
Anticardiolipin IgA: <9 APL U/mL (ref 0–11)
Anticardiolipin IgG: <9 GPL U/mL (ref 0–14)
Anticardiolipin IgM: 32 MPL U/mL — ABNORMAL HIGH (ref 0–12)

## 2021-01-20 ENCOUNTER — Encounter (HOSPITAL_BASED_OUTPATIENT_CLINIC_OR_DEPARTMENT_OTHER): Payer: Self-pay | Admitting: Emergency Medicine

## 2021-01-20 ENCOUNTER — Other Ambulatory Visit: Payer: Self-pay

## 2021-01-20 ENCOUNTER — Emergency Department (HOSPITAL_BASED_OUTPATIENT_CLINIC_OR_DEPARTMENT_OTHER): Payer: Self-pay

## 2021-01-20 ENCOUNTER — Observation Stay (HOSPITAL_BASED_OUTPATIENT_CLINIC_OR_DEPARTMENT_OTHER)
Admission: EM | Admit: 2021-01-20 | Discharge: 2021-01-22 | Disposition: A | Discharge status: Home / Self Care | Payer: Self-pay | Attending: Family Medicine | Admitting: Family Medicine

## 2021-01-20 DIAGNOSIS — Z20822 Contact with and (suspected) exposure to covid-19: Secondary | ICD-10-CM | POA: Insufficient documentation

## 2021-01-20 DIAGNOSIS — S0990XA Unspecified injury of head, initial encounter: Principal | ICD-10-CM | POA: Insufficient documentation

## 2021-01-20 DIAGNOSIS — I700 Atherosclerosis of aorta: Secondary | ICD-10-CM | POA: Insufficient documentation

## 2021-01-20 DIAGNOSIS — F1010 Alcohol abuse, uncomplicated: Secondary | ICD-10-CM | POA: Diagnosis present

## 2021-01-20 DIAGNOSIS — Z79899 Other long term (current) drug therapy: Secondary | ICD-10-CM | POA: Insufficient documentation

## 2021-01-20 DIAGNOSIS — W01198A Fall on same level from slipping, tripping and stumbling with subsequent striking against other object, initial encounter: Secondary | ICD-10-CM | POA: Insufficient documentation

## 2021-01-20 DIAGNOSIS — R001 Bradycardia, unspecified: Secondary | ICD-10-CM | POA: Insufficient documentation

## 2021-01-20 DIAGNOSIS — Z87891 Personal history of nicotine dependence: Secondary | ICD-10-CM | POA: Insufficient documentation

## 2021-01-20 DIAGNOSIS — Z7901 Long term (current) use of anticoagulants: Secondary | ICD-10-CM | POA: Insufficient documentation

## 2021-01-20 DIAGNOSIS — R791 Abnormal coagulation profile: Secondary | ICD-10-CM | POA: Insufficient documentation

## 2021-01-20 DIAGNOSIS — I82409 Acute embolism and thrombosis of unspecified deep veins of unspecified lower extremity: Secondary | ICD-10-CM | POA: Diagnosis present

## 2021-01-20 DIAGNOSIS — Y900 Blood alcohol level of less than 20 mg/100 ml: Secondary | ICD-10-CM | POA: Insufficient documentation

## 2021-01-20 DIAGNOSIS — R55 Syncope and collapse: Secondary | ICD-10-CM | POA: Diagnosis present

## 2021-01-20 LAB — PROTIME-INR
INR: 3.7 — ABNORMAL HIGH (ref 0.8–1.2)
Prothrombin Time: 36.8 seconds — ABNORMAL HIGH (ref 11.4–15.2)

## 2021-01-20 LAB — CBC WITH DIFFERENTIAL/PLATELET
Abs Immature Granulocytes: 0.02 10*3/uL (ref 0.00–0.07)
Basophils Absolute: 0 10*3/uL (ref 0.0–0.1)
Basophils Relative: 1 %
Eosinophils Absolute: 0.2 10*3/uL (ref 0.0–0.5)
Eosinophils Relative: 2 %
HCT: 46.5 % (ref 39.0–52.0)
Hemoglobin: 16.2 g/dL (ref 13.0–17.0)
Immature Granulocytes: 0 %
Lymphocytes Relative: 29 %
Lymphs Abs: 2.1 10*3/uL (ref 0.7–4.0)
MCH: 32.9 pg (ref 26.0–34.0)
MCHC: 34.8 g/dL (ref 30.0–36.0)
MCV: 94.5 fL (ref 80.0–100.0)
Monocytes Absolute: 0.6 10*3/uL (ref 0.1–1.0)
Monocytes Relative: 8 %
Neutro Abs: 4.5 10*3/uL (ref 1.7–7.7)
Neutrophils Relative %: 60 %
Platelets: 196 10*3/uL (ref 150–400)
RBC: 4.92 MIL/uL (ref 4.22–5.81)
RDW: 13.2 % (ref 11.5–15.5)
WBC: 7.4 10*3/uL (ref 4.0–10.5)
nRBC: 0 % (ref 0.0–0.2)

## 2021-01-20 LAB — TROPONIN I (HIGH SENSITIVITY): Troponin I (High Sensitivity): 4 ng/L (ref <18)

## 2021-01-20 LAB — COMPREHENSIVE METABOLIC PANEL
ALT: 27 U/L (ref 0–44)
AST: 20 U/L (ref 15–41)
Albumin: 4.1 g/dL (ref 3.5–5.0)
Alkaline Phosphatase: 43 U/L (ref 38–126)
Anion gap: 7 (ref 5–15)
BUN: 10 mg/dL (ref 8–23)
CO2: 26 mmol/L (ref 22–32)
Calcium: 9 mg/dL (ref 8.9–10.3)
Chloride: 104 mmol/L (ref 98–111)
Creatinine, Ser: 0.77 mg/dL (ref 0.61–1.24)
GFR, Estimated: >60 mL/min (ref >60)
Glucose, Bld: 111 mg/dL — ABNORMAL HIGH (ref 70–99)
Potassium: 3.7 mmol/L (ref 3.5–5.1)
Sodium: 137 mmol/L (ref 135–145)
Total Bilirubin: 0.4 mg/dL (ref 0.3–1.2)
Total Protein: 6.8 g/dL (ref 6.5–8.1)

## 2021-01-20 LAB — RESP PANEL BY RT-PCR (FLU A&B, COVID) ARPGX2
Influenza A by PCR: NEGATIVE
Influenza B by PCR: NEGATIVE
SARS Coronavirus 2 by RT PCR: NEGATIVE

## 2021-01-20 LAB — CBG MONITORING, ED: Glucose-Capillary: 84 mg/dL (ref 70–99)

## 2021-01-20 IMAGING — DX DG CHEST 1V PORT
1 series · 2 of 2 positions shown · non-contrast
Comparison: Chest x-ray [DATE], CT chest [DATE]

CLINICAL DATA: Loss of consciousness.

EXAM:
PORTABLE CHEST 1 VIEW

[Series 1: chest · 0.14mm/px · 2 of 2 slices shown]
[im 1/2]
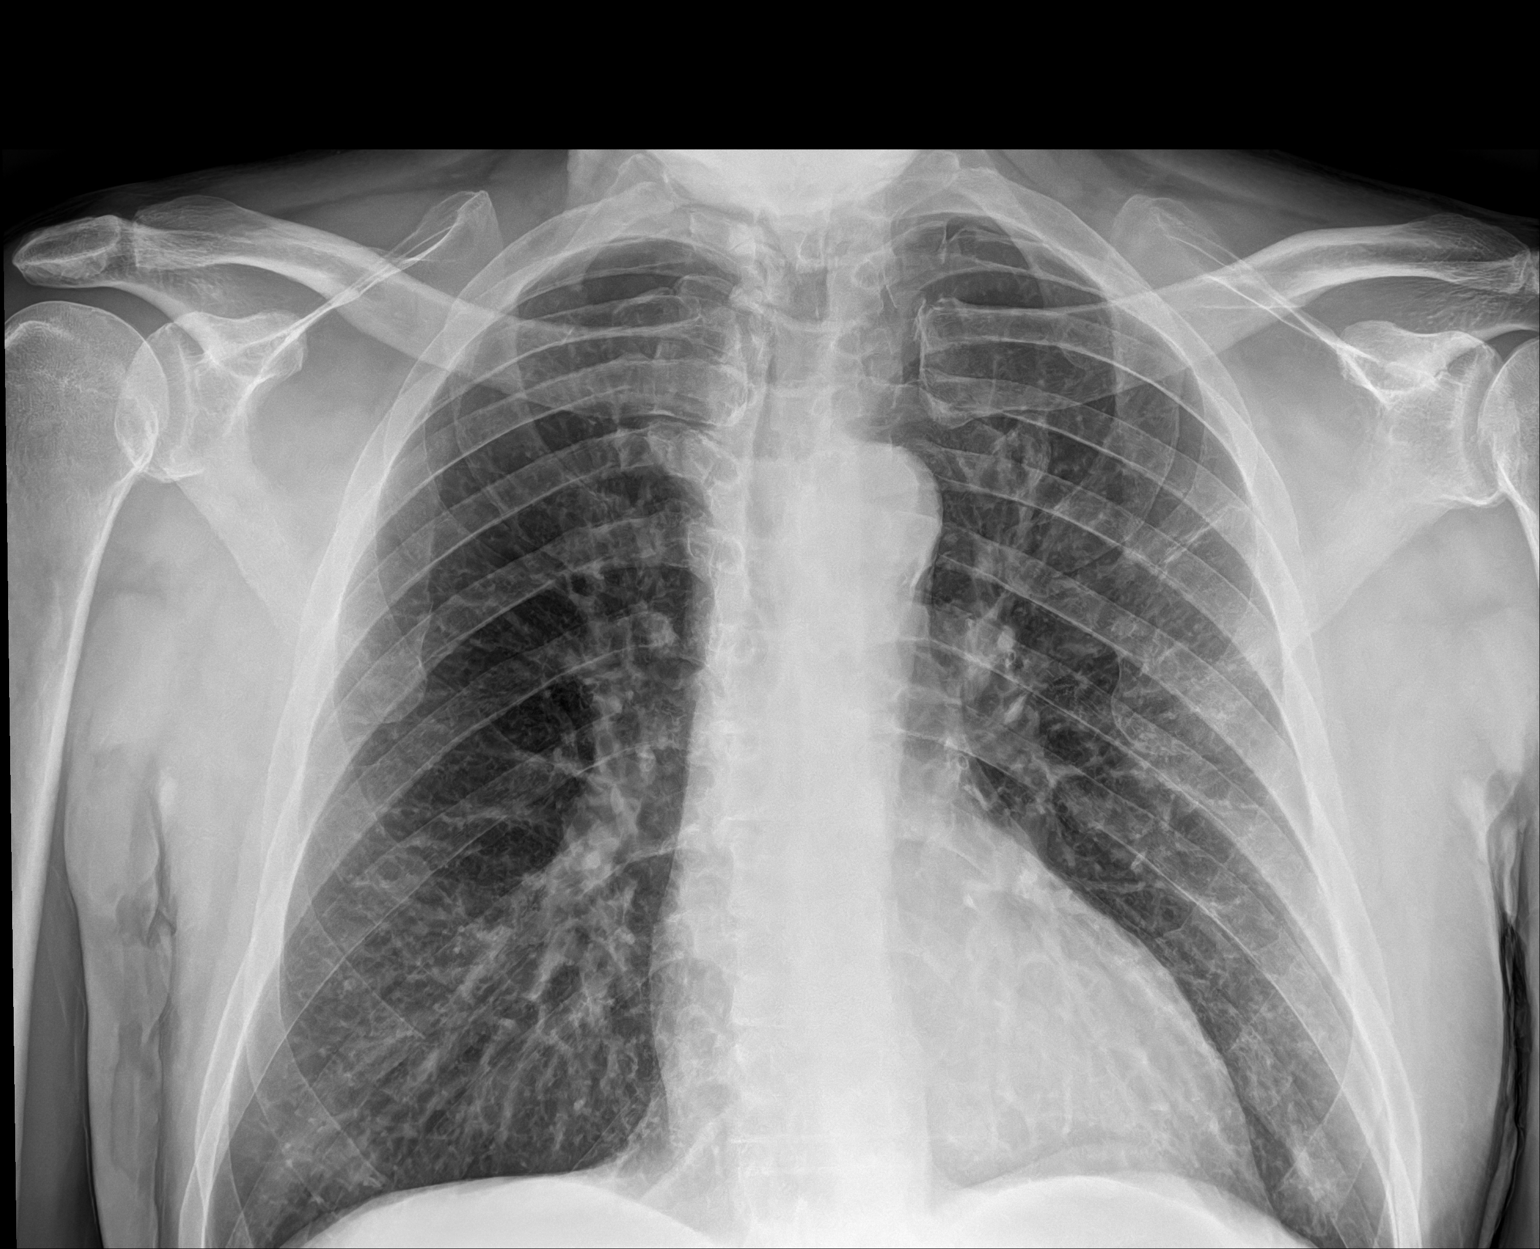
[im 2/2]
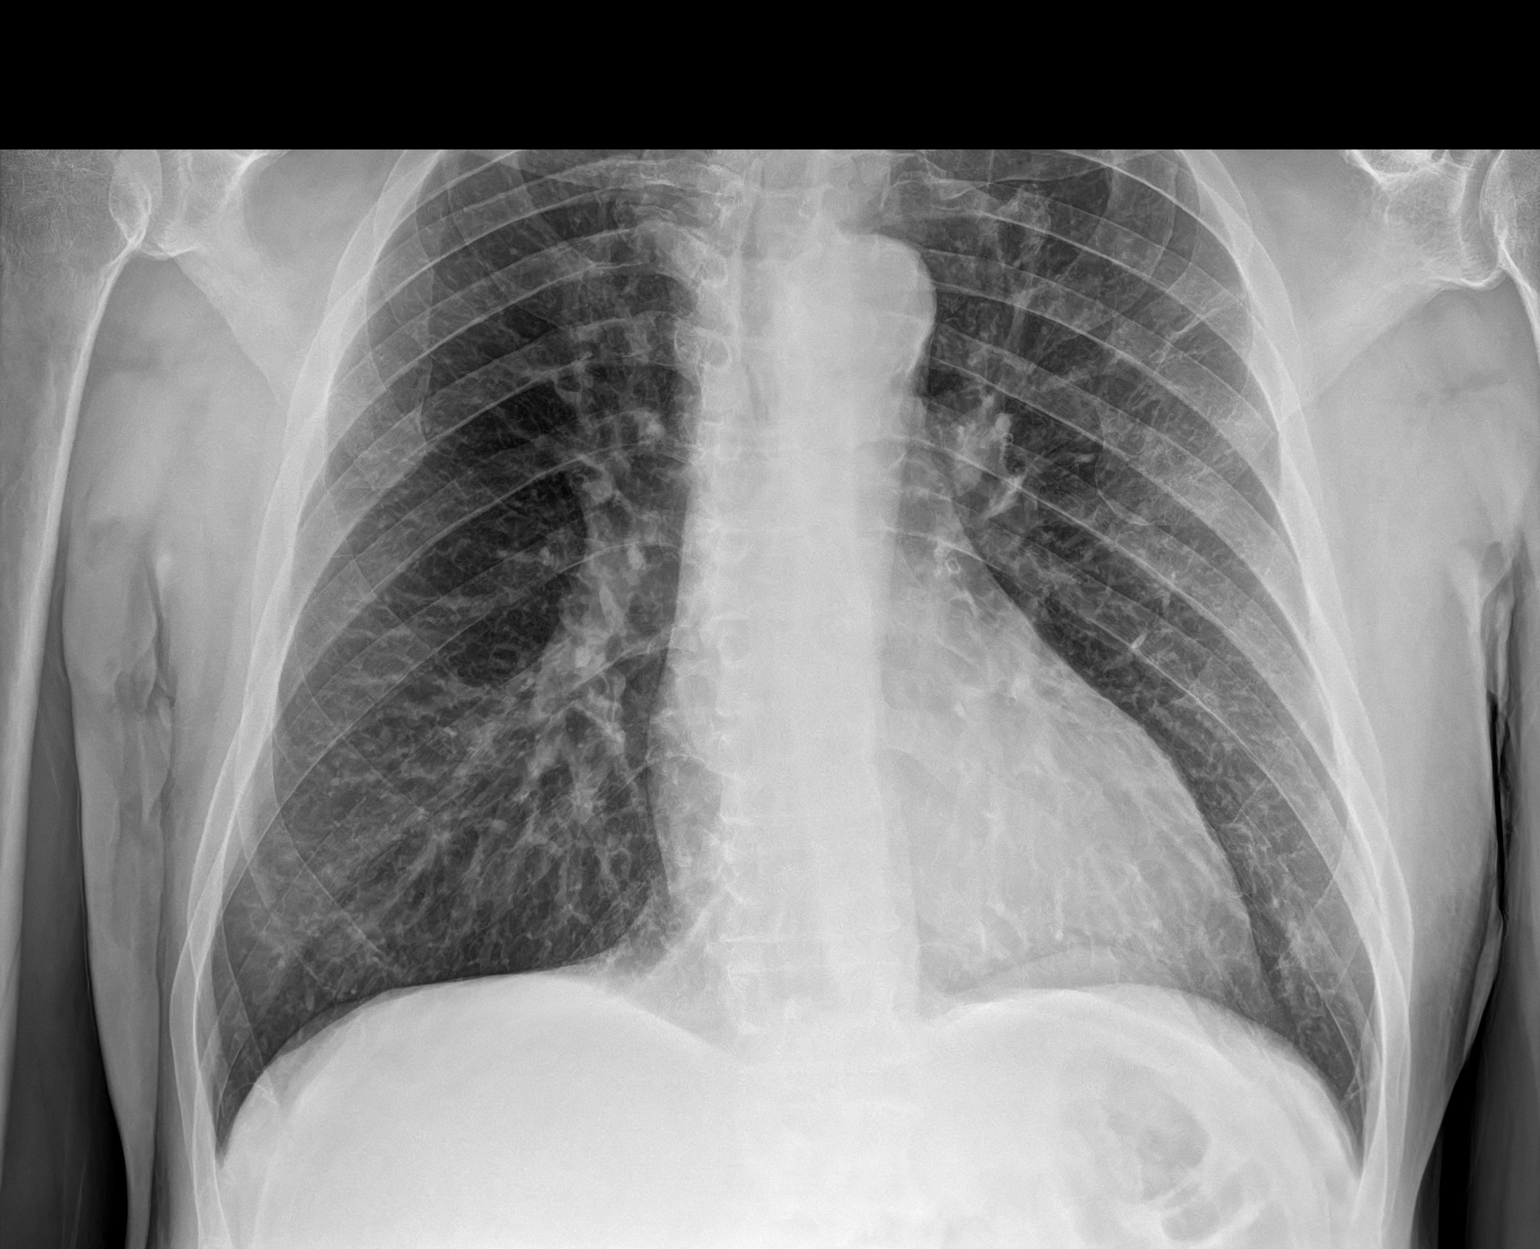

[2 of 2 positions shown; findings below may reference images not displayed]

FINDINGS: The heart and mediastinal contours are unchanged. Aortic
calcification.

No focal consolidation. No pulmonary edema. No pleural effusion. No
pneumothorax.

No acute osseous abnormality.
IMPRESSION: No active disease.

## 2021-01-20 IMAGING — CT CT HEAD W/O CM
4 series · 15 of 47 positions shown, 17 images · non-contrast
Comparison: None.

CLINICAL DATA: Head trauma, coagulopathy (Age 18-64y) warfarin,
loc; fall, coagulopathic, loc

EXAM:
CT HEAD WITHOUT CONTRAST
CT CERVICAL SPINE WITHOUT CONTRAST
TECHNIQUE: Multidetector CT imaging of the head and cervical spine was
performed following the standard protocol without intravenous
contrast. Multiplanar CT image reconstructions of the cervical spine
were also generated.

[Series 2: head bone · axial · 0.44mm/px · z∈[-79,-63]mm · 2 of 83 slices shown]
[im 9/83  bone]
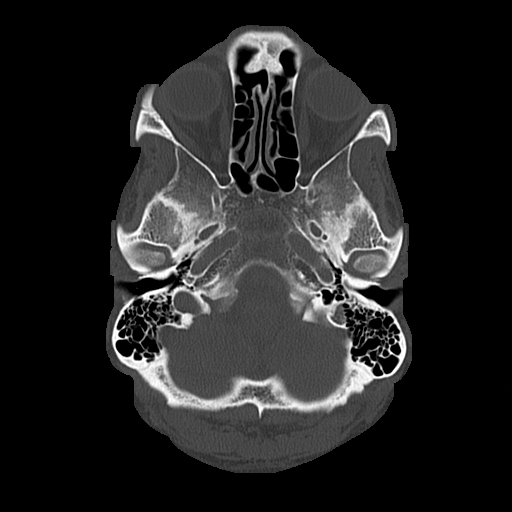
[im 17/83  bone]
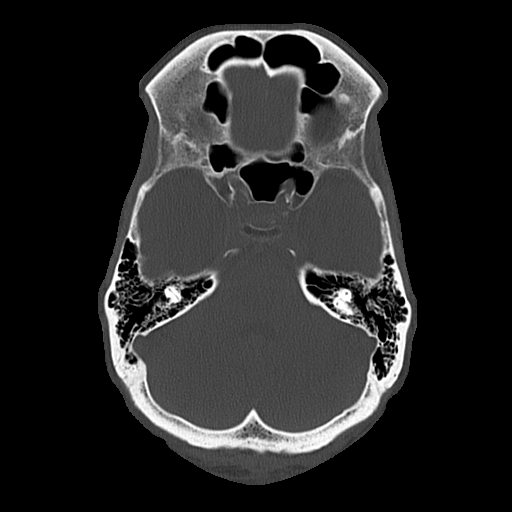

[Series 3: head wo · axial · 0.44mm/px · z∈[-75,+45]mm · 7 of 34 slices shown, 9 images]
[im 5/34  brain]
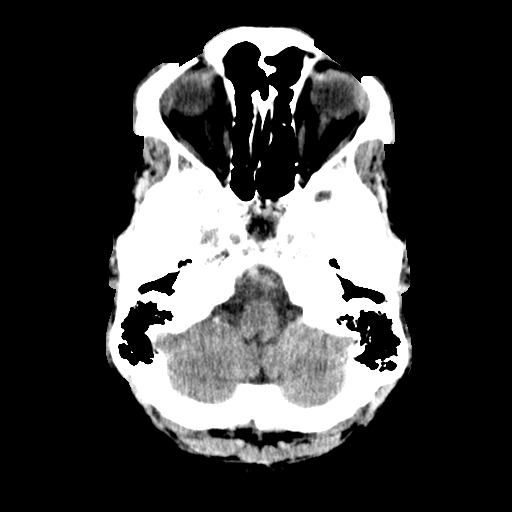
[im 5/34  bone]
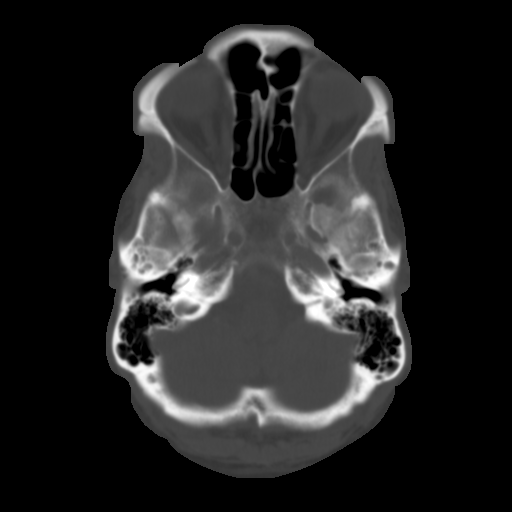
[im 9/34  brain]
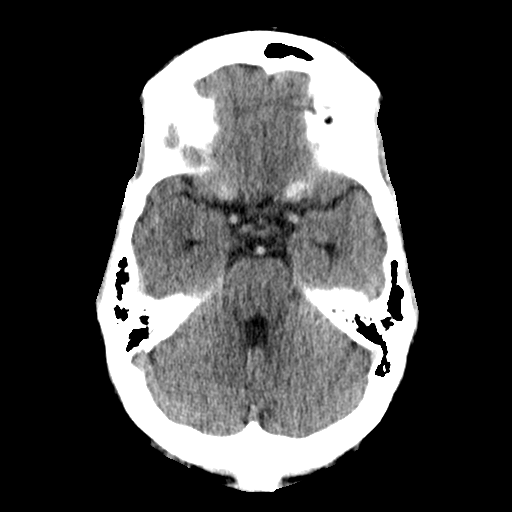
[im 13/34  brain]
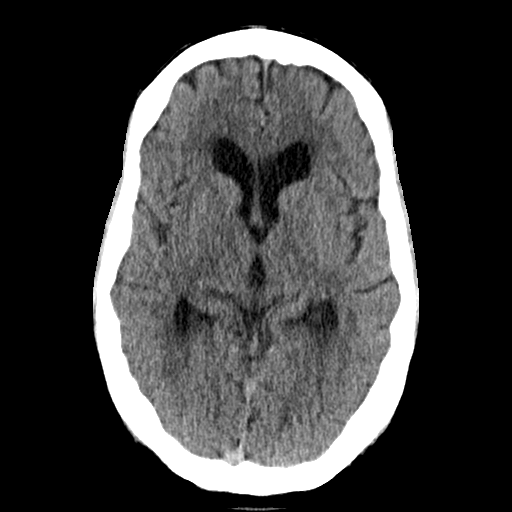
[im 17/34  brain]
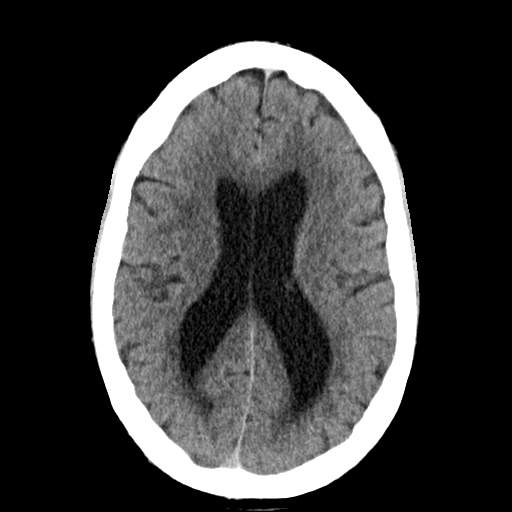
[im 21/34  brain]
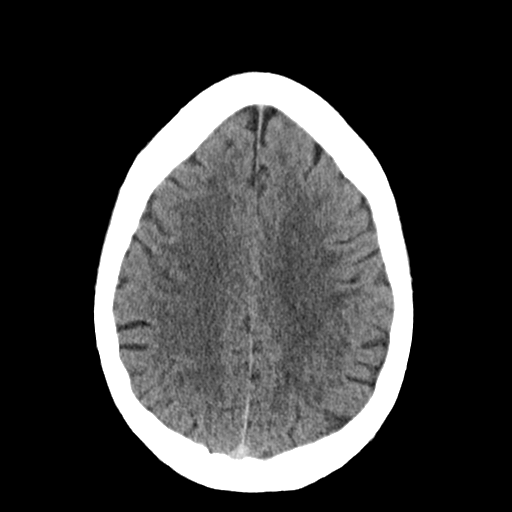
[im 21/34  bone]
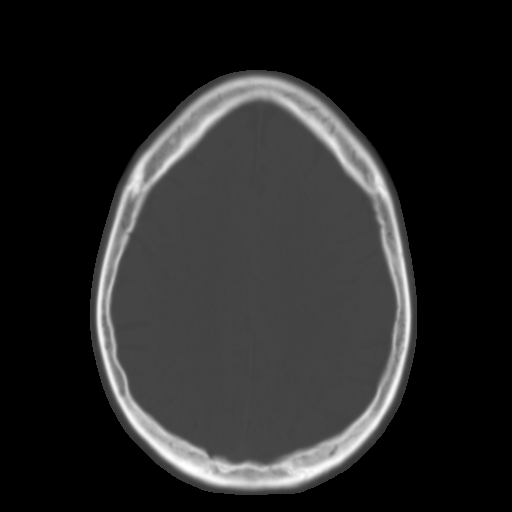
[im 25/34  brain]
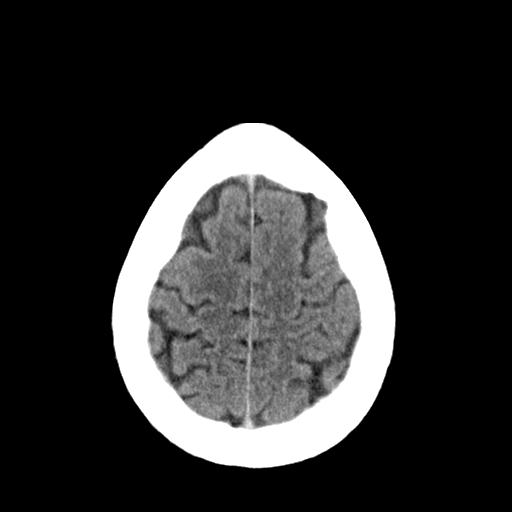
[im 29/34  brain]
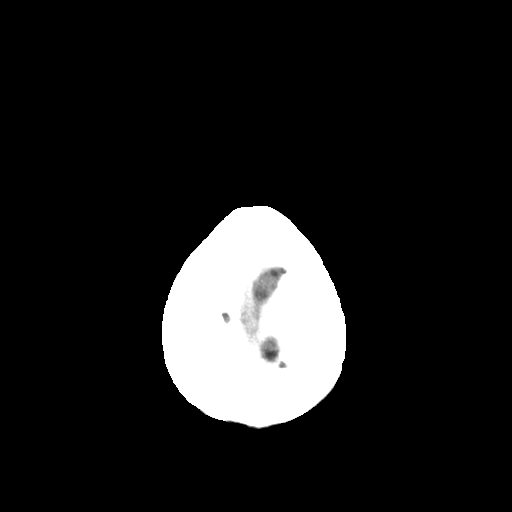

[Series 4: coronal soft · coronal · 0.29mm/px · 3 of 71 slices shown]
[im 24/71  brain]
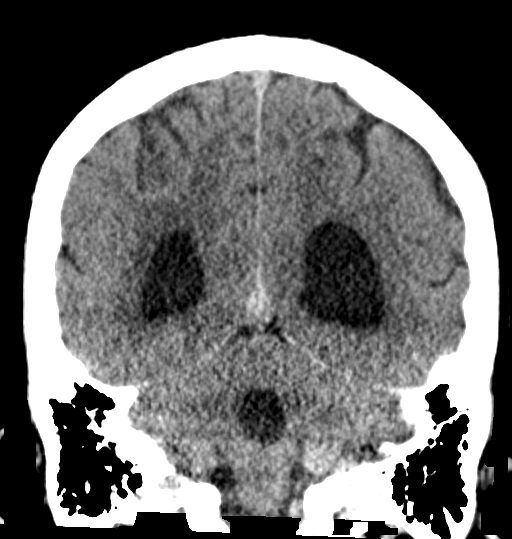
[im 32/71  brain]
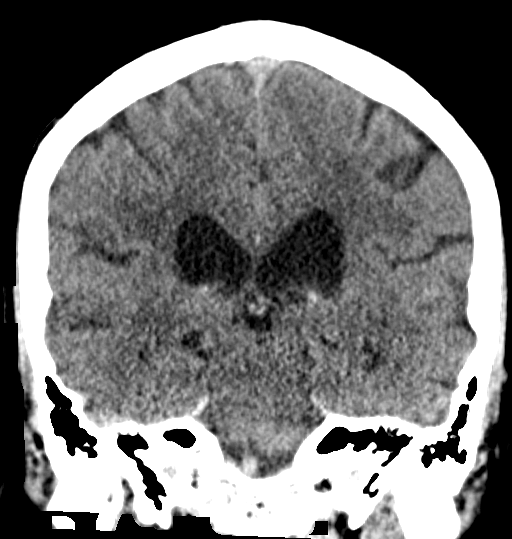
[im 39/71  brain]
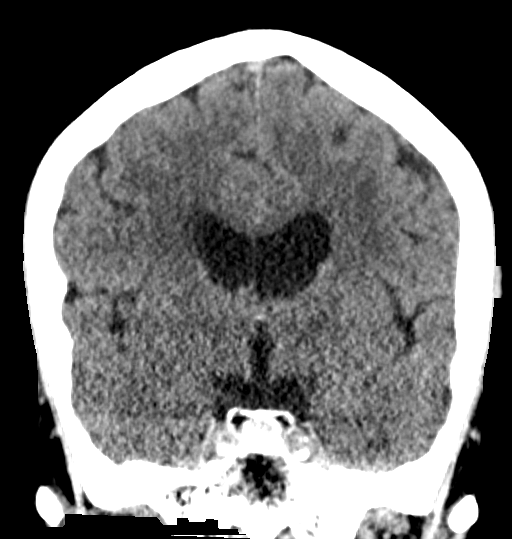

[Series 5: sagittal soft · sagittal · 0.31mm/px · 3 of 50 slices shown]
[im 17/50  brain]
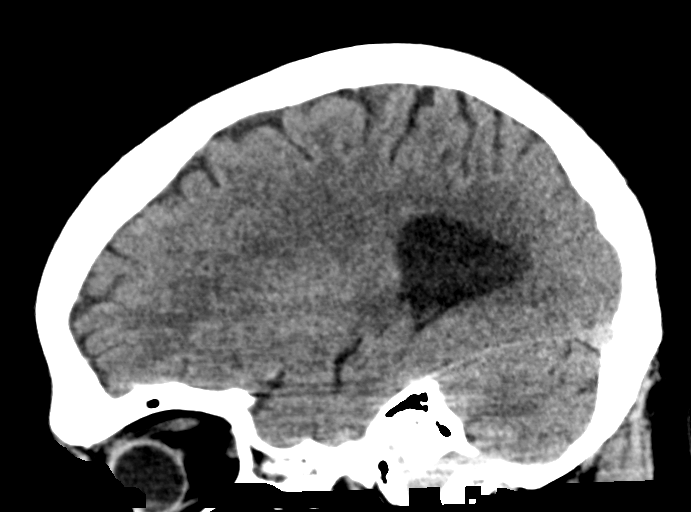
[im 25/50  brain]
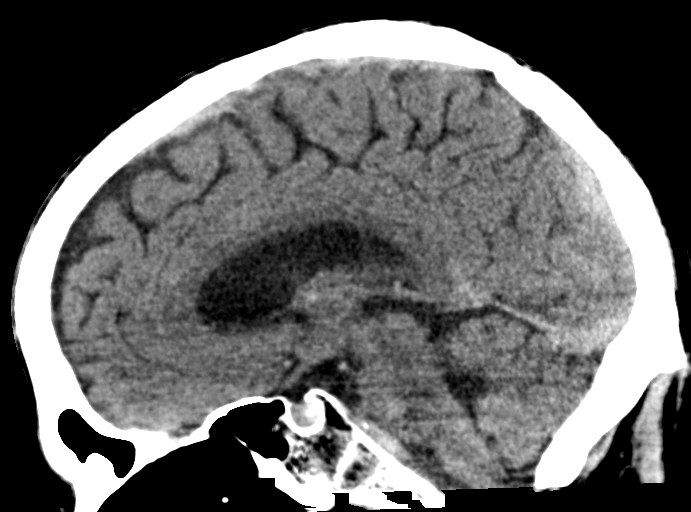
[im 33/50  brain]
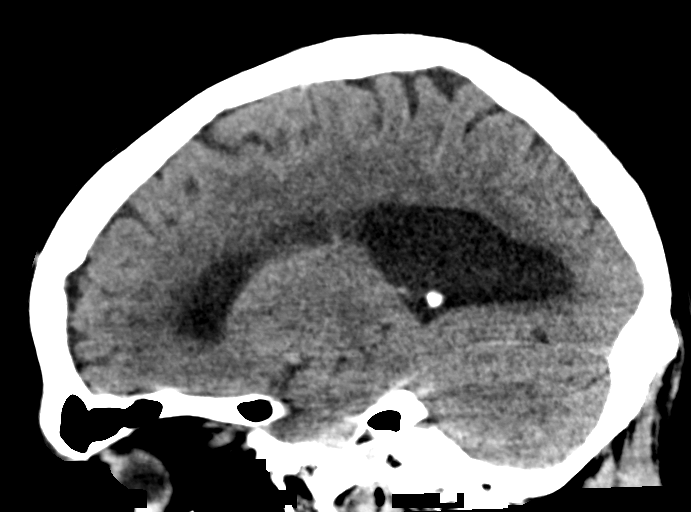

[15 of 47 positions shown; findings below may reference images not displayed]

FINDINGS: CT HEAD FINDINGS

BRAIN:
BRAIN
Pprominence of the lateral ventricles may be related to central
predominant atrophy, although a component of normal
pressure/communicating hydrocephalus cannot be excluded. Patchy and
confluent areas of decreased attenuation are noted throughout the
deep and periventricular white matter of the cerebral hemispheres
bilaterally, compatible with chronic microvascular ischemic disease.

No evidence of large-territorial acute infarction. No parenchymal
hemorrhage. Enlarged pituitary gland measuring up to 1 cm with
hyperdensity associated posteriorly. No extra-axial collection.

No mass effect or midline shift. No hydrocephalus. Basilar cisterns
are patent.

Vascular: No hyperdense vessel. Atherosclerotic calcifications are
present within the cavernous internal carotid arteries.

Skull: No acute fracture or focal lesion.

Sinuses/Orbits: Paranasal sinuses and mastoid air cells are clear.
The orbits are unremarkable.

Other: None.

CT CERVICAL SPINE FINDINGS

Alignment: Normal.

Skull base and vertebrae: Multilevel degenerative changes of the
spine. No acute fracture. No aggressive appearing focal osseous
lesion or focal pathologic process.

Soft tissues and spinal canal: No prevertebral fluid or swelling. No
visible canal hematoma.

Upper chest: Biapical pleural/pulmonary scarring. Possible mild
emphysematous changes.

Other: None.
IMPRESSION: 1. Prominence of the lateral ventricles may be related to central
predominant atrophy, although a component of normal
pressure/communicating hydrocephalus cannot be excluded.
2. Enlarged pituitary gland measuring up to 1 cm. Consider
nonemergent MRI sella protocol further evaluation if clinically
indicated.
3. No acute intracranial abnormality.
4. No acute displaced fracture or traumatic listhesis of the
cervical spine.

## 2021-01-20 IMAGING — CT CT CERVICAL SPINE W/O CM
3 of 4 series · 13 of 33 positions shown, 15 images · non-contrast
Comparison: None.

CLINICAL DATA: Head trauma, coagulopathy (Age 18-64y) warfarin,
loc; fall, coagulopathic, loc

EXAM:
CT HEAD WITHOUT CONTRAST
CT CERVICAL SPINE WITHOUT CONTRAST
TECHNIQUE: Multidetector CT imaging of the head and cervical spine was
performed following the standard protocol without intravenous
contrast. Multiplanar CT image reconstructions of the cervical spine
were also generated.

[Series 4: c spine soft · axial · 0.36mm/px · z∈[-259,-137]mm · 5 of 93 slices shown]
[im 16/93  soft-tissue]
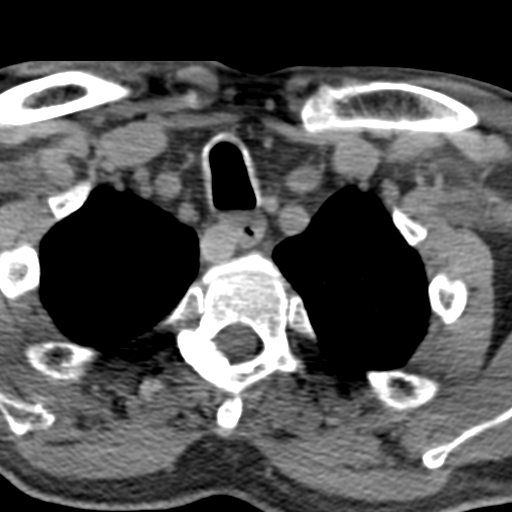
[im 31/93  soft-tissue]
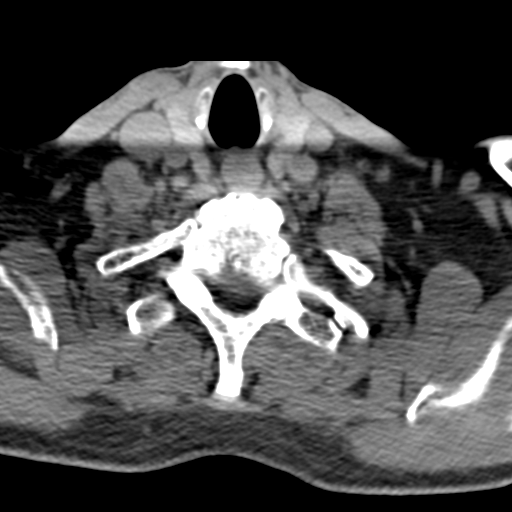
[im 47/93  soft-tissue]
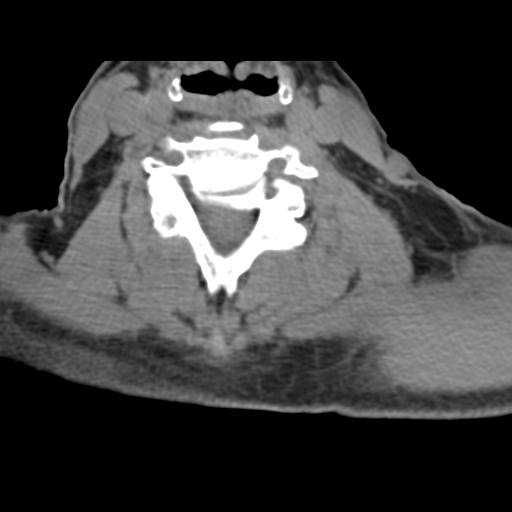
[im 62/93  soft-tissue]
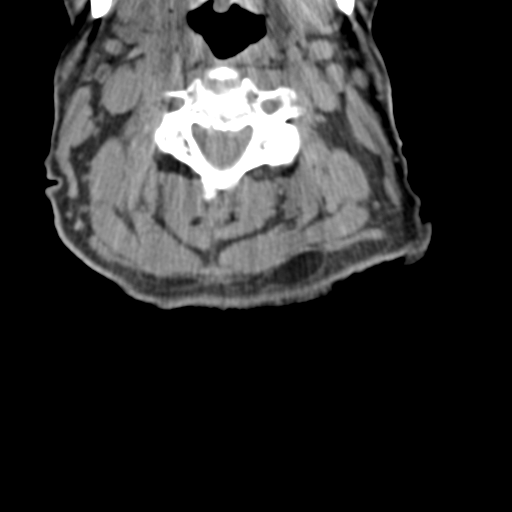
[im 77/93  soft-tissue]
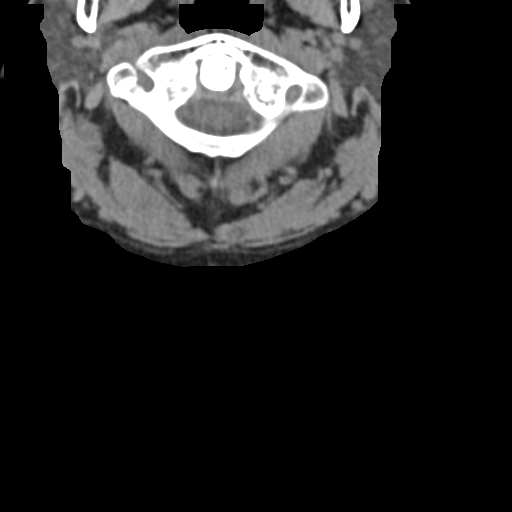

[Series 5: cor bone · coronal · 0.23mm/px · 3 of 58 slices shown]
[im 15/58  bone]
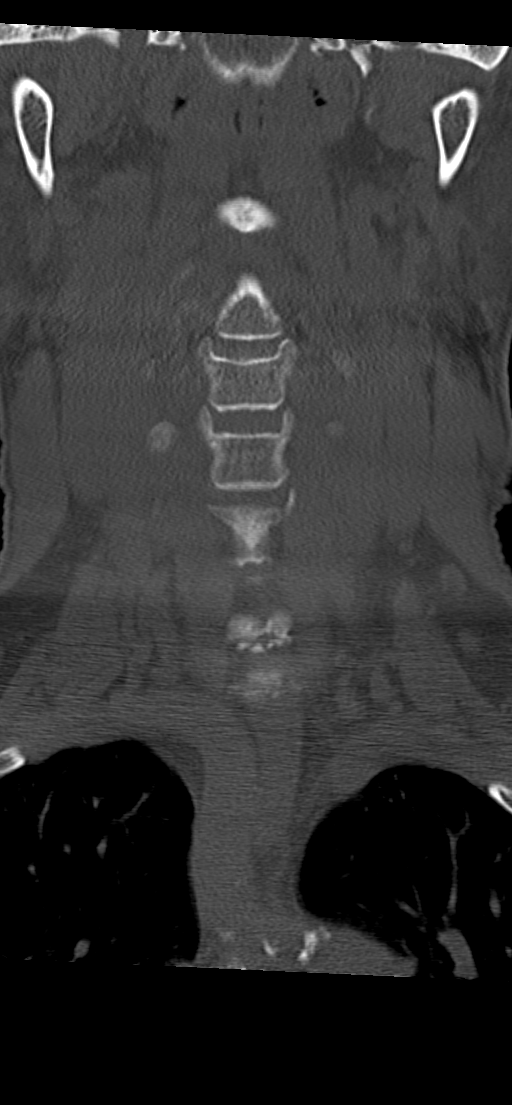
[im 24/58  bone]
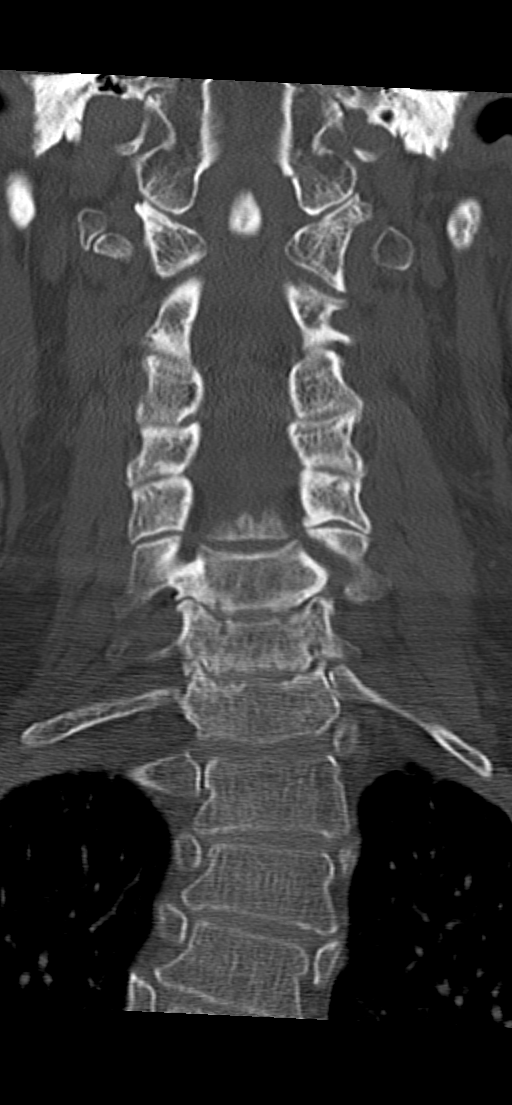
[im 34/58  bone]
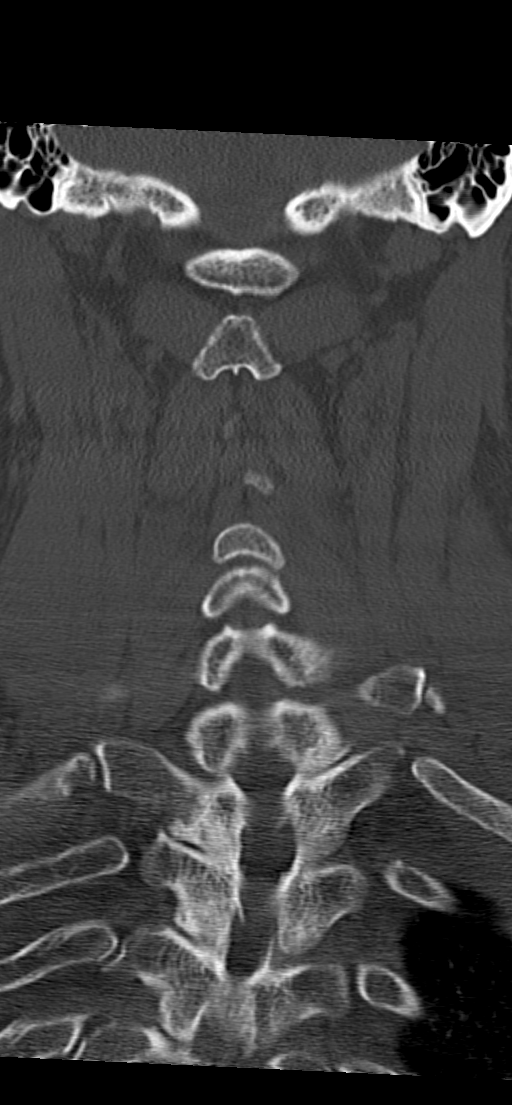

[Series 7: orthogonal axials · axial · 0.21mm/px · z∈[-274,-162]mm · 5 of 106 slices shown, 7 images]
[im 18/106  soft-tissue]
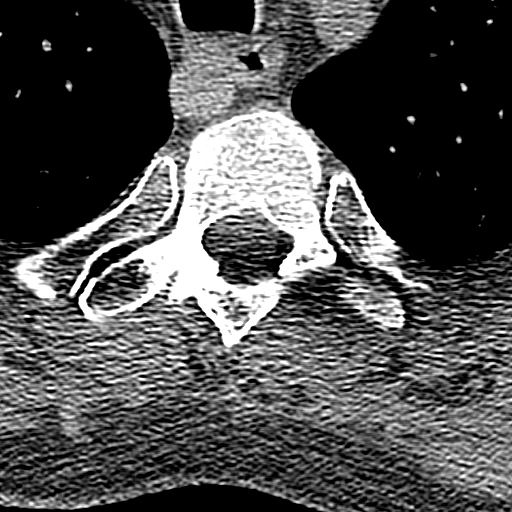
[im 18/106  bone]
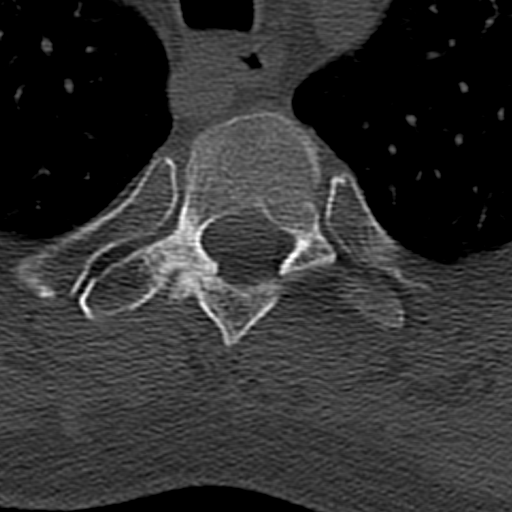
[im 36/106  bone]
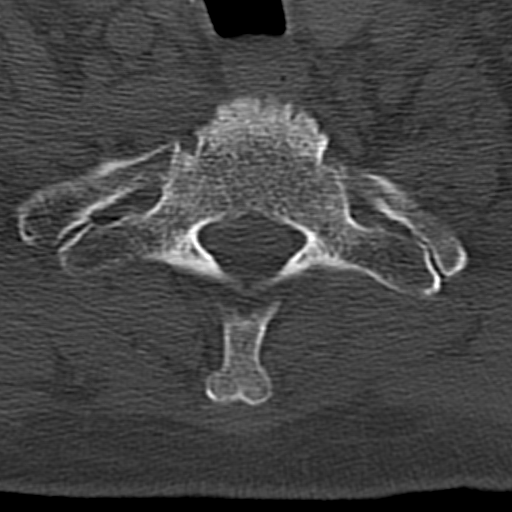
[im 53/106  bone]
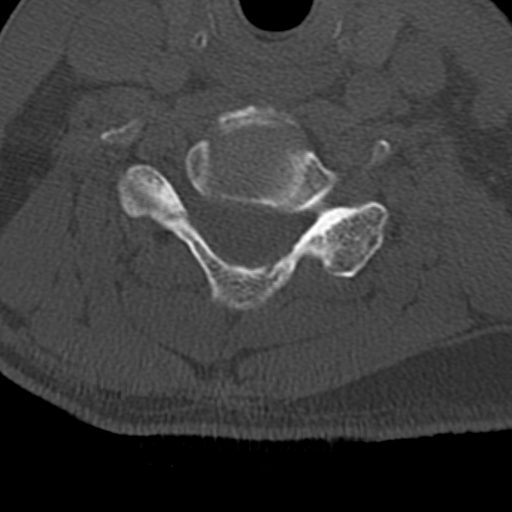
[im 71/106  bone]
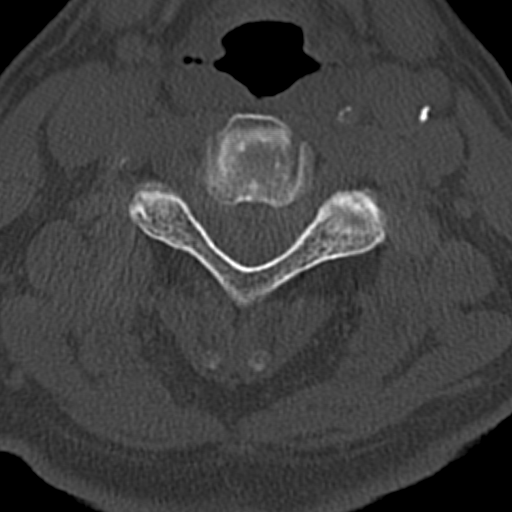
[im 88/106  soft-tissue]
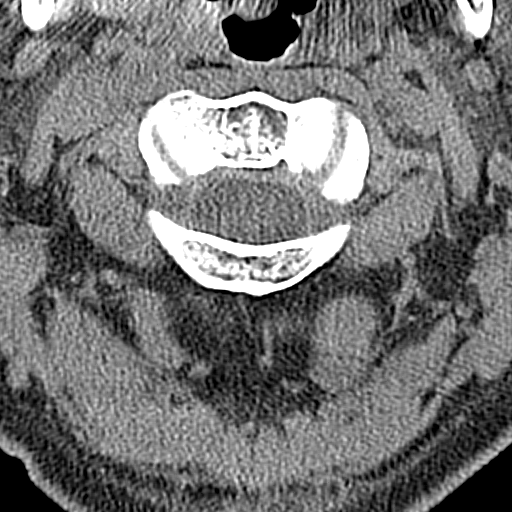
[im 88/106  bone]
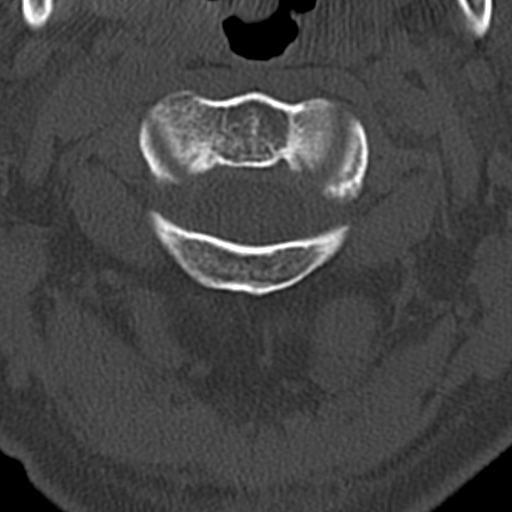

[13 of 33 positions shown; findings below may reference images not displayed]

FINDINGS: CT HEAD FINDINGS

BRAIN:
BRAIN
Pprominence of the lateral ventricles may be related to central
predominant atrophy, although a component of normal
pressure/communicating hydrocephalus cannot be excluded. Patchy and
confluent areas of decreased attenuation are noted throughout the
deep and periventricular white matter of the cerebral hemispheres
bilaterally, compatible with chronic microvascular ischemic disease.

No evidence of large-territorial acute infarction. No parenchymal
hemorrhage. Enlarged pituitary gland measuring up to 1 cm with
hyperdensity associated posteriorly. No extra-axial collection.

No mass effect or midline shift. No hydrocephalus. Basilar cisterns
are patent.

Vascular: No hyperdense vessel. Atherosclerotic calcifications are
present within the cavernous internal carotid arteries.

Skull: No acute fracture or focal lesion.

Sinuses/Orbits: Paranasal sinuses and mastoid air cells are clear.
The orbits are unremarkable.

Other: None.

CT CERVICAL SPINE FINDINGS

Alignment: Normal.

Skull base and vertebrae: Multilevel degenerative changes of the
spine. No acute fracture. No aggressive appearing focal osseous
lesion or focal pathologic process.

Soft tissues and spinal canal: No prevertebral fluid or swelling. No
visible canal hematoma.

Upper chest: Biapical pleural/pulmonary scarring. Possible mild
emphysematous changes.

Other: None.
IMPRESSION: 1. Prominence of the lateral ventricles may be related to central
predominant atrophy, although a component of normal
pressure/communicating hydrocephalus cannot be excluded.
2. Enlarged pituitary gland measuring up to 1 cm. Consider
nonemergent MRI sella protocol further evaluation if clinically
indicated.
3. No acute intracranial abnormality.
4. No acute displaced fracture or traumatic listhesis of the
cervical spine.

## 2021-01-20 MED ORDER — SODIUM CHLORIDE 0.9 % IV BOLUS
500.0000 mL | Freq: Once | INTRAVENOUS | Status: AC
  Administered 2021-01-20: 22:00:00 500 mL via INTRAVENOUS

## 2021-01-20 NOTE — ED Triage Notes (Signed)
Pt mentions L arm weakness "on and off for months".

## 2021-01-20 NOTE — ED Provider Notes (Signed)
60 yo M with a chief complaints of syncopal event.  This was concerning as the patient had no prodrome.  He was making coffee on Saturday morning.  Since then he has not been quite right has felt a bit off and having headaches.  CT of the head here is negative.  Plan for admission for high risk syncope.  Troponin negative.  EKG without concerning finding.   Melene Plan, DO 01/21/21 [phone]

## 2021-01-20 NOTE — Plan of Care (Addendum)
[facility]: 60 yo M Pt with syncope.  Unsteady since that time.  CT head and neck is neg.  Admitting for syncope workup.  On further review: pt with extensive history of recurrent DVTs, currently on coumadin.  While waiting for bed to be available / waiting for transfer, EDP getting CTA chest to r/o PE as well given very extensive history of acute and chronic DVTs as demonstrated most recently on 12/25/20 [place] (B DVTs including occlusive but looked chronic).  See also heme/onc office note from 3 days ago.  [facility] will assume care on arrival to accepting facility. Until arrival, care as per EDP. However, [facility] available 24/7 for questions and assistance.  Nursing staff, please page [facility] Admits and Consults [phone]) as soon as the patient arrives the hospital.

## 2021-01-20 NOTE — ED Triage Notes (Signed)
Pt states he had a syncopal episode on Saturday morning and fell hitting his head. He states he is on blood thinners. He reports feeling "hungover" since the fall, and like his balance is "off". EDP called to triage to [facility]. Pt reports taking sl ntg after the fall "because I thought it might have been because of a heart issue". No new falls since Saturday am. Pt taking warfarin.

## 2021-01-20 NOTE — ED Provider Notes (Signed)
[facility] EMERGENCY DEPT Provider Note   CSN: [id number] Arrival date & time: 01/20/21  [id number]     History Chief Complaint  Patient presents with   Loss of Consciousness   Fall on thinners    Craig Hahn is a 60 y.o. male.  This is a 60 y.o. male with significant medical history as below, including DVT on warfarin who presents to the ED with complaint of LOC.  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  He was ambulatory after this event.  He did hit his head on the ground.  Right side of his parietal region.  He was able to ambulate after the event but has been feeling somewhat lethargic, off balance over the past couple days.  He has been having intermittent chills and felt "shocky."  No nausea or vomiting.  He has been having headaches.  No numbness or tingling.  No medications prior to arrival.  Reports that his last INR level was over 4.  Saw hematology on Friday.  He did not report to the emergency department after this fall on Saturday and is here today because his PCP told him to come in to be evaluated earlier today.   The history is provided by the patient. No language interpreter was used.  Loss of Consciousness Associated symptoms: weakness   Associated symptoms: no chest pain, no confusion, no fever, no headaches, no nausea, no palpitations, no shortness of breath and no vomiting       Past Medical History:  Diagnosis Date   Allergy    Anxiety    Clotting disorder ([place])    Unsure if has ever been worked up, per pet has had 9 clots in the past, last was in 2006, both brothers also have clotting issues   Depression    DVT (deep venous thrombosis) ([place])     Patient Active Problem List   Diagnosis Date Noted   Syncope 01/20/2021   Clotting disorder ([place]) 01/21/2017   Encounter for therapeutic drug monitoring 04/07/2016   Alcohol abuse, daily use 12/20/2015   LPRD (laryngopharyngeal reflux  disease) 12/20/2015   DVT (deep venous thrombosis) ([place]) 11/23/2013    Past Surgical History:  Procedure Laterality Date   dental implants     x8       Family History  Problem Relation Age of Onset   Heart attack Father    Clotting disorder Brother    Clotting disorder Brother     Social History   Tobacco Use   Smoking status: Former    Packs/day: 1.00    Types: E-cigarettes, Cigarettes    Quit date: 01/14/2017    Years since quitting: 4.0   Smokeless tobacco: Never  Vaping Use   Vaping Use: Never used  Substance Use Topics   Alcohol use: Yes    Comment: daily 4 beers    Drug use: Yes    Types: Marijuana    Comment: "medical marijuana"    Home Medications Prior to Admission medications   Medication Sig Start Date End Date Taking? Authorizing Provider  Ascorbic Acid (VITAMIN C PO) Take 1 tablet by mouth daily.   Yes [provider]  Cyanocobalamin (VITAMIN B-12 PO) Take 1 tablet by mouth daily.   Yes [provider]  ferrous sulfate 324 MG TBEC Take 324 mg by mouth daily.   Yes [provider]  MAGNESIUM PO Take 1 tablet by mouth daily.  Yes [provider]  Multiple Vitamins-Minerals (B-REDI/RED HEARTS/RED ROOSTERS PO) Take 1 tablet by mouth daily.   Yes [provider]  Multiple Vitamins-Minerals (ZINC PO) Take 1 tablet by mouth daily.   Yes [provider]  nitroGLYCERIN (NITROSTAT) 0.4 MG SL tablet Place 1 tablet (0.4 mg total) under the tongue every 5 (five) minutes as needed for chest pain. 08/26/20 03/15/21 Yes Verta Ellen., NP  Omega-3 Fatty Acids (FISH OIL PO) Take 1 capsule by mouth daily.   Yes [provider]  warfarin (COUMADIN) 5 MG tablet Take 10 mg by mouth daily. 12/27/20  Yes [provider]    Allergies    Codeine, Penicillins, and Sulfa antibiotics  Review of Systems   Review of Systems  Constitutional:  Positive for fatigue. Negative for chills and fever.  HENT:   Negative for facial swelling and trouble swallowing.   Eyes:  Negative for photophobia and visual disturbance.  Respiratory:  Negative for cough and shortness of breath.   Cardiovascular:  Positive for syncope. Negative for chest pain and palpitations.  Gastrointestinal:  Negative for abdominal pain, nausea and vomiting.  Endocrine: Negative for polydipsia and polyuria.  Genitourinary:  Negative for difficulty urinating and hematuria.  Musculoskeletal:  Negative for gait problem and joint swelling.  Skin:  Negative for pallor and rash.  Neurological:  Positive for syncope and weakness. Negative for headaches.  Psychiatric/Behavioral:  Negative for agitation and confusion.    Physical Exam Updated Vital Signs BP 124/71 (BP Location: Left Arm)    Pulse 64    Temp 98 F (36.7 C) (Oral)    Resp 18    Ht 6\' 1"  (1.854 m)    Wt 76.4 kg    SpO2 95%    BMI 22.22 kg/m   Physical Exam Vitals and nursing note reviewed.  Constitutional:      General: He is not in acute distress.    Appearance: He is well-developed.  HENT:     Head: Normocephalic and atraumatic.     Right Ear: External ear normal.     Left Ear: External ear normal.     Mouth/Throat:     Mouth: Mucous membranes are moist.  Eyes:     General: No scleral icterus.    Extraocular Movements: Extraocular movements intact.     Pupils: Pupils are equal, round, and reactive to light.  Cardiovascular:     Rate and Rhythm: Normal rate and regular rhythm.     Pulses: Normal pulses.     Heart sounds: Normal heart sounds.  Pulmonary:     Effort: Pulmonary effort is normal. No respiratory distress.     Breath sounds: Normal breath sounds.  Abdominal:     General: Abdomen is flat.     Palpations: Abdomen is soft.     Tenderness: There is no abdominal tenderness.  Musculoskeletal:        General: Normal range of motion.     Cervical back: Normal range of motion.     Right lower leg: No edema.     Left lower leg: No edema.  Skin:     General: Skin is warm and dry.     Capillary Refill: Capillary refill takes less than 2 seconds.  Neurological:     Mental Status: He is alert and oriented to person, place, and time.     GCS: GCS eye subscore is 4. GCS verbal subscore is 5. GCS motor subscore is 6.  Cranial Nerves: Cranial nerves 2-12 are intact.     Sensory: Sensation is intact.     Motor: Motor function is intact. No weakness or tremor.     Coordination: Coordination is intact.  Psychiatric:        Mood and Affect: Mood normal.        Behavior: Behavior normal.    ED Results / Procedures / Treatments   Labs (all labs ordered are listed, but only abnormal results are displayed) Labs Reviewed  COMPREHENSIVE METABOLIC PANEL - Abnormal; Notable for the following components:      Result Value   Glucose, Bld 111 (*)    All other components within normal limits  PROTIME-INR - Abnormal; Notable for the following components:   Prothrombin Time 36.8 (*)    INR 3.7 (*)    All other components within normal limits  COMPREHENSIVE METABOLIC PANEL - Abnormal; Notable for the following components:   Calcium 8.5 (*)    All other components within normal limits  PROTIME-INR - Abnormal; Notable for the following components:   Prothrombin Time 37.0 (*)    INR 3.7 (*)    All other components within normal limits  URINALYSIS, ROUTINE W REFLEX MICROSCOPIC - Abnormal; Notable for the following components:   Specific Gravity, Urine >1.046 (*)    Ketones, ur 5 (*)    All other components within normal limits  RESP PANEL BY RT-PCR (FLU A&B, COVID) ARPGX2  CBC WITH DIFFERENTIAL/PLATELET  HIV ANTIBODY (ROUTINE TESTING W REFLEX)  MAGNESIUM  CBC WITH DIFFERENTIAL/PLATELET  ETHANOL  RAPID URINE DRUG SCREEN, HOSP PERFORMED  PROLACTIN  PROTIME-INR  CBG MONITORING, ED  TROPONIN I (HIGH SENSITIVITY)  TROPONIN I (HIGH SENSITIVITY)  TROPONIN I (HIGH SENSITIVITY)    EKG EKG Interpretation  Date/Time:  Monday January 20 2021  22:26:51 EST Ventricular Rate:  50 PR Interval:  219 QRS Duration: 76 QT Interval:  447 QTC Calculation: 408 R Axis:   51 Text Interpretation: Sinus rhythm Borderline prolonged PR interval Probable anteroseptal infarct, old Borderline T abnormalities, inferior leads no wpw, prolonged qt or brugada Otherwise no significant change Confirmed by Melene Plan [phone]) on 01/20/2021 11:07:17 PM Also confirmed by Melene Plan [phone]), editor Matthews, LaVerne ([id number])  on 01/21/2021 7:21:04 AM  Radiology CT Head Wo Contrast  Result Date: 01/20/2021 CLINICAL DATA:  Head trauma, coagulopathy (Age 53-64y) warfarin, loc; fall, coagulopathic, loc EXAM: CT HEAD WITHOUT CONTRAST CT CERVICAL SPINE WITHOUT CONTRAST TECHNIQUE: Multidetector CT imaging of the head and cervical spine was performed following the standard protocol without intravenous contrast. Multiplanar CT image reconstructions of the cervical spine were also generated. COMPARISON:  None. FINDINGS: CT HEAD FINDINGS BRAIN: BRAIN Pprominence of the lateral ventricles may be related to central predominant atrophy, although a component of normal pressure/communicating hydrocephalus cannot be excluded. Patchy and confluent areas of decreased attenuation are noted throughout the deep and periventricular white matter of the cerebral hemispheres bilaterally, compatible with chronic microvascular ischemic disease. No evidence of large-territorial acute infarction. No parenchymal hemorrhage. Enlarged pituitary gland measuring up to 1 cm with hyperdensity associated posteriorly. No extra-axial collection. No mass effect or midline shift. No hydrocephalus. Basilar cisterns are patent. Vascular: No hyperdense vessel. Atherosclerotic calcifications are present within the cavernous internal carotid arteries. Skull: No acute fracture or focal lesion. Sinuses/Orbits: Paranasal sinuses and mastoid air cells are clear. The orbits are unremarkable. Other: None. CT CERVICAL SPINE  FINDINGS Alignment: Normal. Skull base and vertebrae: Multilevel degenerative changes of the spine. No acute fracture.  No aggressive appearing focal osseous lesion or focal pathologic process. Soft tissues and spinal canal: No prevertebral fluid or swelling. No visible canal hematoma. Upper chest: Biapical pleural/pulmonary scarring. Possible mild emphysematous changes. Other: None. IMPRESSION: 1. Prominence of the lateral ventricles may be related to central predominant atrophy, although a component of normal pressure/communicating hydrocephalus cannot be excluded. 2. Enlarged pituitary gland measuring up to 1 cm. Consider nonemergent MRI sella protocol further evaluation if clinically indicated. 3. No acute intracranial abnormality. 4. No acute displaced fracture or traumatic listhesis of the cervical spine. Electronically Signed   By: Iven Finn M.D.   On: 01/20/2021 20:26   CT Angio Chest PE W and/or Wo Contrast  Result Date: 01/21/2021 CLINICAL DATA:  Pulmonary embolism (PE) suspected, high prob. Left arm weakness. EXAM: CT ANGIOGRAPHY CHEST WITH CONTRAST TECHNIQUE: Multidetector CT imaging of the chest was performed using the standard protocol during bolus administration of intravenous contrast. Multiplanar CT image reconstructions and MIPs were obtained to evaluate the vascular anatomy. CONTRAST:  13mL OMNIPAQUE IOHEXOL 350 MG/ML SOLN COMPARISON:  04/12/2020 FINDINGS: Cardiovascular: Adequate opacification of the pulmonary arterial tree. No intraluminal filling defect identified to suggest acute pulmonary embolism. Central pulmonary arteries are of normal caliber. No significant coronary artery calcification. Cardiac size within normal limits. No pericardial effusion. Mild atherosclerotic calcification within the thoracic aorta. No aortic aneurysm. 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 Mediastinum/Nodes: No enlarged mediastinal, hilar, or axillary lymph nodes. Thyroid gland, trachea,  and esophagus demonstrate no significant findings. Lungs/Pleura: Lungs are clear. No pleural effusion or pneumothorax. Upper Abdomen: No acute abnormality. Musculoskeletal: No chest wall abnormality. No acute or significant osseous findings. Review of the MIP images confirms the above findings. IMPRESSION: No pulmonary embolism.  No acute intrathoracic pathology identified. Aortic Atherosclerosis (ICD10-I70.0). Electronically Signed   By: Fidela Salisbury M.D.   On: 01/21/2021 00:45   CT Cervical Spine Wo Contrast  Result Date: 01/20/2021 CLINICAL DATA:  Head trauma, coagulopathy (Age 81-64y) warfarin, loc; fall, coagulopathic, loc EXAM: CT HEAD WITHOUT CONTRAST CT CERVICAL SPINE WITHOUT CONTRAST TECHNIQUE: Multidetector CT imaging of the head and cervical spine was performed following the standard protocol without intravenous contrast. Multiplanar CT image reconstructions of the cervical spine were also generated. COMPARISON:  None. FINDINGS: CT HEAD FINDINGS BRAIN: BRAIN Pprominence of the lateral ventricles may be related to central predominant atrophy, although a component of normal pressure/communicating hydrocephalus cannot be excluded. Patchy and confluent areas of decreased attenuation are noted throughout the deep and periventricular white matter of the cerebral hemispheres bilaterally, compatible with chronic microvascular ischemic disease. No evidence of large-territorial acute infarction. No parenchymal hemorrhage. Enlarged pituitary gland measuring up to 1 cm with hyperdensity associated posteriorly. No extra-axial collection. No mass effect or midline shift. No hydrocephalus. Basilar cisterns are patent. Vascular: No hyperdense vessel. Atherosclerotic calcifications are present within the cavernous internal carotid arteries. Skull: No acute fracture or focal lesion. Sinuses/Orbits: Paranasal sinuses and mastoid air cells are clear. The orbits are unremarkable. Other: None. CT CERVICAL SPINE FINDINGS  Alignment: Normal. Skull base and vertebrae: Multilevel degenerative changes of the spine. No acute fracture. No aggressive appearing focal osseous lesion or focal pathologic process. Soft tissues and spinal canal: No prevertebral fluid or swelling. No visible canal hematoma. Upper chest: Biapical pleural/pulmonary scarring. Possible mild emphysematous changes. Other: None. IMPRESSION: 1. Prominence of the lateral ventricles may be related to central predominant atrophy, although a component of normal pressure/communicating hydrocephalus cannot be excluded. 2. Enlarged pituitary gland measuring  up to 1 cm. Consider nonemergent MRI sella protocol further evaluation if clinically indicated. 3. No acute intracranial abnormality. 4. No acute displaced fracture or traumatic listhesis of the cervical spine. Electronically Signed   By: Iven Finn M.D.   On: 01/20/2021 20:26   DG Chest Portable 1 View  Result Date: 01/20/2021 CLINICAL DATA:  Loss of consciousness. EXAM: PORTABLE CHEST 1 VIEW COMPARISON:  Chest x-ray 04/12/2020, CT chest 04/12/2020 FINDINGS: The heart and mediastinal contours are unchanged. Aortic calcification. No focal consolidation. No pulmonary edema. No pleural effusion. No pneumothorax. No acute osseous abnormality. IMPRESSION: No active disease. Electronically Signed   By: Iven Finn M.D.   On: 01/20/2021 20:13   ECHOCARDIOGRAM COMPLETE  Result Date: 01/21/2021    ECHOCARDIOGRAM REPORT   Patient Name:   Craig Hahn Date of Exam: 01/21/2021 Medical Rec #:  [id number]    Height:       73.0 in Accession #:    [id number]   Weight:       168.4 lb Date of Birth:  1960/03/30    BSA:          2.000 m Patient Age:    59 years     BP:           122/66 mmHg Patient Gender: M            HR:           47 bpm. Exam Location:  Inpatient Procedure: 2D Echo, Cardiac Doppler, Color Doppler and 3D Echo Indications:    R55 Syncope  History:        Patient has prior history of Echocardiogram  examinations, most                 recent 08/20/2020. Arrythmias:Bradycardia;                 Signs/Symptoms:Syncope.  Sonographer:    Glo Herring Referring Phys: [id number] [place]  1. Left ventricular ejection fraction, by estimation, is 55 to 60%. The left ventricle has normal function. The left ventricle has no regional wall motion abnormalities. Left ventricular diastolic parameters were normal.  2. Right ventricular systolic function is normal. The right ventricular size is normal. There is normal pulmonary artery systolic pressure. The estimated right ventricular systolic pressure is 0000000 mmHg.  3. The mitral valve is normal in structure. Trivial mitral valve regurgitation. No evidence of mitral stenosis.  4. The aortic valve is tricuspid. Aortic valve regurgitation is not visualized. No aortic stenosis is present.  5. The inferior vena cava is dilated in size with >50% respiratory variability, suggesting right atrial pressure of 8 mmHg. Comparison(s): No significant change from prior study. Conclusion(s)/Recommendation(s): Otherwise normal echocardiogram, with minor abnormalities described in the report. FINDINGS  Left Ventricle: Left ventricular ejection fraction, by estimation, is 55 to 60%. The left ventricle has normal function. The left ventricle has no regional wall motion abnormalities. The left ventricular internal cavity size was normal in size. There is  no left ventricular hypertrophy. Left ventricular diastolic parameters were normal. Right Ventricle: The right ventricular size is normal. No increase in right ventricular wall thickness. Right ventricular systolic function is normal. There is normal pulmonary artery systolic pressure. The tricuspid regurgitant velocity is 1.75 m/s, and  with an assumed right atrial pressure of 8 mmHg, the estimated right ventricular systolic pressure is 0000000 mmHg. Left Atrium: Left atrial size was normal in size. Right Atrium: Right atrial  size was normal in  size. Pericardium: There is no evidence of pericardial effusion. Mitral Valve: Borderline MV bowing, no prolapse. The mitral valve is normal in structure. Trivial mitral valve regurgitation. No evidence of mitral valve stenosis. Tricuspid Valve: The tricuspid valve is normal in structure. Tricuspid valve regurgitation is trivial. No evidence of tricuspid stenosis. Aortic Valve: The aortic valve is tricuspid. Aortic valve regurgitation is not visualized. No aortic stenosis is present. Aortic valve mean gradient measures 4.0 mmHg. Aortic valve peak gradient measures 7.1 mmHg. Aortic valve area, by VTI measures 2.72 cm. Pulmonic Valve: The pulmonic valve was not well visualized. Pulmonic valve regurgitation is not visualized. Aorta: The aortic root, ascending aorta, aortic arch and descending aorta are all structurally normal, with no evidence of dilitation or obstruction. Venous: The inferior vena cava is dilated in size with greater than 50% respiratory variability, suggesting right atrial pressure of 8 mmHg. IAS/Shunts: The atrial septum is grossly normal.  LEFT VENTRICLE PLAX 2D LVIDd:         4.85 cm   Diastology LVIDs:         3.45 cm   LV e' medial:    6.31 cm/s LV PW:         1.05 cm   LV E/e' medial:  9.9 LV IVS:        1.05 cm   LV e' lateral:   10.90 cm/s LVOT diam:     2.20 cm   LV E/e' lateral: 5.7 LV SV:         81 LV SV Index:   40 LVOT Area:     3.80 cm  RIGHT VENTRICLE             IVC RV Basal diam:  4.00 cm     IVC diam: 2.30 cm RV S prime:     11.20 cm/s LEFT ATRIUM             Index        RIGHT ATRIUM           Index LA diam:        3.15 cm 1.57 cm/m   RA Area:     16.50 cm LA Vol (A2C):   57.6 ml 28.80 ml/m  RA Volume:   42.30 ml  21.15 ml/m LA Vol (A4C):   43.2 ml 21.60 ml/m LA Biplane Vol: 51.7 ml 25.85 ml/m  AORTIC VALVE                    PULMONIC VALVE AV Area (Vmax):    2.56 cm     PV Vmax:       0.81 m/s AV Area (Vmean):   2.41 cm     PV Peak grad:  2.6 mmHg AV  Area (VTI):     2.72 cm AV Vmax:           133.00 cm/s AV Vmean:          91.000 cm/s AV VTI:            0.296 m AV Peak Grad:      7.1 mmHg AV Mean Grad:      4.0 mmHg LVOT Vmax:         89.40 cm/s LVOT Vmean:        57.700 cm/s LVOT VTI:          0.212 m LVOT/AV VTI ratio: 0.72  AORTA Ao Root diam: 3.40 cm Ao Asc diam:  3.40 cm MITRAL VALVE  TRICUSPID VALVE MV Area (PHT): 3.63 cm    TR Peak grad:   12.2 mmHg MV Decel Time: 209 msec    TR Vmax:        175.00 cm/s MV E velocity: 62.60 cm/s MV A velocity: 57.80 cm/s  SHUNTS MV E/A ratio:  1.08        Systemic VTI:  0.21 m                            Systemic Diam: 2.20 cm Buford Dresser MD Electronically signed by Buford Dresser MD Signature Date/Time: 01/21/2021/4:12:19 PM    Final     Procedures Procedures   Medications Ordered in ED Medications  acetaminophen (TYLENOL) tablet 650 mg (has no administration in time range)    Or  acetaminophen (TYLENOL) suppository 650 mg (has no administration in time range)  sodium chloride 0.9 % bolus 500 mL (0 mLs Intravenous Stopped 01/20/21 2350)  iohexol (OMNIPAQUE) 350 MG/ML injection 80 mL (80 mLs Intravenous Contrast Given 01/21/21 0011)  sodium chloride 0.9 % 1,000 mL with thiamine 123XX123 mg, folic acid 1 mg, M.V.I. Adult 10 mL infusion ( Intravenous Stopped 01/21/21 1651)    ED Course  I have reviewed the triage vital signs and the nursing notes.  Pertinent labs & imaging results that were available during my care of the patient were reviewed by me and considered in my medical decision making (see chart for details).    MDM Rules/Calculators/A&P                          CC: syncope, head injury  This patient complains of above; this involves an extensive number of treatment options and is a complaint that carries with it a high risk of complications and morbidity. Vital signs were reviewed. Serious etiologies considered.  Record review:   Previous records obtained and  reviewed    Work up as above, notable for:  Labs & imaging results that were available during my care of the patient were reviewed by me and considered in my medical decision making.   I ordered imaging studies which included CXR, CTH and I independently visualized and interpreted imaging which showed no acute process, no stroke or intracranial hemorrhage.    CT cervical spine stable  Cardiac telemetry reviewed and interpreted by myself shows NSR  EKG without evidence of acute ischemia, troponin negative x1.  Chest x-ray negative.  Management: Given IVF and tylenol  Pt with syncope, given abrupt nature of his syncope, age, cardiac risk factors, lack of prodromal symptoms, there is high suspicion for cardiac etiology. Recommend admission for cardiology evaluation.   Patient also has supratherapeutic INR, recommend holding next warfarin dose.  No evidence of acute bleeding.  No need for reversal at this time.  Patient pending delta troponin at this time.  Patient care handed off to incoming provider pending admission.  Delta troponin        This chart was dictated using voice recognition software.  Despite best efforts to proofread,  errors can occur which can change the documentation meaning.    Final Clinical Impression(s) / ED Diagnoses Final diagnoses:  Syncope, unspecified syncope type  Supratherapeutic INR    Rx / DC Orders ED Discharge Orders     None        Jeanell Sparrow, DO 01/21/21 1923

## 2021-01-21 ENCOUNTER — Observation Stay (HOSPITAL_BASED_OUTPATIENT_CLINIC_OR_DEPARTMENT_OTHER): Payer: Self-pay

## 2021-01-21 ENCOUNTER — Encounter (HOSPITAL_BASED_OUTPATIENT_CLINIC_OR_DEPARTMENT_OTHER): Payer: Self-pay | Admitting: Internal Medicine

## 2021-01-21 ENCOUNTER — Emergency Department (HOSPITAL_BASED_OUTPATIENT_CLINIC_OR_DEPARTMENT_OTHER): Payer: Self-pay

## 2021-01-21 DIAGNOSIS — R55 Syncope and collapse: Secondary | ICD-10-CM

## 2021-01-21 DIAGNOSIS — F1010 Alcohol abuse, uncomplicated: Secondary | ICD-10-CM

## 2021-01-21 DIAGNOSIS — I825Z9 Chronic embolism and thrombosis of unspecified deep veins of unspecified distal lower extremity: Secondary | ICD-10-CM

## 2021-01-21 LAB — URINALYSIS, ROUTINE W REFLEX MICROSCOPIC
Bilirubin Urine: NEGATIVE
Glucose, UA: NEGATIVE mg/dL
Hgb urine dipstick: NEGATIVE
Ketones, ur: 5 mg/dL — AB
Leukocytes,Ua: NEGATIVE
Nitrite: NEGATIVE
Protein, ur: NEGATIVE mg/dL
Specific Gravity, Urine: >1.046 — ABNORMAL HIGH (ref 1.005–1.030)
pH: 5 (ref 5.0–8.0)

## 2021-01-21 LAB — RAPID URINE DRUG SCREEN, HOSP PERFORMED
Amphetamines: NOT DETECTED
Barbiturates: NOT DETECTED
Benzodiazepines: NOT DETECTED
Cocaine: NOT DETECTED
Opiates: NOT DETECTED
Tetrahydrocannabinol: NOT DETECTED

## 2021-01-21 LAB — ECHOCARDIOGRAM COMPLETE
AR max vel: 2.56 cm2
AV Area VTI: 2.72 cm2
AV Area mean vel: 2.41 cm2
AV Mean grad: 4 mmHg
AV Peak grad: 7.1 mmHg
Ao pk vel: 1.33 m/s
Area-P 1/2: 3.63 cm2
Height: 73 in
S' Lateral: 3.45 cm
Weight: 2694.9 oz

## 2021-01-21 LAB — COMPREHENSIVE METABOLIC PANEL
ALT: 27 U/L (ref 0–44)
AST: 25 U/L (ref 15–41)
Albumin: 3.6 g/dL (ref 3.5–5.0)
Alkaline Phosphatase: 46 U/L (ref 38–126)
Anion gap: 5 (ref 5–15)
BUN: 10 mg/dL (ref 8–23)
CO2: 26 mmol/L (ref 22–32)
Calcium: 8.5 mg/dL — ABNORMAL LOW (ref 8.9–10.3)
Chloride: 107 mmol/L (ref 98–111)
Creatinine, Ser: 0.8 mg/dL (ref 0.61–1.24)
GFR, Estimated: >60 mL/min (ref >60)
Glucose, Bld: 87 mg/dL (ref 70–99)
Potassium: 4 mmol/L (ref 3.5–5.1)
Sodium: 138 mmol/L (ref 135–145)
Total Bilirubin: 0.8 mg/dL (ref 0.3–1.2)
Total Protein: 6.5 g/dL (ref 6.5–8.1)

## 2021-01-21 LAB — DRVVT MIX: dRVVT Mix: 38.4 s (ref 0.0–40.4)

## 2021-01-21 LAB — CBC WITH DIFFERENTIAL/PLATELET
Abs Immature Granulocytes: 0.01 10*3/uL (ref 0.00–0.07)
Basophils Absolute: 0 10*3/uL (ref 0.0–0.1)
Basophils Relative: 1 %
Eosinophils Absolute: 0.2 10*3/uL (ref 0.0–0.5)
Eosinophils Relative: 4 %
HCT: 45.3 % (ref 39.0–52.0)
Hemoglobin: 15.6 g/dL (ref 13.0–17.0)
Immature Granulocytes: 0 %
Lymphocytes Relative: 34 %
Lymphs Abs: 1.8 10*3/uL (ref 0.7–4.0)
MCH: 32.9 pg (ref 26.0–34.0)
MCHC: 34.4 g/dL (ref 30.0–36.0)
MCV: 95.6 fL (ref 80.0–100.0)
Monocytes Absolute: 0.5 10*3/uL (ref 0.1–1.0)
Monocytes Relative: 10 %
Neutro Abs: 2.7 10*3/uL (ref 1.7–7.7)
Neutrophils Relative %: 51 %
Platelets: 196 10*3/uL (ref 150–400)
RBC: 4.74 MIL/uL (ref 4.22–5.81)
RDW: 13.3 % (ref 11.5–15.5)
WBC: 5.4 10*3/uL (ref 4.0–10.5)
nRBC: 0 % (ref 0.0–0.2)

## 2021-01-21 LAB — LUPUS ANTICOAGULANT PANEL
DRVVT: 62.1 s — ABNORMAL HIGH (ref 0.0–47.0)
PTT Lupus Anticoagulant: 59.7 s — ABNORMAL HIGH (ref 0.0–51.9)

## 2021-01-21 LAB — MAGNESIUM: Magnesium: 2.3 mg/dL (ref 1.7–2.4)

## 2021-01-21 LAB — ETHANOL: Alcohol, Ethyl (B): <10 mg/dL (ref <10)

## 2021-01-21 LAB — HIV ANTIBODY (ROUTINE TESTING W REFLEX): HIV Screen 4th Generation wRfx: NONREACTIVE

## 2021-01-21 LAB — PROTIME-INR
INR: 3.7 — ABNORMAL HIGH (ref 0.8–1.2)
Prothrombin Time: 37 seconds — ABNORMAL HIGH (ref 11.4–15.2)

## 2021-01-21 LAB — TROPONIN I (HIGH SENSITIVITY)
Troponin I (High Sensitivity): 4 ng/L (ref <18)
Troponin I (High Sensitivity): 6 ng/L (ref <18)

## 2021-01-21 LAB — PTT-LA MIX: PTT-LA Mix: 39.9 s (ref 0.0–48.9)

## 2021-01-21 LAB — PTT-LA INCUB MIX: PTT-LA Incub Mix: 43.4 s (ref 0.0–48.9)

## 2021-01-21 IMAGING — CT CT ANGIO CHEST
2 of 7 series · 13 of 36 positions shown · IV contrast (omnipaque)
Comparison: [DATE]

CLINICAL DATA: Pulmonary embolism (PE) suspected, high prob. Left
arm weakness.

EXAM:
CT ANGIOGRAPHY CHEST WITH CONTRAST
TECHNIQUE: Multidetector CT imaging of the chest was performed using the
standard protocol during bolus administration of intravenous
contrast. Multiplanar CT image reconstructions and MIPs were
obtained to evaluate the vascular anatomy.
CONTRAST:  80mL OMNIPAQUE IOHEXOL 350 MG/ML SOLN

[Series 7: pe axial thins · axial · 0.88mm/px · z∈[+806,+1113]mm · 12 of 363 slices shown]
[im 28/363  lung]
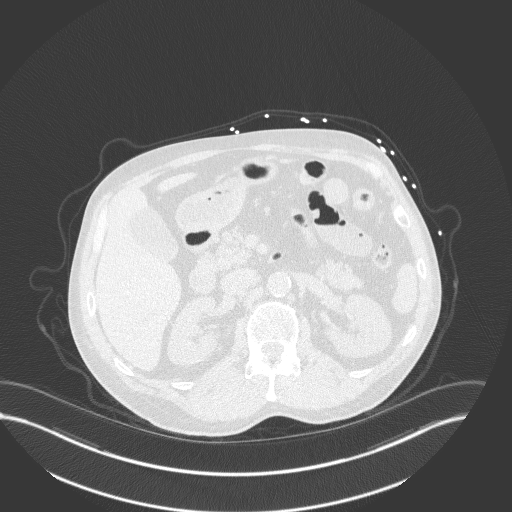
[im 56/363  mediastinal]
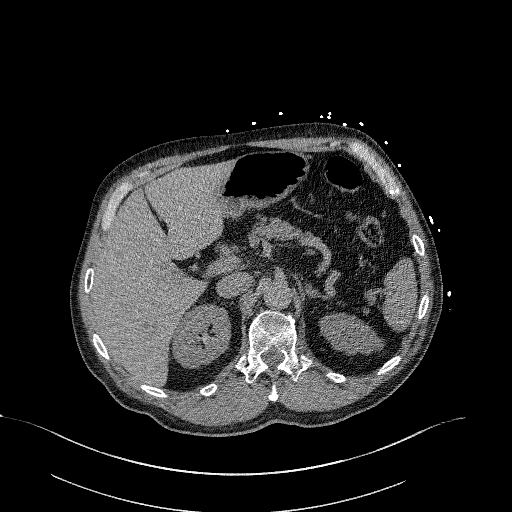
[im 84/363  lung]
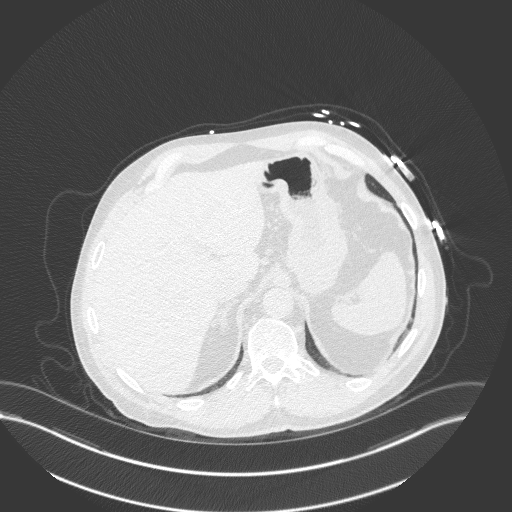
[im 112/363  mediastinal]
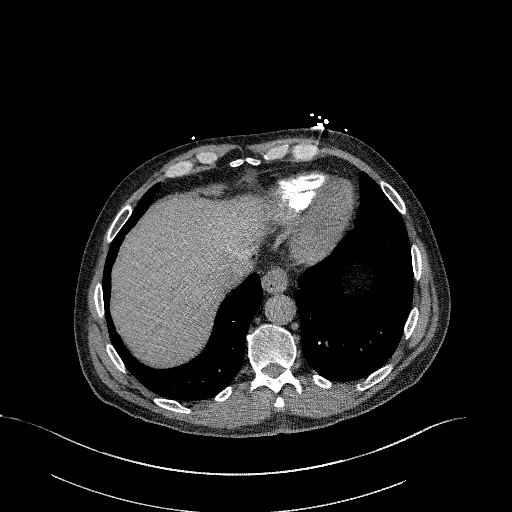
[im 140/363  lung]
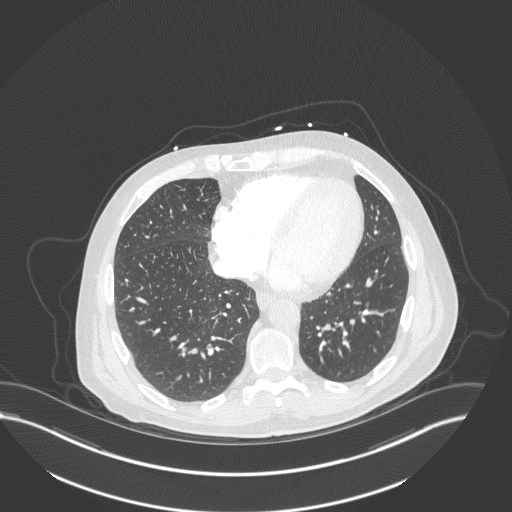
[im 168/363  mediastinal]
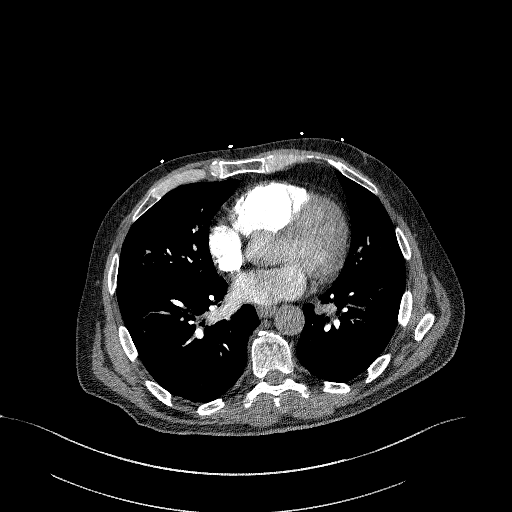
[im 195/363  lung]
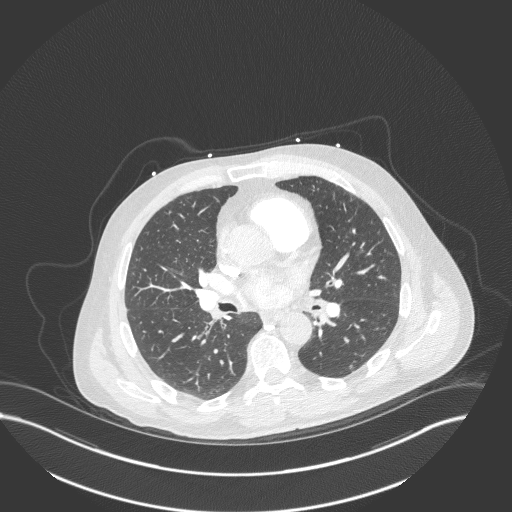
[im 223/363  mediastinal]
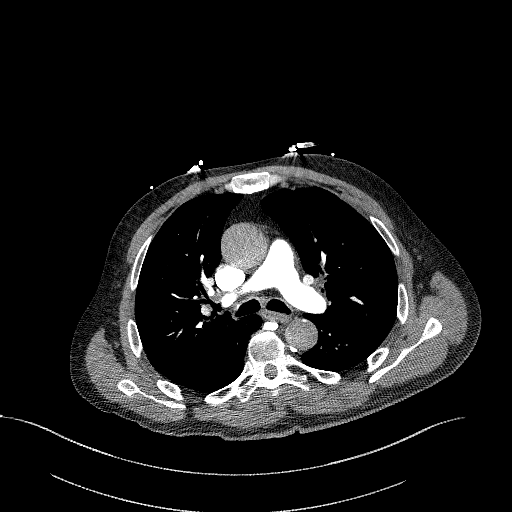
[im 251/363  lung]
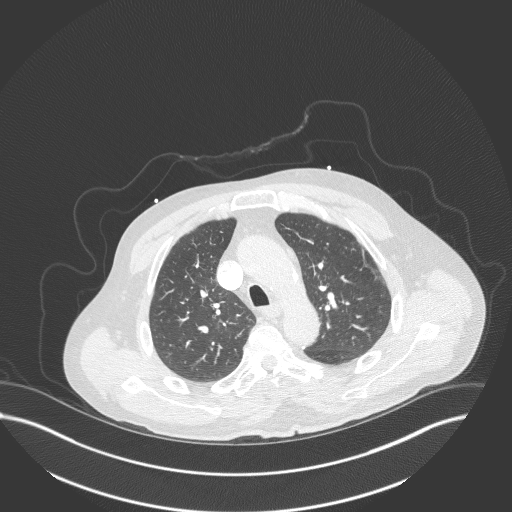
[im 279/363  mediastinal]
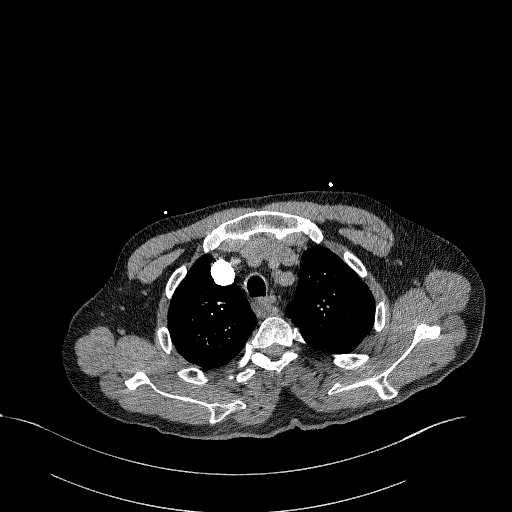
[im 307/363  lung]
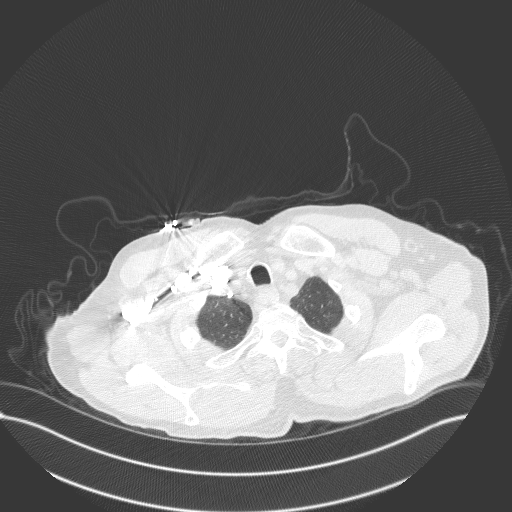
[im 335/363  mediastinal]
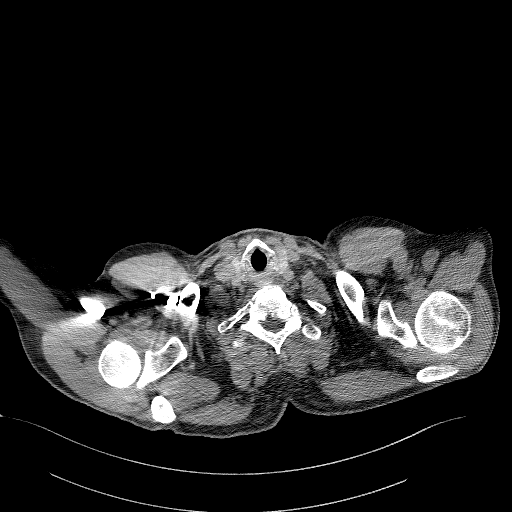

[Series 9: cor soft · coronal · 0.71mm/px · 1 of 140 slices shown]
[im 70/140  mediastinal]
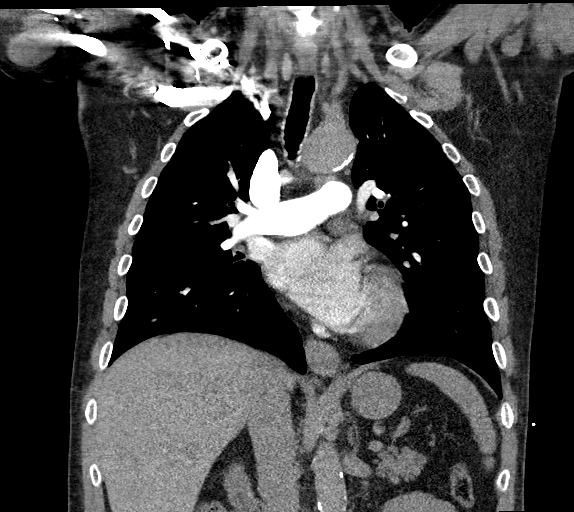

[13 of 36 positions shown; findings below may reference images not displayed]

FINDINGS: Cardiovascular: Adequate opacification of the pulmonary arterial
tree. No intraluminal filling defect identified to suggest acute
pulmonary embolism. Central pulmonary arteries are of normal
caliber. No significant coronary artery calcification. Cardiac size
within normal limits. No pericardial effusion. Mild atherosclerotic
calcification within the thoracic aorta. No aortic aneurysm. Note is
made of a aberrant right subclavian artery.

Mediastinum/Nodes: No enlarged mediastinal, hilar, or axillary lymph
nodes. Thyroid gland, trachea, and esophagus demonstrate no
significant findings.

Lungs/Pleura: Lungs are clear. No pleural effusion or pneumothorax.

Upper Abdomen: No acute abnormality.

Musculoskeletal: No chest wall abnormality. No acute or significant
osseous findings.

Review of the MIP images confirms the above findings.
IMPRESSION: No pulmonary embolism.  No acute intrathoracic pathology identified.

Aortic Atherosclerosis ([O4]-[O4]).

## 2021-01-21 MED ORDER — THIAMINE HCL 100 MG/ML IJ SOLN
Freq: Once | INTRAVENOUS | Status: AC
  Filled 2021-01-21: qty 1000

## 2021-01-21 MED ORDER — IOHEXOL 350 MG/ML SOLN
80.0000 mL | Freq: Once | INTRAVENOUS | Status: AC | PRN
  Administered 2021-01-21: 80 mL via INTRAVENOUS

## 2021-01-21 MED ORDER — ACETAMINOPHEN 325 MG PO TABS: 650.0000 mg | ORAL_TABLET | Freq: Four times a day (QID) | ORAL | Status: DC | PRN

## 2021-01-21 MED ORDER — ACETAMINOPHEN 650 MG RE SUPP: 650.0000 mg | Freq: Four times a day (QID) | RECTAL | Status: DC | PRN

## 2021-01-21 NOTE — Progress Notes (Addendum)
ANTICOAGULATION CONSULT NOTE - Follow Up Consult  Pharmacy Consult for Warfarin Indication: DVT  Allergies  Allergen Reactions   Codeine Other (See Comments)    +synthetic codeine---pancreatitis.    Penicillins Rash    Has patient had a PCN reaction causing immediate rash, facial/tongue/throat swelling, SOB or lightheadedness with hypotension: no Has patient had a PCN reaction causing severe rash involving mucus membranes or skin necrosis:yes rash  Has patient had a PCN reaction that required hospitalization no Has patient had a PCN reaction occurring within the last 10 years: yes If all of the above answers are "NO", then may proceed with Cephalosporin use.    Sulfa Antibiotics Rash    Patient Measurements: Height: 6\' 1"  (185.4 cm) Weight: 76.4 kg (168 lb 6.9 oz) IBW/kg (Calculated) : 79.9 Heparin Dosing Weight:   Vital Signs: Temp: 98 F (36.7 C) (12/20 0634) Temp Source: Oral (12/20 0634) BP: 124/71 (12/20 1043) Pulse Rate: 64 (12/20 1043)  Labs: Recent Labs    01/20/21 2157 01/20/21 2359 01/21/21 0443 01/21/21 0618  HGB 16.2  --  15.6  --   HCT 46.5  --  45.3  --   PLT 196  --  196  --   LABPROT 36.8*  --  37.0*  --   INR 3.7*  --  3.7*  --   CREATININE 0.77  --  0.80  --   TROPONINIHS 4 4  --  6    Estimated Creatinine Clearance: 104.8 mL/min (by C-G formula based on SCr of 0.8 mg/dL).   Medications:  Scheduled:  Infusions:   Assessment: 60 yo male admitted 12/19 with syncopal event.  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  Pharmacy consulted to dose warfarin. Home warfarin dose:  10 mg daily.  Last dose on 12/19.    INR 3.7, CBC WNL No major drug-drug interactions noted Diet:  regular Noted chronic EtOH use  Goal of Therapy:  INR 2-3 Monitor platelets by anticoagulation protocol: Yes   Plan:  Hold warfarin today.   Daily PT/INR. Monitor for signs and symptoms of bleeding.   1/20  PharmD, BCPS Clinical Pharmacist [facility] main pharmacy [phone] 01/21/2021 10:46 AM   Addendum:  If discharged, recommend to hold warfarin 12/20-12/21 then resume on 12/22 with reduced dose 7.5 mg daily (patient has 5mg  tablets at home).  1/23 PharmD, BCPS Clinical Pharmacist [facility] pharmacy [phone] 01/21/2021 12:29 PM

## 2021-01-21 NOTE — H&P (Signed)
History and Physical    PLEASE NOTE THAT DRAGON DICTATION SOFTWARE WAS USED IN THE CONSTRUCTION OF THIS NOTE.   Craig Hahn:[id number] DOB: 12-10-1960 DOA: 01/20/2021  PCP: Roderick Pee, PA  Patient coming from: home   I have personally briefly reviewed patient's old medical records in [facility]  Chief Complaint: Syncope  HPI: Craig Hahn is a 60 y.o. 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    The patient reports a single episode of syncope occurring around 9 AM Saturday, 01/18/2021.  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  He reports that he awoke on the floor, feet being spilled around him.  He reports that this was unwitnessed, therefore he is unsure how long he was unconscious, but does not believe that it was more than 1 minute.  He believes that he hit his head on the floor as will, but denies any significant residual headache, neck pain, or neck stiffness.  Denies any preceding dizziness, lightheadedness, or subjective sensation of impending loss of consciousness in the moments leading up to this syncopal episode.  Denies any associated loss of bowel or bladder function, nor any associated tongue biting.  Denies any history of seizures. enies any recent, immediately preceding, or ensuing chest pain, sob, palpitations, diaphoresis, nausea, vomiting dizziness.  Denies any prior history of syncopal event.    The patient denies any associated acute focal weakness, acute focal numbness, paresthesias, facial droop, slurred speech, expressive aphasia, acute change in vision,  dysphagia, vertigo.  Denies any recent subjective fever, chills, rigors, or generalized myalgias.  No recent rash, nausea, vomiting, diarrhea, or abdominal pain.  No melena or hematochezia.  He conveys a history of DVTs as well as prior history of pulmonary embolism in the setting of inheritable hypercoagulable state, which is also possessed by his 2 brothers.  He notes good compliance with chronic anticoagulation on warfarin.  Otherwise, not unable blood thinners as an outpatient.  Denies any known history of underlying hypertension, and reports that he is not on any antihypertensive medications at home, including a beta-blocker.     ED Course:  Vital signs in the ED were notable for the following: Tetramex 97.9; heart rate 53-56, blood pressure 128/93; respiratory rate 16-20, oxygen saturation 98 to 100% on room air.  Labs were notable for the following: CMP notable for the following: Sodium 137, potassium 3.7, bicarbonate 26, creatinine 0.77 glucose 111.  High-sensitivity troponin 9x2 noted to be 4 amplifications.  CBC notable for white blood cell count 7400, hemoglobin 11.2 [name] 3.7.  COVID-19/influenza PCR found to be negative.  Imaging and additional notable ED work-up: EKG demonstrates sinus bradycardia with borderline prolonged PR, heart rate 50, otherwise intervals noted to be normal, no evidence of T wave or ST changes, potential Q waves in V1/V2.  Chest x-ray showed no evidence of acute cardiopulmonary process, including no evidence of infiltrate, edema, effusion, or pneumothorax.  Noncontrast CT head showed no evidence of acute intracranial process complaint no evidence of intracranial hemorrhage or any evidence of acute ischemic infarct.  CT cervical spine showed no evidence of acute displaced fracture or traumatic listhesis of  the cervical spine.  CTA chest showed no evidence of acute pulmonary embolism, nor any evidence of acute cardiopulmonary process, including no evidence of  infiltrate, edema, effusion, or pneumothorax.  While in the ED, the following were administered: 500 cc normal saline bolus presenting, the patient was transferred to [facility] for observation to the med telemetry for further evaluation and management of presenting episode of syncope.     Review of Systems: As per HPI otherwise 10 point review of systems negative.   Past Medical History:  Diagnosis Date   Allergy    Anxiety    Clotting disorder (HCC)    Unsure if has ever been worked up, per pet has had 9 clots in the past, last was in 2006, both brothers also have clotting issues   Depression    DVT (deep venous thrombosis) (HCC)     Past Surgical History:  Procedure Laterality Date   dental implants     x8    Social History:  reports that he quit smoking about 4 years ago. His smoking use included e-cigarettes and cigarettes. He smoked an average of 1 pack per day. He has never used smokeless tobacco. He reports current alcohol use. He reports current drug use. Drug: Marijuana.   Allergies  Allergen Reactions   Codeine Other (See Comments)    +synthetic codeine---pancreatitis.    Penicillins Rash    Has patient had a PCN reaction causing immediate rash, facial/tongue/throat swelling, SOB or lightheadedness with hypotension: no Has patient had a PCN reaction causing severe rash involving mucus membranes or skin necrosis:yes rash  Has patient had a PCN reaction that required hospitalization no Has patient had a PCN reaction occurring within the last 10 years: yes If all of the above answers are "NO", then may proceed with Cephalosporin use.    Sulfa Antibiotics Rash    Family History  Problem Relation Age of Onset   Heart attack Father    Clotting disorder Brother    Clotting disorder Brother     Family history reviewed and not pertinent    Prior to Admission medications   Medication Sig Start Date End Date Taking? Authorizing Provider  MISC NATURAL PRODUCTS  PO Take 3 capsules by mouth 2 (two) times daily. Serrapeptase & Nattokinase    [provider]  nitroGLYCERIN (NITROSTAT) 0.4 MG SL tablet Place 1 tablet (0.4 mg total) under the tongue every 5 (five) minutes as needed for chest pain. 08/26/20 11/24/20  Netta Neat., NP     Objective    Physical Exam: Vitals:   01/20/21 2150 01/20/21 2230 01/20/21 2300 01/21/21 0207  BP: 128/83 133/72 (!) 147/81 (!) 154/81  Pulse: (!) 50 (!) 54 (!) 51 (!) 59  Resp: 16 20 20 18   Temp:    97.9 F (36.6 C)  TempSrc:    Oral  SpO2: 99% 99% 98% 96%  Weight:      Height:        General: appears to be stated age; alert, oriented Skin: warm, dry, no rash Head:  AT/[place] Mouth:  Oral mucosa membranes appear dry, normal dentition Neck: supple; trachea midline Heart:  RRR; did not appreciate any M/R/G Lungs: CTAB, did not appreciate any wheezes, rales, or rhonchi Abdomen: + BS; soft, ND, NT Vascular: 2+ pedal pulses b/l; 2+ radial pulses b/l Extremities: no peripheral edema, no muscle wasting Neuro: strength and sensation intact in upper and lower extremities b/l    Labs on Admission: I have personally  reviewed following labs and imaging studies  CBC: Recent Labs  Lab 01/17/21 1230 01/20/21 2157  WBC 11.3* 7.4  NEUTROABS 8.9* 4.5  HGB 16.6 16.2  HCT 48.0 46.5  MCV 95.4 94.5  PLT 222 196   Basic Metabolic Panel: Recent Labs  Lab 01/17/21 1300 01/20/21 2157  NA 140 137  K 4.8 3.7  CL 108 104  CO2 25 26  GLUCOSE 90 111*  BUN 13 10  CREATININE 0.90 0.77  CALCIUM 9.1 9.0   GFR: Estimated Creatinine Clearance: 109.6 mL/min (by C-G formula based on SCr of 0.77 mg/dL). Liver Function Tests: Recent Labs  Lab 01/17/21 1300 01/20/21 2157  AST 36 20  ALT 49* 27  ALKPHOS 55 43  BILITOT 0.3 0.4  PROT 7.4 6.8  ALBUMIN 3.9 4.1   No results for input(s): LIPASE, AMYLASE in the last 168 hours. No results for input(s): AMMONIA in the last 168 hours. Coagulation  Profile: Recent Labs  Lab 01/20/21 2157  INR 3.7*   Cardiac Enzymes: No results for input(s): CKTOTAL, CKMB, CKMBINDEX, TROPONINI in the last 168 hours. BNP (last 3 results) No results for input(s): PROBNP in the last 8760 hours. HbA1C: No results for input(s): HGBA1C in the last 72 hours. CBG: Recent Labs  Lab 01/20/21 1948  GLUCAP 84   Lipid Profile: No results for input(s): CHOL, HDL, LDLCALC, TRIG, CHOLHDL, LDLDIRECT in the last 72 hours. Thyroid Function Tests: No results for input(s): TSH, T4TOTAL, FREET4, T3FREE, THYROIDAB in the last 72 hours. Anemia Panel: No results for input(s): VITAMINB12, FOLATE, FERRITIN, TIBC, IRON, RETICCTPCT in the last 72 hours. Urine analysis:    Component Value Date/Time   COLORURINE YELLOW 10/04/2015 1930   APPEARANCEUR CLOUDY (A) 10/04/2015 1930   LABSPEC 1.018 10/04/2015 1930   PHURINE 6.0 10/04/2015 1930   GLUCOSEU NEGATIVE 10/04/2015 1930   HGBUR NEGATIVE 10/04/2015 1930   BILIRUBINUR negative 06/22/2018 1137   KETONESUR NEGATIVE 10/04/2015 1930   PROTEINUR Negative 06/22/2018 1137   PROTEINUR NEGATIVE 10/04/2015 1930   UROBILINOGEN 0.2 06/22/2018 1137   UROBILINOGEN 0.2 04/07/2013 1852   NITRITE negative 06/22/2018 1137   NITRITE NEGATIVE 10/04/2015 1930   LEUKOCYTESUR Negative 06/22/2018 1137    Radiological Exams on Admission: CT Head Wo Contrast  Result Date: 01/20/2021 CLINICAL DATA:  Head trauma, coagulopathy (Age 53-64y) warfarin, loc; fall, coagulopathic, loc EXAM: CT HEAD WITHOUT CONTRAST CT CERVICAL SPINE WITHOUT CONTRAST TECHNIQUE: Multidetector CT imaging of the head and cervical spine was performed following the standard protocol without intravenous contrast. Multiplanar CT image reconstructions of the cervical spine were also generated. COMPARISON:  None. FINDINGS: CT HEAD FINDINGS BRAIN: BRAIN Pprominence of the lateral ventricles may be related to central predominant atrophy, although a component of normal  pressure/communicating hydrocephalus cannot be excluded. Patchy and confluent areas of decreased attenuation are noted throughout the deep and periventricular white matter of the cerebral hemispheres bilaterally, compatible with chronic microvascular ischemic disease. No evidence of large-territorial acute infarction. No parenchymal hemorrhage. Enlarged pituitary gland measuring up to 1 cm with hyperdensity associated posteriorly. No extra-axial collection. No mass effect or midline shift. No hydrocephalus. Basilar cisterns are patent. Vascular: No hyperdense vessel. Atherosclerotic calcifications are present within the cavernous internal carotid arteries. Skull: No acute fracture or focal lesion. Sinuses/Orbits: Paranasal sinuses and mastoid air cells are clear. The orbits are unremarkable. Other: None. CT CERVICAL SPINE FINDINGS Alignment: Normal. Skull base and vertebrae: Multilevel degenerative changes of the spine. No acute fracture. No aggressive appearing focal osseous lesion  or focal pathologic process. Soft tissues and spinal canal: No prevertebral fluid or swelling. No visible canal hematoma. Upper chest: Biapical pleural/pulmonary scarring. Possible mild emphysematous changes. Other: None. IMPRESSION: 1. Prominence of the lateral ventricles may be related to central predominant atrophy, although a component of normal pressure/communicating hydrocephalus cannot be excluded. 2. Enlarged pituitary gland measuring up to 1 cm. Consider nonemergent MRI sella protocol further evaluation if clinically indicated. 3. No acute intracranial abnormality. 4. No acute displaced fracture or traumatic listhesis of the cervical spine. Electronically Signed   By: Tish Frederickson M.D.   On: 01/20/2021 20:26   CT Angio Chest PE W and/or Wo Contrast  Result Date: 01/21/2021 CLINICAL DATA:  Pulmonary embolism (PE) suspected, high prob. Left arm weakness. EXAM: CT ANGIOGRAPHY CHEST WITH CONTRAST TECHNIQUE: Multidetector CT  imaging of the chest was performed using the standard protocol during bolus administration of intravenous contrast. Multiplanar CT image reconstructions and MIPs were obtained to evaluate the vascular anatomy. CONTRAST:  80mL OMNIPAQUE IOHEXOL 350 MG/ML SOLN COMPARISON:  04/12/2020 FINDINGS: Cardiovascular: Adequate opacification of the pulmonary arterial tree. No intraluminal filling defect identified to suggest acute pulmonary embolism. Central pulmonary arteries are of normal caliber. No significant coronary artery calcification. Cardiac size within normal limits. No pericardial effusion. Mild atherosclerotic calcification within the thoracic aorta. No aortic aneurysm. 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 Mediastinum/Nodes: No enlarged mediastinal, hilar, or axillary lymph nodes. Thyroid gland, trachea, and esophagus demonstrate no significant findings. Lungs/Pleura: Lungs are clear. No pleural effusion or pneumothorax. Upper Abdomen: No acute abnormality. Musculoskeletal: No chest wall abnormality. No acute or significant osseous findings. Review of the MIP images confirms the above findings. IMPRESSION: No pulmonary embolism.  No acute intrathoracic pathology identified. Aortic Atherosclerosis (ICD10-I70.0). Electronically Signed   By: Helyn Numbers M.D.   On: 01/21/2021 00:45   CT Cervical Spine Wo Contrast  Result Date: 01/20/2021 CLINICAL DATA:  Head trauma, coagulopathy (Age 60-64y) warfarin, loc; fall, coagulopathic, loc EXAM: CT HEAD WITHOUT CONTRAST CT CERVICAL SPINE WITHOUT CONTRAST TECHNIQUE: Multidetector CT imaging of the head and cervical spine was performed following the standard protocol without intravenous contrast. Multiplanar CT image reconstructions of the cervical spine were also generated. COMPARISON:  None. FINDINGS: CT HEAD FINDINGS BRAIN: BRAIN Pprominence of the lateral ventricles may be related to central predominant atrophy, although a component of normal  pressure/communicating hydrocephalus cannot be excluded. Patchy and confluent areas of decreased attenuation are noted throughout the deep and periventricular white matter of the cerebral hemispheres bilaterally, compatible with chronic microvascular ischemic disease. No evidence of large-territorial acute infarction. No parenchymal hemorrhage. Enlarged pituitary gland measuring up to 1 cm with hyperdensity associated posteriorly. No extra-axial collection. No mass effect or midline shift. No hydrocephalus. Basilar cisterns are patent. Vascular: No hyperdense vessel. Atherosclerotic calcifications are present within the cavernous internal carotid arteries. Skull: No acute fracture or focal lesion. Sinuses/Orbits: Paranasal sinuses and mastoid air cells are clear. The orbits are unremarkable. Other: None. CT CERVICAL SPINE FINDINGS Alignment: Normal. Skull base and vertebrae: Multilevel degenerative changes of the spine. No acute fracture. No aggressive appearing focal osseous lesion or focal pathologic process. Soft tissues and spinal canal: No prevertebral fluid or swelling. No visible canal hematoma. Upper chest: Biapical pleural/pulmonary scarring. Possible mild emphysematous changes. Other: None. IMPRESSION: 1. Prominence of the lateral ventricles may be related to central predominant atrophy, although a component of normal pressure/communicating hydrocephalus cannot be excluded. 2. Enlarged pituitary gland measuring up to 1 cm. Consider nonemergent  MRI sella protocol further evaluation if clinically indicated. 3. No acute intracranial abnormality. 4. No acute displaced fracture or traumatic listhesis of the cervical spine. Electronically Signed   By: Tish Frederickson M.D.   On: 01/20/2021 20:26   DG Chest Portable 1 View  Result Date: 01/20/2021 CLINICAL DATA:  Loss of consciousness. EXAM: PORTABLE CHEST 1 VIEW COMPARISON:  Chest x-ray 04/12/2020, CT chest 04/12/2020 FINDINGS: The heart and mediastinal  contours are unchanged. Aortic calcification. No focal consolidation. No pulmonary edema. No pleural effusion. No pneumothorax. No acute osseous abnormality. IMPRESSION: No active disease. Electronically Signed   By: Tish Frederickson M.D.   On: 01/20/2021 20:13     EKG: Independently reviewed, with result as described above.    Assessment/Plan    Principal Problem:   Syncope Active Problems:   DVT (deep venous thrombosis) (HCC)   Alcohol abuse, daily use     #) Syncope: 1 episode of syncope that occurred without prodrome the patient was standing making coffee after recently having been from a seated to a standing position.  Given recent position change, differential includes possibility of orthostatic hypotension, although the absence of reported prodrome (possibility slightly less likely.  Given olfactory influences from actively making coffee at the time, differential also includes vasovagal syncope.  Patient denies use of any antihypertensive medications starting AV nodal blocking agents.  he is noted to be mildly bradycardic blood pressures, is in the possibility of bradycardia arrhythmia within the differential for his syncopal episode.  No evidence of acute ischemic changes to suggest stroke, while CT head showed no evidence of acute intracranial process, including no evidence of intracranial hemorrhage or acute ischemic infarct.  Troponin x2 have been negative, but will continue to trend to further rule out ACS is contributing factor, will closely monitor on telemetry for any contributory arrhythmia.  Not associate with any recent chest pain.  Patient's chronic alcohol abuse is noted, increasing risk for seizures as potential treating factor leading to presenting syncope, although the current differential does not appear to favor this possibility in terms of relative likelihood..  Of note, CTA chest showed no evidence of acute PE.    Plan: I have placed a nursing communication order  requesting that orthostatic vital signs x 1 set be checked and documented. Monitor strict I's and O's.  Add-on serum Mg level. Check CMP, CBC, serum Mg level in the AM. Fall precautions ordered. Trend trop.  Echocardiogram ordered for the morning.  Urinary drug screen.  Check prolactin level.     #)  history of recurrent DVTs as well as prior pulmonary embolism: patient conveys this history in the setting of a confirmed history of inheritable hypercoagulable state, with asymmetrical nature of his inheritable hypercoagulable state.  Conveys good compliance with home warfarin as a means of chronic anticoagulation given his history, with presenting INR noted to be slightly supratherapeutic at 3.7.  In the context of presenting syncope, CTA chest was pursued, and demonstrated no evidence of acute PE.  Plan: Inpatient pharmacy consulted for assistance with dosing of warfarin during this hospitalization.  Repeat INR in the morning.  CBC in the morning.       #) Chronic Alcohol Abuse: the patient reports typical daily alcohol consumption of 4 twelve oz beers. Most recent alcohol consumption occurred on Saturday, 01/18/2021. Close monitoring for development of evidence of alcohol withdrawal, including close attention to trend in vital signs, as well as close monitoring of electrolytes, as described below.  Denies any history  of denies any known history of seizures.  No evidence of withdrawals at this time.    Plan: counseled the patient on the importance of reduction in alcohol consumption.  Consider consult to transition of care team. Close monitoring of ensuing BP and HR via routine VS. refrain from initiation of CIWA for now.  Telemetry.  Add on serum magnesium level.Check serum phosphorus level. Repeat CMP in the morning. Check INR. Add-on serum ethanol level. UDS. Banana bag x 1, followed by initiation of oral thiamine, folic acid and multivitamin supplementation.       DVT prophylaxis:  Warfarin, presenting INR supratherapeutic Code Status: Full code Family Communication: none Disposition Plan: Per Rounding Team Consults called: none;  Admission status: Observation; med telemetry   PLEASE NOTE THAT DRAGON DICTATION SOFTWARE WAS USED IN THE CONSTRUCTION OF THIS NOTE.   Chaney Born Karuna Balducci DO [facility] From 7PM - 7AM   01/21/2021, 2:45 AM

## 2021-01-21 NOTE — Plan of Care (Signed)

## 2021-01-21 NOTE — Progress Notes (Signed)
ANTICOAGULATION CONSULT NOTE - Initial Consult  Pharmacy Consult for warfarin Indication: DVT  Allergies  Allergen Reactions   Codeine Other (See Comments)    +synthetic codeine---pancreatitis.    Penicillins Rash    Has patient had a PCN reaction causing immediate rash, facial/tongue/throat swelling, SOB or lightheadedness with hypotension: no Has patient had a PCN reaction causing severe rash involving mucus membranes or skin necrosis:yes rash  Has patient had a PCN reaction that required hospitalization no Has patient had a PCN reaction occurring within the last 10 years: yes If all of the above answers are "NO", then may proceed with Cephalosporin use.    Sulfa Antibiotics Rash    Patient Measurements: Height: 6\' 1"  (185.4 cm) Weight: 80.7 kg (178 lb) IBW/kg (Calculated) : 79.9 Heparin Dosing Weight:   Vital Signs: Temp: 97.9 F (36.6 C) (12/20 0207) Temp Source: Oral (12/20 0207) BP: 154/81 (12/20 0207) Pulse Rate: 59 (12/20 0207)  Labs: Recent Labs    01/20/21 2157 01/20/21 2359  HGB 16.2  --   HCT 46.5  --   PLT 196  --   LABPROT 36.8*  --   INR 3.7*  --   CREATININE 0.77  --   TROPONINIHS 4 4    Estimated Creatinine Clearance: 109.6 mL/min (by C-G formula based on SCr of 0.77 mg/dL).   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  Pharmacy consulted to dose warfarin  INR 3.7, CBC WNL  Goal of Therapy:  INR 2-3    Plan:  No warfarin tonight Daily INR  RPh 01/21/2021, 3:01 AM

## 2021-01-21 NOTE — Plan of Care (Signed)
  Problem: Education: Goal: Knowledge of General Education information will improve Description Including pain rating scale, medication(s)/side effects and non-pharmacologic comfort measures Outcome: Progressing   

## 2021-01-21 NOTE — Care Plan (Addendum)
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  Patient reported he was making coffee in the morning and the next thing he remember that he awoke on the floor, patient denies any preceding dizziness lightheadedness or subjective sense of impending loss of consciousness.  CT head no evidence of any acute intracranial process.  CT C-spine no evidence of acute displaced fracture or traumatic listhesis of the cervical spine.  CTA chest ruled out acute P,  No evidence of acute cardiopulmonary process.  Patient is admitted for unwitnessed syncope episode,  echocardiogram is ordered.  Patient was seen and examined.  He reports feeling much better and wants to be discharged.

## 2021-01-21 NOTE — TOC Initial Note (Signed)
Transition of Care [facility]) - Initial/Assessment Note    Patient Details  Name: Craig Hahn MRN: [id number] Date of Birth: 08/28/1960  [facility] of [facility]) CM/SW Contact:    Golda Acre, RN Phone Number: 01/21/2021, 8:12 AM  Clinical Narrative:                  Transition of Care [facility]) Screening Note   Patient Details  Name: Craig Hahn Date of Birth: [birth year]   Transition of Care [facility]) CM/SW Contact:    Golda Acre, RN Phone Number: 01/21/2021, 8:12 AM    Transition of Care Department [facility]) has reviewed patient and no TOC needs have been identified at this time. We will continue to monitor patient advancement through interdisciplinary progression rounds. If new patient transition needs arise, please place a TOC consult.  Plan is to return to home with self care.  Expected Discharge Plan: Home/Self Care Barriers to Discharge: Continued Medical Work up   Patient Goals and CMS Choice Patient states their goals for this hospitalization and ongoing recovery are:: to go home CMS Medicare.gov Compare Post Acute Care list provided to:: Patient Choice offered to / list presented to : Patient  Expected Discharge Plan and Services Expected Discharge Plan: Home/Self Care   Discharge Planning Services: CM Consult   Living arrangements for the past 2 months: Single Family Home                                      Prior Living Arrangements/Services Living arrangements for the past 2 months: Single Family Home Lives with:: Self Patient language and need for interpreter reviewed:: Yes Do you feel safe going back to the place where you live?: Yes            Criminal Activity/Legal Involvement Pertinent to Current Situation/Hospitalization: No - Comment as needed  Activities of Daily Living Home Assistive Devices/Equipment: None ADL Screening (condition at time of admission) Patient's cognitive ability adequate to safely complete daily  activities?: Yes Is the patient deaf or have difficulty hearing?: No Does the patient have difficulty seeing, even when wearing glasses/contacts?: No Does the patient have difficulty concentrating, remembering, or making decisions?: No Patient able to express need for assistance with ADLs?: Yes Does the patient have difficulty dressing or bathing?: No Independently performs ADLs?: Yes (appropriate for developmental age) Does the patient have difficulty walking or climbing stairs?: Yes Weakness of Legs: Both Weakness of Arms/Hands: None  Permission Sought/Granted                  Emotional Assessment Appearance:: Appears stated age Attitude/Demeanor/Rapport: Engaged Affect (typically observed): Calm Orientation: : Oriented to Place, Oriented to Self, Oriented to  Time, Oriented to Situation Alcohol / Substance Use: Not Applicable Psych Involvement: No (comment)  Admission diagnosis:  Syncope [R55] Patient Active Problem List   Diagnosis Date Noted   Syncope 01/20/2021   Clotting disorder (HCC) 01/21/2017   Encounter for therapeutic drug monitoring 04/07/2016   Alcohol abuse, daily use 12/20/2015   LPRD (laryngopharyngeal reflux disease) 12/20/2015   DVT (deep venous thrombosis) (HCC) 11/23/2013   PCP:  Roderick Pee, PA Pharmacy:   [facility] [address], [place] - [address] [place] [address] Phone: [phone] Fax: [phone]  [id number] - [place], [place] - [address] [place] [address] Phone: [phone] Fax: [phone]     Social Determinants of Health (SDOH) Interventions    Readmission Risk Interventions No flowsheet data found.

## 2021-01-22 LAB — PROTIME-INR
INR: 2.3 — ABNORMAL HIGH (ref 0.8–1.2)
Prothrombin Time: 25.2 seconds — ABNORMAL HIGH (ref 11.4–15.2)

## 2021-01-22 LAB — PROLACTIN: Prolactin: 14.4 ng/mL (ref 4.0–15.2)

## 2021-01-22 MED ORDER — WARFARIN SODIUM 5 MG PO TABS: 10.0000 mg | ORAL_TABLET | Freq: Every day | ORAL | Status: DC

## 2021-01-22 MED ORDER — WARFARIN - PHARMACIST DOSING INPATIENT: Freq: Every day | Status: DC

## 2021-01-22 NOTE — Progress Notes (Signed)
Pt given and explained discharge instructions. IV removed. Tele removed. Pt dressed in personal clothing and taken to the main entrance via wheelchair. All questions answered.

## 2021-01-22 NOTE — Discharge Instructions (Signed)
Advised to follow-up with primary care physician in 1 week. Will request primary care physician to obtain an MRI brain for enlarged pituitary gland.

## 2021-01-22 NOTE — TOC Transition Note (Signed)
Transition of Care [facility]) - CM/SW Discharge Note   Patient Details  Name: Craig Hahn MRN: [id number] Date of Birth: 1960-11-26  Transition [facility]) CM/SW Contact:  Golda Acre, RN Phone Number: 01/22/2021, 10:46 AM   Clinical Narrative:    Discharge to home no toc needs present.   Final next level of care: Home/Self Care Barriers to Discharge: Barriers Resolved   Patient Goals and CMS Choice Patient states their goals for this hospitalization and ongoing recovery are:: to go home CMS Medicare.gov Compare Post Acute Care list provided to:: Patient Choice offered to / list presented to : Patient  Discharge Placement                       Discharge Plan and Services   Discharge Planning Services: CM Consult                                 Social Determinants of Health (SDOH) Interventions     Readmission Risk Interventions No flowsheet data found.

## 2021-01-22 NOTE — Discharge Summary (Signed)
Physician Discharge Summary  COSTON MANDATO SPQ:[id number] DOB: November 18, 1960 DOA: 01/20/2021  PCP: Roderick Pee, PA  Admit date: 01/20/2021  Discharge date: 01/22/2021  Admitted From: Home.  Disposition: Home.  Recommendations for Outpatient Follow-up:  Follow up with PCP in 1-2 weeks. Please obtain BMP/CBC in one week. Will request primary care physician to obtain an MRI brain for enlarged pituitary gland.  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  Patient reports he was making coffee in the morning and the next thing he remember that he awoke on the floor, Patient denies any preceding dizziness, lightheadedness or subjective sense of impending loss of consciousness.  CT head:  No evidence of any acute intracranial process.  CT C-spine: No evidence of acute displaced fracture or traumatic listhesis of the cervical spine. CTA chest ruled out acute Pulmonary embolism ,  No evidence of acute cardiopulmonary process.  Patient was admitted for unwitnessed syncope episode, echocardiogram shows LVEF 60 to 65%, no regional wall motion abnormalities.  Patient blood pressure remained stable no orthostatic noted.  Patient feels better and want to be discharged.  Patient is being discharged home advised to follow-up with primary care physician.  Patient may need outpatient MRI for further evaluation of enlarged pituatary gland. We suspect syncopal episode could be  vasovagal.   Discharge Diagnoses:  Principal Problem:   Syncope Active Problems:   DVT (deep venous thrombosis) (HCC)   Alcohol abuse, daily use   Discharge  Instructions  Discharge Instructions     Call MD for:  difficulty breathing, headache or visual disturbances   Complete by: As directed    Call MD for:  persistant dizziness or light-headedness   Complete by: As directed    Call MD for:  persistant nausea and vomiting   Complete by: As directed    Diet - low sodium heart healthy   Complete by: As directed    Diet Carb Modified   Complete by: As directed    Discharge instructions   Complete by: As directed    Advised to follow-up with primary care physician in 1 week. Will request primary care physician to obtain an MRI brain for enlarged pituitary gland.   Increase activity slowly   Complete by: As directed       Allergies as of 01/22/2021       Reactions   Codeine Other (See Comments)   +synthetic codeine---pancreatitis.    Penicillins Rash   Has patient had a PCN reaction causing immediate rash, facial/tongue/throat swelling, SOB or lightheadedness with hypotension: no Has patient had a PCN reaction causing severe rash involving mucus membranes or skin necrosis:yes rash  Has patient had a PCN reaction that required hospitalization no Has patient had a PCN reaction occurring within the last 10 years: yes If all of the above answers are "NO", then may proceed with Cephalosporin use.   Sulfa Antibiotics Rash        Medication List     TAKE these medications    B-REDI/RED HEARTS/RED ROOSTERS PO Take 1 tablet by mouth daily.   ferrous sulfate 324 MG Tbec Take 324 mg by mouth daily.   FISH OIL PO Take 1 capsule by mouth daily.  MAGNESIUM PO Take 1 tablet by mouth daily.   nitroGLYCERIN 0.4 MG SL tablet Commonly known as: NITROSTAT Place 1 tablet (0.4 mg total) under the tongue every 5 (five) minutes as needed for chest pain.   VITAMIN B-12 PO Take 1 tablet by mouth daily.   VITAMIN C PO Take 1 tablet by mouth daily.   warfarin 5 MG tablet Commonly known as: COUMADIN Take 10 mg by mouth daily.   ZINC  PO Take 1 tablet by mouth daily.        Follow-up Information     Darel Hong A, PA Follow up in 1 week(s).   Specialty: Family Medicine Contact information: [address] [place] [address] [phone]                Allergies  Allergen Reactions   Codeine Other (See Comments)    +synthetic codeine---pancreatitis.    Penicillins Rash    Has patient had a PCN reaction causing immediate rash, facial/tongue/throat swelling, SOB or lightheadedness with hypotension: no Has patient had a PCN reaction causing severe rash involving mucus membranes or skin necrosis:yes rash  Has patient had a PCN reaction that required hospitalization no Has patient had a PCN reaction occurring within the last 10 years: yes If all of the above answers are "NO", then may proceed with Cephalosporin use.    Sulfa Antibiotics Rash    Consultations: None   Procedures/Studies: DG Ankle 2 Views Left  Result Date: 12/24/2020 CLINICAL DATA:  Redness and swelling to left internal ankle. No known injury. EXAM: LEFT ANKLE - 2 VIEW COMPARISON:  None. FINDINGS: There is no evidence of fracture, dislocation, or joint effusion. No erosion or periosteal reaction. Ankle mortise is preserved. There is no evidence of arthropathy or other focal bone abnormality. Mild medial soft tissue edema. No soft tissue calcifications, air, or radiopaque foreign body. IMPRESSION: Mild medial soft tissue edema. No osseous abnormality. Electronically Signed   By: Narda Rutherford M.D.   On: 12/24/2020 22:10   CT Head Wo Contrast  Result Date: 01/20/2021 CLINICAL DATA:  Head trauma, coagulopathy (Age 73-64y) warfarin, loc; fall, coagulopathic, loc EXAM: CT HEAD WITHOUT CONTRAST CT CERVICAL SPINE WITHOUT CONTRAST TECHNIQUE: Multidetector CT imaging of the head and cervical spine was performed following the standard protocol without intravenous contrast. Multiplanar CT image reconstructions of the cervical spine  were also generated. COMPARISON:  None. FINDINGS: CT HEAD FINDINGS BRAIN: BRAIN Pprominence of the lateral ventricles may be related to central predominant atrophy, although a component of normal pressure/communicating hydrocephalus cannot be excluded. Patchy and confluent areas of decreased attenuation are noted throughout the deep and periventricular white matter of the cerebral hemispheres bilaterally, compatible with chronic microvascular ischemic disease. No evidence of large-territorial acute infarction. No parenchymal hemorrhage. Enlarged pituitary gland measuring up to 1 cm with hyperdensity associated posteriorly. No extra-axial collection. No mass effect or midline shift. No hydrocephalus. Basilar cisterns are patent. Vascular: No hyperdense vessel. Atherosclerotic calcifications are present within the cavernous internal carotid arteries. Skull: No acute fracture or focal lesion. Sinuses/Orbits: Paranasal sinuses and mastoid air cells are clear. The orbits are unremarkable. Other: None. CT CERVICAL SPINE FINDINGS Alignment: Normal. Skull base and vertebrae: Multilevel degenerative changes of the spine. No acute fracture. No aggressive appearing focal osseous lesion or focal pathologic process. Soft tissues and spinal canal: No prevertebral fluid or swelling. No visible canal hematoma. Upper chest: Biapical pleural/pulmonary scarring. Possible mild emphysematous changes. Other: None. IMPRESSION: 1. Prominence of the lateral  ventricles may be related to central predominant atrophy, although a component of normal pressure/communicating hydrocephalus cannot be excluded. 2. Enlarged pituitary gland measuring up to 1 cm. Consider nonemergent MRI sella protocol further evaluation if clinically indicated. 3. No acute intracranial abnormality. 4. No acute displaced fracture or traumatic listhesis of the cervical spine. Electronically Signed   By: Tish Frederickson M.D.   On: 01/20/2021 20:26   CT Angio Chest PE W  and/or Wo Contrast  Result Date: 01/21/2021 CLINICAL DATA:  Pulmonary embolism (PE) suspected, high prob. Left arm weakness. EXAM: CT ANGIOGRAPHY CHEST WITH CONTRAST TECHNIQUE: Multidetector CT imaging of the chest was performed using the standard protocol during bolus administration of intravenous contrast. Multiplanar CT image reconstructions and MIPs were obtained to evaluate the vascular anatomy. CONTRAST:  80mL OMNIPAQUE IOHEXOL 350 MG/ML SOLN COMPARISON:  04/12/2020 FINDINGS: Cardiovascular: Adequate opacification of the pulmonary arterial tree. No intraluminal filling defect identified to suggest acute pulmonary embolism. Central pulmonary arteries are of normal caliber. No significant coronary artery calcification. Cardiac size within normal limits. No pericardial effusion. Mild atherosclerotic calcification within the thoracic aorta. No aortic aneurysm. 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 Mediastinum/Nodes: No enlarged mediastinal, hilar, or axillary lymph nodes. Thyroid gland, trachea, and esophagus demonstrate no significant findings. Lungs/Pleura: Lungs are clear. No pleural effusion or pneumothorax. Upper Abdomen: No acute abnormality. Musculoskeletal: No chest wall abnormality. No acute or significant osseous findings. Review of the MIP images confirms the above findings. IMPRESSION: No pulmonary embolism.  No acute intrathoracic pathology identified. Aortic Atherosclerosis (ICD10-I70.0). Electronically Signed   By: Helyn Numbers M.D.   On: 01/21/2021 00:45   CT Cervical Spine Wo Contrast  Result Date: 01/20/2021 CLINICAL DATA:  Head trauma, coagulopathy (Age 32-64y) warfarin, loc; fall, coagulopathic, loc EXAM: CT HEAD WITHOUT CONTRAST CT CERVICAL SPINE WITHOUT CONTRAST TECHNIQUE: Multidetector CT imaging of the head and cervical spine was performed following the standard protocol without intravenous contrast. Multiplanar CT image reconstructions of the cervical spine were  also generated. COMPARISON:  None. FINDINGS: CT HEAD FINDINGS BRAIN: BRAIN Pprominence of the lateral ventricles may be related to central predominant atrophy, although a component of normal pressure/communicating hydrocephalus cannot be excluded. Patchy and confluent areas of decreased attenuation are noted throughout the deep and periventricular white matter of the cerebral hemispheres bilaterally, compatible with chronic microvascular ischemic disease. No evidence of large-territorial acute infarction. No parenchymal hemorrhage. Enlarged pituitary gland measuring up to 1 cm with hyperdensity associated posteriorly. No extra-axial collection. No mass effect or midline shift. No hydrocephalus. Basilar cisterns are patent. Vascular: No hyperdense vessel. Atherosclerotic calcifications are present within the cavernous internal carotid arteries. Skull: No acute fracture or focal lesion. Sinuses/Orbits: Paranasal sinuses and mastoid air cells are clear. The orbits are unremarkable. Other: None. CT CERVICAL SPINE FINDINGS Alignment: Normal. Skull base and vertebrae: Multilevel degenerative changes of the spine. No acute fracture. No aggressive appearing focal osseous lesion or focal pathologic process. Soft tissues and spinal canal: No prevertebral fluid or swelling. No visible canal hematoma. Upper chest: Biapical pleural/pulmonary scarring. Possible mild emphysematous changes. Other: None. IMPRESSION: 1. Prominence of the lateral ventricles may be related to central predominant atrophy, although a component of normal pressure/communicating hydrocephalus cannot be excluded. 2. Enlarged pituitary gland measuring up to 1 cm. Consider nonemergent MRI sella protocol further evaluation if clinically indicated. 3. No acute intracranial abnormality. 4. No acute displaced fracture or traumatic listhesis of the cervical spine. Electronically Signed   By: Normajean Glasgow.D.  On: 01/20/2021 20:26   US Venous Img Lower  Bilateral  Result Date: 12/25/2020 CLINICAL DATA:  Lower extremity pain and edema. History of prior left lower extremity DVT in 2017 with associated pulmonary embolism. EXAM: BILATERAL LOWER EXTREMITY VENOUS DOPPLER ULTRASOUND TECHNIQUE: Gray-scale sonography with graded compression, as well as color Doppler and duplex ultrasound were performed to evaluate the lower extremity deep venous systems from the level of the common femoral vein and including the common femoral, femoral, profunda femoral, popliteal and calf veins including the posterior tibial, peroneal and gastrocnemius veins when visible. The superficial great saphenous vein was also interrogated. Spectral Doppler was utilized to evaluate flow at rest and with distal augmentation maneuvers in the common femoral, femoral and popliteal veins. COMPARISON:  Prior left lower extremity venous duplex ultrasound on 09/26/2015 FINDINGS: RIGHT LOWER EXTREMITY Common Femoral Vein: No evidence of thrombus. Normal compressibility, respiratory phasicity and response to augmentation. Saphenofemoral Junction: No evidence of thrombus. Normal compressibility and flow on color Doppler imaging. Profunda Femoral Vein: No evidence of thrombus. Normal compressibility and flow on color Doppler imaging. Femoral Vein: No evidence of thrombus. Normal compressibility, respiratory phasicity and response to augmentation. Popliteal Vein: Nonocclusive mural thrombus present with appearance consistent with chronic thrombus. Calf Veins: No evidence of thrombus. Normal compressibility and flow on color Doppler imaging. Superficial Great Saphenous Vein: No evidence of thrombus. Normal compressibility. Venous Reflux:  None. Other Findings: No evidence of superficial thrombophlebitis or abnormal fluid collection. LEFT LOWER EXTREMITY Common Femoral Vein: No evidence of thrombus. Normal compressibility, respiratory phasicity and response to augmentation. Saphenofemoral Junction: No evidence  of thrombus. Normal compressibility and flow on color Doppler imaging. Profunda Femoral Vein: No evidence of thrombus. Normal compressibility and flow on color Doppler imaging. Femoral Vein: Proximal femoral vein demonstrates normal patency. There is thrombus in the mid to distal left femoral vein which is partially echogenic and most likely predominantly chronic. Popliteal Vein: Nearly occlusive thrombus in the left popliteal vein with some flow present. This thrombus is not quite is echogenic and a component of acute thrombus cannot be excluded. Calf Veins: No evidence of thrombus. Normal compressibility and flow on color Doppler imaging. Superficial Great Saphenous Vein: Thrombophlebitis of the great saphenous vein in the left calf up to the level of the knee. The vein is thickened and echogenic in appearance and some of this thrombophlebitis may be chronic. Venous Reflux:  None. Other Findings: No evidence of superficial thrombophlebitis or abnormal fluid collection. IMPRESSION: 1. 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 2. Echogenic thrombus in the mid to distal left femoral vein is likely predominantly chronic. 3. Nearly occlusive thrombus in the left popliteal vein. While potentially chronic, a component of acute thrombus in the left popliteal vein cannot be excluded based on appearance. 4. Thrombophlebitis of the great saphenous vein from the knee region into the calf. The vein is thickened and echogenic in appearance and this may be chronic. Electronically Signed   By: Irish Lack M.D.   On: 12/25/2020 14:37   DG Chest Portable 1 View  Result Date: 01/20/2021 CLINICAL DATA:  Loss of consciousness. EXAM: PORTABLE CHEST 1 VIEW COMPARISON:  Chest x-ray 04/12/2020, CT chest 04/12/2020 FINDINGS: The heart and mediastinal contours are unchanged. Aortic calcification. No focal consolidation. No pulmonary edema. No pleural effusion.  No pneumothorax. No acute osseous abnormality. IMPRESSION: No active disease. Electronically Signed   By: Tish Frederickson M.D.   On: 01/20/2021  20:13   ECHOCARDIOGRAM COMPLETE  Result Date: 01/21/2021    ECHOCARDIOGRAM REPORT   Patient Name:   STRATTON VILLWOCK Date of Exam: 01/21/2021 Medical Rec #:  [id number]    Height:       73.0 in Accession #:    [id number]   Weight:       168.4 lb Date of Birth:  [birth year]    BSA:          2.000 m Patient Age:    60 years     BP:           122/66 mmHg Patient Gender: M            HR:           47 bpm. Exam Location:  Inpatient Procedure: 2D Echo, Cardiac Doppler, Color Doppler and 3D Echo Indications:    R55 Syncope  History:        Patient has prior history of Echocardiogram examinations, most                 recent 08/20/2020. Arrythmias:Bradycardia;                 Signs/Symptoms:Syncope.  Sonographer:    Vanetta Shawl Referring Phys: [id number] JUSTIN B HOWERTER IMPRESSIONS  1. Left ventricular ejection fraction, by estimation, is 55 to 60%. The left ventricle has normal function. The left ventricle has no regional wall motion abnormalities. Left ventricular diastolic parameters were normal.  2. Right ventricular systolic function is normal. The right ventricular size is normal. There is normal pulmonary artery systolic pressure. The estimated right ventricular systolic pressure is 20.2 mmHg.  3. The mitral valve is normal in structure. Trivial mitral valve regurgitation. No evidence of mitral stenosis.  4. The aortic valve is tricuspid. Aortic valve regurgitation is not visualized. No aortic stenosis is present.  5. The inferior vena cava is dilated in size with >50% respiratory variability, suggesting right atrial pressure of 8 mmHg. Comparison(s): No significant change from prior study. Conclusion(s)/Recommendation(s): Otherwise normal echocardiogram, with minor abnormalities described in the report. FINDINGS  Left Ventricle: Left ventricular ejection fraction, by  estimation, is 55 to 60%. The left ventricle has normal function. The left ventricle has no regional wall motion abnormalities. The left ventricular internal cavity size was normal in size. There is  no left ventricular hypertrophy. Left ventricular diastolic parameters were normal. Right Ventricle: The right ventricular size is normal. No increase in right ventricular wall thickness. Right ventricular systolic function is normal. There is normal pulmonary artery systolic pressure. The tricuspid regurgitant velocity is 1.75 m/s, and  with an assumed right atrial pressure of 8 mmHg, the estimated right ventricular systolic pressure is 20.2 mmHg. Left Atrium: Left atrial size was normal in size. Right Atrium: Right atrial size was normal in size. Pericardium: There is no evidence of pericardial effusion. Mitral Valve: Borderline MV bowing, no prolapse. The mitral valve is normal in structure. Trivial mitral valve regurgitation. No evidence of mitral valve stenosis. Tricuspid Valve: The tricuspid valve is normal in structure. Tricuspid valve regurgitation is trivial. No evidence of tricuspid stenosis. Aortic Valve: The aortic valve is tricuspid. Aortic valve regurgitation is not visualized. No aortic stenosis is present. Aortic valve mean gradient measures 4.0 mmHg. Aortic valve peak gradient measures 7.1 mmHg. Aortic valve area, by VTI measures 2.72 cm. Pulmonic Valve: The pulmonic valve was not well visualized. Pulmonic valve regurgitation is not visualized. Aorta: The aortic root, ascending aorta, aortic arch and descending aorta  are all structurally normal, with no evidence of dilitation or obstruction. Venous: The inferior vena cava is dilated in size with greater than 50% respiratory variability, suggesting right atrial pressure of 8 mmHg. IAS/Shunts: The atrial septum is grossly normal.  LEFT VENTRICLE PLAX 2D LVIDd:         4.85 cm   Diastology LVIDs:         3.45 cm   LV e' medial:    6.31 cm/s LV PW:          1.05 cm   LV E/e' medial:  9.9 LV IVS:        1.05 cm   LV e' lateral:   10.90 cm/s LVOT diam:     2.20 cm   LV E/e' lateral: 5.7 LV SV:         81 LV SV Index:   40 LVOT Area:     3.80 cm  RIGHT VENTRICLE             IVC RV Basal diam:  4.00 cm     IVC diam: 2.30 cm RV S prime:     11.20 cm/s LEFT ATRIUM             Index        RIGHT ATRIUM           Index LA diam:        3.15 cm 1.57 cm/m   RA Area:     16.50 cm LA Vol (A2C):   57.6 ml 28.80 ml/m  RA Volume:   42.30 ml  21.15 ml/m LA Vol (A4C):   43.2 ml 21.60 ml/m LA Biplane Vol: 51.7 ml 25.85 ml/m  AORTIC VALVE                    PULMONIC VALVE AV Area (Vmax):    2.56 cm     PV Vmax:       0.81 m/s AV Area (Vmean):   2.41 cm     PV Peak grad:  2.6 mmHg AV Area (VTI):     2.72 cm AV Vmax:           133.00 cm/s AV Vmean:          91.000 cm/s AV VTI:            0.296 m AV Peak Grad:      7.1 mmHg AV Mean Grad:      4.0 mmHg LVOT Vmax:         89.40 cm/s LVOT Vmean:        57.700 cm/s LVOT VTI:          0.212 m LVOT/AV VTI ratio: 0.72  AORTA Ao Root diam: 3.40 cm Ao Asc diam:  3.40 cm MITRAL VALVE               TRICUSPID VALVE MV Area (PHT): 3.63 cm    TR Peak grad:   12.2 mmHg MV Decel Time: 209 msec    TR Vmax:        175.00 cm/s MV E velocity: 62.60 cm/s MV A velocity: 57.80 cm/s  SHUNTS MV E/A ratio:  1.08        Systemic VTI:  0.21 m                            Systemic Diam: 2.20 cm Jodelle Red MD Electronically signed by Jodelle Red MD Signature  Date/Time: 01/21/2021/4:12:19 PM    Final       Subjective: Patient was seen and examined at bedside.  Overnight events noted.   Patient reports feeling much better.  Patient wants to be discharged.  Discharge Exam: Vitals:   01/21/21 2016 01/22/21 0440  BP: 112/71 116/77  Pulse: (!) 55 (!) 54  Resp: 14 16  Temp: 98.6 F (37 C) 97.6 F (36.4 C)  SpO2: 93% 93%   Vitals:   01/21/21 1043 01/21/21 2016 01/22/21 0440 01/22/21 0500  BP: 124/71 112/71 116/77   Pulse: 64  (!) 55 (!) 54   Resp: Temp:  98.6 F (37 C) 97.6 F (36.4 C)   TempSrc:  Oral Oral   SpO2: 95% 93% 93%   Weight:    76.3 kg  Height:        General: Pt is alert, awake, not in acute distress Cardiovascular: RRR, S1/S2 +, no rubs, no gallops Respiratory: CTA bilaterally, no wheezing, no rhonchi Abdominal: Soft, NT, ND, bowel sounds + Extremities: no edema, no cyanosis    The results of significant diagnostics from this hospitalization (including imaging, microbiology, ancillary and laboratory) are listed below for reference.     Microbiology: Recent Results (from the past 240 hour(s))  Resp Panel by RT-PCR (Flu A&B, Covid) Nasopharyngeal Swab     Status: None   Collection Time: 01/20/21  9:57 PM   Specimen: Nasopharyngeal Swab; Nasopharyngeal(NP) swabs in vial transport medium  Result Value Ref Range Status   SARS Coronavirus 2 by RT PCR NEGATIVE NEGATIVE Final    Comment: (NOTE) SARS-CoV-2 target nucleic acids are NOT DETECTED.  The SARS-CoV-2 RNA is generally detectable in upper respiratory specimens during the acute phase of infection. The lowest concentration of SARS-CoV-2 viral copies this assay can detect is 138 copies/mL. A negative result does not preclude SARS-Cov-2 infection and should not be used as the sole basis for treatment or other patient management decisions. A negative result may occur with  improper specimen collection/handling, submission of specimen other than nasopharyngeal swab, presence of viral mutation(s) within the areas targeted by this assay, and inadequate number of viral copies(<138 copies/mL). A negative result must be combined with clinical observations, patient history, and epidemiological information. The expected result is Negative.  Fact Sheet for Patients:  BloggerCourse.com  Fact Sheet for Healthcare Providers:  SeriousBroker.it  This test is no t yet approved or  cleared by the [place] FDA and  has been authorized for detection and/or diagnosis of SARS-CoV-2 by FDA under an Emergency Use Authorization (EUA). This EUA will remain  in effect (meaning this test can be used) for the duration of the COVID-19 declaration under Section 564(b)(1) of the Act, 21 U.S.C.section 360bbb-3(b)(1), unless the authorization is terminated  or revoked sooner.       Influenza A by PCR NEGATIVE NEGATIVE Final   Influenza B by PCR NEGATIVE NEGATIVE Final    Comment: (NOTE) The Xpert Xpress SARS-CoV-2/FLU/RSV plus assay is intended as an aid in the diagnosis of influenza from Nasopharyngeal swab specimens and should not be used as a sole basis for treatment. Nasal washings and aspirates are unacceptable for Xpert Xpress SARS-CoV-2/FLU/RSV testing.  Fact Sheet for Patients: BloggerCourse.com  Fact Sheet for Healthcare Providers: SeriousBroker.it  This test is not yet approved or cleared by the [place] FDA and has been authorized for detection and/or diagnosis of SARS-CoV-2 by FDA under an Emergency Use Authorization (EUA). This EUA will remain in  effect (meaning this test can be used) for the duration of the COVID-19 declaration under Section 564(b)(1) of the Act, 21 U.S.C. section 360bbb-3(b)(1), unless the authorization is terminated or revoked.  Performed at [facility], [address] [place], [place] [address]      Labs: BNP (last 3 results) No results for input(s): BNP in the last 8760 hours. Basic Metabolic Panel: Recent Labs  Lab 01/17/21 1300 01/20/21 2157 01/21/21 0443  NA 140 137 138  K 4.8 3.7 4.0  CL 108 104 107  CO2 GLUCOSE 90 111* 87  BUN CREATININE 0.90 0.77 0.80  CALCIUM 9.1 9.0 8.5*  MG  --   --  2.3   Liver Function Tests: Recent Labs  Lab 01/17/21 1300 01/20/21 2157 01/21/21 0443  AST 36 20 25  ALT 49* 27 27   ALKPHOS 55 43 46  BILITOT 0.3 0.4 0.8  PROT 7.4 6.8 6.5  ALBUMIN 3.9 4.1 3.6   No results for input(s): LIPASE, AMYLASE in the last 168 hours. No results for input(s): AMMONIA in the last 168 hours. CBC: Recent Labs  Lab 01/17/21 1230 01/20/21 2157 01/21/21 0443  WBC 11.3* 7.4 5.4  NEUTROABS 8.9* 4.5 2.7  HGB 16.6 16.2 15.6  HCT 48.0 46.5 45.3  MCV 95.4 94.5 95.6  PLT 222 196 196   Cardiac Enzymes: No results for input(s): CKTOTAL, CKMB, CKMBINDEX, TROPONINI in the last 168 hours. BNP: Invalid input(s): POCBNP CBG: Recent Labs  Lab 01/20/21 1948  GLUCAP 84   D-Dimer No results for input(s): DDIMER in the last 72 hours. Hgb A1c No results for input(s): HGBA1C in the last 72 hours. Lipid Profile No results for input(s): CHOL, HDL, LDLCALC, TRIG, CHOLHDL, LDLDIRECT in the last 72 hours. Thyroid function studies No results for input(s): TSH, T4TOTAL, T3FREE, THYROIDAB in the last 72 hours.  Invalid input(s): FREET3 Anemia work up No results for input(s): VITAMINB12, FOLATE, FERRITIN, TIBC, IRON, RETICCTPCT in the last 72 hours. Urinalysis    Component Value Date/Time   COLORURINE YELLOW 01/21/2021 0640   APPEARANCEUR CLEAR 01/21/2021 0640   LABSPEC >1.046 (H) 01/21/2021 0640   PHURINE 5.0 01/21/2021 0640   GLUCOSEU NEGATIVE 01/21/2021 0640   HGBUR NEGATIVE 01/21/2021 0640   BILIRUBINUR NEGATIVE 01/21/2021 0640   BILIRUBINUR negative 06/22/2018 1137   KETONESUR 5 (A) 01/21/2021 0640   PROTEINUR NEGATIVE 01/21/2021 0640   UROBILINOGEN 0.2 06/22/2018 1137   UROBILINOGEN 0.2 04/07/2013 1852   NITRITE NEGATIVE 01/21/2021 0640   LEUKOCYTESUR NEGATIVE 01/21/2021 0640   Sepsis Labs Invalid input(s): PROCALCITONIN,  WBC,  LACTICIDVEN Microbiology Recent Results (from the past 240 hour(s))  Resp Panel by RT-PCR (Flu A&B, Covid) Nasopharyngeal Swab     Status: None   Collection Time: 01/20/21  9:57 PM   Specimen: Nasopharyngeal Swab; Nasopharyngeal(NP) swabs in  vial transport medium  Result Value Ref Range Status   SARS Coronavirus 2 by RT PCR NEGATIVE NEGATIVE Final    Comment: (NOTE) SARS-CoV-2 target nucleic acids are NOT DETECTED.  The SARS-CoV-2 RNA is generally detectable in upper respiratory specimens during the acute phase of infection. The lowest concentration of SARS-CoV-2 viral copies this assay can detect is 138 copies/mL. A negative result does not preclude SARS-Cov-2 infection and should not be used as the sole basis for treatment or other patient management decisions. A negative result may occur with  improper specimen collection/handling, submission of specimen other than nasopharyngeal swab, presence of viral mutation(s)  within the areas targeted by this assay, and inadequate number of viral copies(<138 copies/mL). A negative result must be combined with clinical observations, patient history, and epidemiological information. The expected result is Negative.  Fact Sheet for Patients:  BloggerCourse.com  Fact Sheet for Healthcare Providers:  SeriousBroker.it  This test is no t yet approved or cleared by the [place] FDA and  has been authorized for detection and/or diagnosis of SARS-CoV-2 by FDA under an Emergency Use Authorization (EUA). This EUA will remain  in effect (meaning this test can be used) for the duration of the COVID-19 declaration under Section 564(b)(1) of the Act, 21 U.S.C.section 360bbb-3(b)(1), unless the authorization is terminated  or revoked sooner.       Influenza A by PCR NEGATIVE NEGATIVE Final   Influenza B by PCR NEGATIVE NEGATIVE Final    Comment: (NOTE) The Xpert Xpress SARS-CoV-2/FLU/RSV plus assay is intended as an aid in the diagnosis of influenza from Nasopharyngeal swab specimens and should not be used as a sole basis for treatment. Nasal washings and aspirates are unacceptable for Xpert Xpress SARS-CoV-2/FLU/RSV testing.  Fact  Sheet for Patients: BloggerCourse.com  Fact Sheet for Healthcare Providers: SeriousBroker.it  This test is not yet approved or cleared by the [place] FDA and has been authorized for detection and/or diagnosis of SARS-CoV-2 by FDA under an Emergency Use Authorization (EUA). This EUA will remain in effect (meaning this test can be used) for the duration of the COVID-19 declaration under Section 564(b)(1) of the Act, 21 U.S.C. section 360bbb-3(b)(1), unless the authorization is terminated or revoked.  Performed at [facility], [address], [place], [place] [address]      Time coordinating discharge: Over 30 minutes  SIGNED:   Cipriano Bunker, MD  [facility] 01/22/2021, 3:39 PM Pager   If 7PM-7AM, please contact night-coverage

## 2021-01-22 NOTE — Progress Notes (Signed)
ANTICOAGULATION CONSULT NOTE - Follow Up Consult  Pharmacy Consult for Warfarin Indication: DVT  Allergies  Allergen Reactions   Codeine Other (See Comments)    +synthetic codeine---pancreatitis.    Penicillins Rash    Has patient had a PCN reaction causing immediate rash, facial/tongue/throat swelling, SOB or lightheadedness with hypotension: no Has patient had a PCN reaction causing severe rash involving mucus membranes or skin necrosis:yes rash  Has patient had a PCN reaction that required hospitalization no Has patient had a PCN reaction occurring within the last 10 years: yes If all of the above answers are "NO", then may proceed with Cephalosporin use.    Sulfa Antibiotics Rash    Patient Measurements: Height: 6\' 1"  (185.4 cm) Weight: 76.3 kg (168 lb 3.4 oz) IBW/kg (Calculated) : 79.9 Heparin Dosing Weight:   Vital Signs: Temp: 97.6 F (36.4 C) (12/21 0440) Temp Source: Oral (12/21 0440) BP: 116/77 (12/21 0440) Pulse Rate: 54 (12/21 0440)  Labs: Recent Labs    01/20/21 2157 01/20/21 2359 01/21/21 0443 01/21/21 0618 01/22/21 0457  HGB 16.2  --  15.6  --   --   HCT 46.5  --  45.3  --   --   PLT 196  --  196  --   --   LABPROT 36.8*  --  37.0*  --  25.2*  INR 3.7*  --  3.7*  --  2.3*  CREATININE 0.77  --  0.80  --   --   TROPONINIHS 4 4  --  6  --      Estimated Creatinine Clearance: 104.6 mL/min (by C-G formula based on SCr of 0.8 mg/dL).   Medications:  Scheduled:  Infusions:   Assessment: 60 yo male admitted 12/19 with syncopal event.  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  Pharmacy consulted to dose warfarin. Home warfarin dose:  10 mg daily.  Last dose on 12/19.    INR with large decreased to 2.3, therapeutic range. CBC WNL, last on 12/20 No major drug-drug interactions noted Diet:  regular Noted chronic EtOH use  Goal of Therapy:  INR 2-3 Monitor platelets by anticoagulation protocol: Yes    Plan:  Resume home dose warfarin 10 mg PO daily today.   Daily PT/INR. Monitor for signs and symptoms of bleeding. If discharged:  recommend to resume home warfarin dose 10mg  PO daily and recheck INR in 5 days on 12/26.     PharmD, BCPS Clinical Pharmacist [facility] main pharmacy [phone] 01/22/2021 10:24 AM

## 2021-01-23 LAB — FACTOR 5 LEIDEN

## 2021-01-23 LAB — PROTHROMBIN GENE MUTATION

## 2021-01-31 ENCOUNTER — Ambulatory Visit: Payer: Self-pay | Admitting: Hematology and Oncology

## 2021-02-07 ENCOUNTER — Encounter: Payer: Self-pay | Admitting: Hematology and Oncology

## 2021-02-07 ENCOUNTER — Telehealth: Payer: Self-pay | Admitting: Hematology and Oncology

## 2021-02-07 ENCOUNTER — Inpatient Hospital Stay: Payer: Self-pay | Attending: Hematology and Oncology | Admitting: Hematology and Oncology

## 2021-02-07 ENCOUNTER — Other Ambulatory Visit: Payer: Self-pay

## 2021-02-07 DIAGNOSIS — Z86711 Personal history of pulmonary embolism: Secondary | ICD-10-CM

## 2021-02-07 DIAGNOSIS — Z86718 Personal history of other venous thrombosis and embolism: Secondary | ICD-10-CM

## 2021-02-07 DIAGNOSIS — I825Z9 Chronic embolism and thrombosis of unspecified deep veins of unspecified distal lower extremity: Secondary | ICD-10-CM

## 2021-02-07 NOTE — Assessment & Plan Note (Signed)
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 During our initial visit, I explained to him that despite the hypercoagulable work up, he will have to continue anticoagulation indefinitely. He however was interested in pursuing hypercoagulable work up. We discussed today that his anticardiolipin antibodies were  Mildly elevated and need to be repeated in 12 weeks. We discussed that if his aCL antibodies continue to be high, then he may have APLS which can increase risk of blood clots in arterial/venous territories and he will have to continue warfarin indefinitely which was any way the original recommendation. He expressed understanding of recommendations.  Labs ordered in 12 weeks Phone visit in 12 weeks.

## 2021-02-07 NOTE — Telephone Encounter (Signed)
Scheduled appointment per 01/06 staff message. Left message with appointment times.

## 2021-02-07 NOTE — Progress Notes (Signed)
[facility] NOTE  Patient Care Team: Pieter Partridge, PA as PCP - General (Family Medicine)  CHIEF COMPLAINTS/PURPOSE OF CONSULTATION:  Recurrent DVT, follow up  ASSESSMENT & PLAN:  No problem-specific Assessment & Plan notes found for this encounter.   Orders Placed This Encounter  Procedures   Cardiolipin antibodies, IgG, IgM, IgA*    Standing Status:   Future    Standing Expiration Date:   02/07/2022     HISTORY OF PRESENTING ILLNESS:   Craig Hahn 61 y.o. 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  Please refer to my initial consult note for full history  Since last visit, he says he has been compliant and taking coumadin as instructed. He didn't talk about any other concerns. I didn't obtain a full ROS today during the telephone visit.  MEDICAL HISTORY:  Past Medical History:  Diagnosis Date   Allergy    Anxiety    Clotting disorder ([place])    Unsure if has ever been worked up, per pet has had 9 clots in the past, last was in 2006, both brothers also have clotting issues   Depression    DVT (deep venous thrombosis) ([place])     SURGICAL HISTORY: Past Surgical History:  Procedure Laterality Date   dental implants     x8    SOCIAL HISTORY: Social History   Socioeconomic History   Marital status: Single    Spouse name: Not on file   Number of children: 0   Years of education: Not on file   Highest education level: Not on file  Occupational History   Occupation: Architect  Tobacco Use   Smoking status: Former    Packs/day: 1.00    Types: E-cigarettes, Cigarettes    Quit date: 01/14/2017    Years since quitting: 4.0   Smokeless tobacco: Never  Vaping Use   Vaping Use: Never used  Substance and Sexual Activity    Alcohol use: Yes    Comment: daily 4 beers    Drug use: Yes    Types: Marijuana    Comment: "medical marijuana"   Sexual activity: Not Currently    Partners: Female    Birth control/protection: Condom  Other Topics Concern   Not on file  Social History Narrative   Not on file   Social Determinants of Health   Financial Resource Strain: Not on file  Food Insecurity: Not on file  Transportation Needs: Not on file  Physical Activity: Not on file  Stress: Not on file  Social Connections: Not on file  Intimate Partner Violence: Not on file    FAMILY HISTORY: Family History  Problem Relation Age of Onset   Heart attack Father    Clotting disorder Brother    Clotting disorder Brother     ALLERGIES:  is allergic to codeine, penicillins, and sulfa antibiotics.  MEDICATIONS:  Current Outpatient Medications  Medication Sig Dispense Refill   Ascorbic Acid (VITAMIN C PO) Take 1 tablet by mouth daily.     Cyanocobalamin (VITAMIN B-12 PO) Take 1 tablet by mouth daily.     ferrous sulfate 324 MG TBEC Take 324 mg by mouth daily.     MAGNESIUM PO Take 1 tablet by mouth daily.     Multiple Vitamins-Minerals (B-REDI/RED HEARTS/RED ROOSTERS PO) Take 1 tablet by  mouth daily.     Multiple Vitamins-Minerals (ZINC PO) Take 1 tablet by mouth daily.     nitroGLYCERIN (NITROSTAT) 0.4 MG SL tablet Place 1 tablet (0.4 mg total) under the tongue every 5 (five) minutes as needed for chest pain. 90 tablet 3   Omega-3 Fatty Acids (FISH OIL PO) Take 1 capsule by mouth daily.     warfarin (COUMADIN) 5 MG tablet Take 10 mg by mouth daily.     No current facility-administered medications for this visit.   PHYSICAL EXAMINATION: ECOG PERFORMANCE STATUS: 0 - Asymptomatic  There were no vitals filed for this visit.  There were no vitals filed for this visit.  V/S and PE not done, telephone visit.  LABORATORY DATA:  I have reviewed the data as listed Lab Results  Component Value Date   WBC 5.4  01/21/2021   HGB 15.6 01/21/2021   HCT 45.3 01/21/2021   MCV 95.6 01/21/2021   PLT 196 01/21/2021     Chemistry      Component Value Date/Time   NA 138 01/21/2021 0443   K 4.0 01/21/2021 0443   CL 107 01/21/2021 0443   CO2 26 01/21/2021 0443   BUN 10 01/21/2021 0443   CREATININE 0.80 01/21/2021 0443      Component Value Date/Time   CALCIUM 8.5 (L) 01/21/2021 0443   ALKPHOS 46 01/21/2021 0443   AST 25 01/21/2021 0443   ALT 27 01/21/2021 0443   BILITOT 0.8 01/21/2021 0443     I have reviewed pertinent medical records  RADIOGRAPHIC STUDIES:  I have personally reviewed the radiological images as listed and agreed with the findings in the report. CT Head Wo Contrast  Result Date: 01/20/2021 CLINICAL DATA:  Head trauma, coagulopathy (Age 18-64y) warfarin, loc; fall, coagulopathic, loc EXAM: CT HEAD WITHOUT CONTRAST CT CERVICAL SPINE WITHOUT CONTRAST TECHNIQUE: Multidetector CT imaging of the head and cervical spine was performed following the standard protocol without intravenous contrast. Multiplanar CT image reconstructions of the cervical spine were also generated. COMPARISON:  None. FINDINGS: CT HEAD FINDINGS BRAIN: BRAIN Pprominence of the lateral ventricles may be related to central predominant atrophy, although a component of normal pressure/communicating hydrocephalus cannot be excluded. Patchy and confluent areas of decreased attenuation are noted throughout the deep and periventricular white matter of the cerebral hemispheres bilaterally, compatible with chronic microvascular ischemic disease. No evidence of large-territorial acute infarction. No parenchymal hemorrhage. Enlarged pituitary gland measuring up to 1 cm with hyperdensity associated posteriorly. No extra-axial collection. No mass effect or midline shift. No hydrocephalus. Basilar cisterns are patent. Vascular: No hyperdense vessel. Atherosclerotic calcifications are present within the cavernous internal carotid  arteries. Skull: No acute fracture or focal lesion. Sinuses/Orbits: Paranasal sinuses and mastoid air cells are clear. The orbits are unremarkable. Other: None. CT CERVICAL SPINE FINDINGS Alignment: Normal. Skull base and vertebrae: Multilevel degenerative changes of the spine. No acute fracture. No aggressive appearing focal osseous lesion or focal pathologic process. Soft tissues and spinal canal: No prevertebral fluid or swelling. No visible canal hematoma. Upper chest: Biapical pleural/pulmonary scarring. Possible mild emphysematous changes. Other: None. IMPRESSION: 1. Prominence of the lateral ventricles may be related to central predominant atrophy, although a component of normal pressure/communicating hydrocephalus cannot be excluded. 2. Enlarged pituitary gland measuring up to 1 cm. Consider nonemergent MRI sella protocol further evaluation if clinically indicated. 3. No acute intracranial abnormality. 4. No acute displaced fracture or traumatic listhesis of the cervical spine. Electronically Signed   By: Clelia Croft.D.  On: 01/20/2021 20:26   CT Angio Chest PE W and/or Wo Contrast  Result Date: 01/21/2021 CLINICAL DATA:  Pulmonary embolism (PE) suspected, high prob. Left arm weakness. EXAM: CT ANGIOGRAPHY CHEST WITH CONTRAST TECHNIQUE: Multidetector CT imaging of the chest was performed using the standard protocol during bolus administration of intravenous contrast. Multiplanar CT image reconstructions and MIPs were obtained to evaluate the vascular anatomy. CONTRAST:  25mL OMNIPAQUE IOHEXOL 350 MG/ML SOLN COMPARISON:  04/12/2020 FINDINGS: Cardiovascular: Adequate opacification of the pulmonary arterial tree. No intraluminal filling defect identified to suggest acute pulmonary embolism. Central pulmonary arteries are of normal caliber. No significant coronary artery calcification. Cardiac size within normal limits. No pericardial effusion. Mild atherosclerotic calcification within the thoracic  aorta. No aortic aneurysm. 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 Mediastinum/Nodes: No enlarged mediastinal, hilar, or axillary lymph nodes. Thyroid gland, trachea, and esophagus demonstrate no significant findings. Lungs/Pleura: Lungs are clear. No pleural effusion or pneumothorax. Upper Abdomen: No acute abnormality. Musculoskeletal: No chest wall abnormality. No acute or significant osseous findings. Review of the MIP images confirms the above findings. IMPRESSION: No pulmonary embolism.  No acute intrathoracic pathology identified. Aortic Atherosclerosis (ICD10-I70.0). Electronically Signed   By: Fidela Salisbury M.D.   On: 01/21/2021 00:45   CT Cervical Spine Wo Contrast  Result Date: 01/20/2021 CLINICAL DATA:  Head trauma, coagulopathy (Age 41-64y) warfarin, loc; fall, coagulopathic, loc EXAM: CT HEAD WITHOUT CONTRAST CT CERVICAL SPINE WITHOUT CONTRAST TECHNIQUE: Multidetector CT imaging of the head and cervical spine was performed following the standard protocol without intravenous contrast. Multiplanar CT image reconstructions of the cervical spine were also generated. COMPARISON:  None. FINDINGS: CT HEAD FINDINGS BRAIN: BRAIN Pprominence of the lateral ventricles may be related to central predominant atrophy, although a component of normal pressure/communicating hydrocephalus cannot be excluded. Patchy and confluent areas of decreased attenuation are noted throughout the deep and periventricular white matter of the cerebral hemispheres bilaterally, compatible with chronic microvascular ischemic disease. No evidence of large-territorial acute infarction. No parenchymal hemorrhage. Enlarged pituitary gland measuring up to 1 cm with hyperdensity associated posteriorly. No extra-axial collection. No mass effect or midline shift. No hydrocephalus. Basilar cisterns are patent. Vascular: No hyperdense vessel. Atherosclerotic calcifications are present within the cavernous internal carotid  arteries. Skull: No acute fracture or focal lesion. Sinuses/Orbits: Paranasal sinuses and mastoid air cells are clear. The orbits are unremarkable. Other: None. CT CERVICAL SPINE FINDINGS Alignment: Normal. Skull base and vertebrae: Multilevel degenerative changes of the spine. No acute fracture. No aggressive appearing focal osseous lesion or focal pathologic process. Soft tissues and spinal canal: No prevertebral fluid or swelling. No visible canal hematoma. Upper chest: Biapical pleural/pulmonary scarring. Possible mild emphysematous changes. Other: None. IMPRESSION: 1. Prominence of the lateral ventricles may be related to central predominant atrophy, although a component of normal pressure/communicating hydrocephalus cannot be excluded. 2. Enlarged pituitary gland measuring up to 1 cm. Consider nonemergent MRI sella protocol further evaluation if clinically indicated. 3. No acute intracranial abnormality. 4. No acute displaced fracture or traumatic listhesis of the cervical spine. Electronically Signed   By: Iven Finn M.D.   On: 01/20/2021 20:26   DG Chest Portable 1 View  Result Date: 01/20/2021 CLINICAL DATA:  Loss of consciousness. EXAM: PORTABLE CHEST 1 VIEW COMPARISON:  Chest x-ray 04/12/2020, CT chest 04/12/2020 FINDINGS: The heart and mediastinal contours are unchanged. Aortic calcification. No focal consolidation. No pulmonary edema. No pleural effusion. No pneumothorax. No acute osseous abnormality. IMPRESSION: No active disease. Electronically Signed  By: Iven Finn M.D.   On: 01/20/2021 20:13   ECHOCARDIOGRAM COMPLETE  Result Date: 01/21/2021    ECHOCARDIOGRAM REPORT   Patient Name:   Craig Hahn Date of Exam: 01/21/2021 Medical Rec #:  [id number]    Height:       73.0 in Accession #:    [id number]   Weight:       168.4 lb Date of Birth:  1961/01/31    BSA:          2.000 m Patient Age:    7 years     BP:           122/66 mmHg Patient Gender: M            HR:           47 bpm.  Exam Location:  Inpatient Procedure: 2D Echo, Cardiac Doppler, Color Doppler and 3D Echo Indications:    R55 Syncope  History:        Patient has prior history of Echocardiogram examinations, most                 recent 08/20/2020. Arrythmias:Bradycardia;                 Signs/Symptoms:Syncope.  Sonographer:    Glo Herring Referring Phys: [id number] [place]  1. Left ventricular ejection fraction, by estimation, is 55 to 60%. The left ventricle has normal function. The left ventricle has no regional wall motion abnormalities. Left ventricular diastolic parameters were normal.  2. Right ventricular systolic function is normal. The right ventricular size is normal. There is normal pulmonary artery systolic pressure. The estimated right ventricular systolic pressure is 0000000 mmHg.  3. The mitral valve is normal in structure. Trivial mitral valve regurgitation. No evidence of mitral stenosis.  4. The aortic valve is tricuspid. Aortic valve regurgitation is not visualized. No aortic stenosis is present.  5. The inferior vena cava is dilated in size with >50% respiratory variability, suggesting right atrial pressure of 8 mmHg. Comparison(s): No significant change from prior study. Conclusion(s)/Recommendation(s): Otherwise normal echocardiogram, with minor abnormalities described in the report. FINDINGS  Left Ventricle: Left ventricular ejection fraction, by estimation, is 55 to 60%. The left ventricle has normal function. The left ventricle has no regional wall motion abnormalities. The left ventricular internal cavity size was normal in size. There is  no left ventricular hypertrophy. Left ventricular diastolic parameters were normal. Right Ventricle: The right ventricular size is normal. No increase in right ventricular wall thickness. Right ventricular systolic function is normal. There is normal pulmonary artery systolic pressure. The tricuspid regurgitant velocity is 1.75 m/s, and  with an  assumed right atrial pressure of 8 mmHg, the estimated right ventricular systolic pressure is 0000000 mmHg. Left Atrium: Left atrial size was normal in size. Right Atrium: Right atrial size was normal in size. Pericardium: There is no evidence of pericardial effusion. Mitral Valve: Borderline MV bowing, no prolapse. The mitral valve is normal in structure. Trivial mitral valve regurgitation. No evidence of mitral valve stenosis. Tricuspid Valve: The tricuspid valve is normal in structure. Tricuspid valve regurgitation is trivial. No evidence of tricuspid stenosis. Aortic Valve: The aortic valve is tricuspid. Aortic valve regurgitation is not visualized. No aortic stenosis is present. Aortic valve mean gradient measures 4.0 mmHg. Aortic valve peak gradient measures 7.1 mmHg. Aortic valve area, by VTI measures 2.72 cm. Pulmonic Valve: The pulmonic valve was not well visualized. Pulmonic valve regurgitation is not visualized. Aorta:  The aortic root, ascending aorta, aortic arch and descending aorta are all structurally normal, with no evidence of dilitation or obstruction. Venous: The inferior vena cava is dilated in size with greater than 50% respiratory variability, suggesting right atrial pressure of 8 mmHg. IAS/Shunts: The atrial septum is grossly normal.  LEFT VENTRICLE PLAX 2D LVIDd:         4.85 cm   Diastology LVIDs:         3.45 cm   LV e' medial:    6.31 cm/s LV PW:         1.05 cm   LV E/e' medial:  9.9 LV IVS:        1.05 cm   LV e' lateral:   10.90 cm/s LVOT diam:     2.20 cm   LV E/e' lateral: 5.7 LV SV:         81 LV SV Index:   40 LVOT Area:     3.80 cm  RIGHT VENTRICLE             IVC RV Basal diam:  4.00 cm     IVC diam: 2.30 cm RV S prime:     11.20 cm/s LEFT ATRIUM             Index        RIGHT ATRIUM           Index LA diam:        3.15 cm 1.57 cm/m   RA Area:     16.50 cm LA Vol (A2C):   57.6 ml 28.80 ml/m  RA Volume:   42.30 ml  21.15 ml/m LA Vol (A4C):   43.2 ml 21.60 ml/m LA Biplane Vol:  51.7 ml 25.85 ml/m  AORTIC VALVE                    PULMONIC VALVE AV Area (Vmax):    2.56 cm     PV Vmax:       0.81 m/s AV Area (Vmean):   2.41 cm     PV Peak grad:  2.6 mmHg AV Area (VTI):     2.72 cm AV Vmax:           133.00 cm/s AV Vmean:          91.000 cm/s AV VTI:            0.296 m AV Peak Grad:      7.1 mmHg AV Mean Grad:      4.0 mmHg LVOT Vmax:         89.40 cm/s LVOT Vmean:        57.700 cm/s LVOT VTI:          0.212 m LVOT/AV VTI ratio: 0.72  AORTA Ao Root diam: 3.40 cm Ao Asc diam:  3.40 cm MITRAL VALVE               TRICUSPID VALVE MV Area (PHT): 3.63 cm    TR Peak grad:   12.2 mmHg MV Decel Time: 209 msec    TR Vmax:        175.00 cm/s MV E velocity: 62.60 cm/s MV A velocity: 57.80 cm/s  SHUNTS MV E/A ratio:  1.08        Systemic VTI:  0.21 m                            Systemic Diam: 2.20 cm  Buford Dresser MD Electronically signed by Buford Dresser MD Signature Date/Time: 01/21/2021/4:12:19 PM    Final     I connected with  Lacretia Leigh on 02/07/21 by a telephone application and verified that I am speaking with the correct person using two identifiers.   I discussed the limitations of evaluation and management by telemedicine. The patient expressed understanding and agreed to proceed.  I spent 15 minutes in the care of this patient, including review of records, discussion about his results and counseling    Benay Pike, MD 02/07/2021 8:22 PM

## 2021-02-28 ENCOUNTER — Other Ambulatory Visit: Payer: Self-pay

## 2021-02-28 ENCOUNTER — Other Ambulatory Visit (HOSPITAL_COMMUNITY): Payer: Self-pay | Admitting: Physician Assistant

## 2021-02-28 ENCOUNTER — Ambulatory Visit (HOSPITAL_COMMUNITY)
Admission: RE | Admit: 2021-02-28 | Discharge: 2021-02-28 | Disposition: A | Discharge status: Home / Self Care | Payer: Self-pay | Source: Ambulatory Visit | Attending: Physician Assistant | Admitting: Physician Assistant

## 2021-02-28 DIAGNOSIS — R600 Localized edema: Secondary | ICD-10-CM | POA: Insufficient documentation

## 2021-02-28 DIAGNOSIS — M7989 Other specified soft tissue disorders: Secondary | ICD-10-CM | POA: Insufficient documentation

## 2021-02-28 DIAGNOSIS — M79661 Pain in right lower leg: Secondary | ICD-10-CM

## 2021-02-28 DIAGNOSIS — M79662 Pain in left lower leg: Secondary | ICD-10-CM | POA: Insufficient documentation

## 2021-02-28 IMAGING — US US EXTREM LOW VENOUS
1 series · 13 of 24 positions shown · non-contrast
Comparison: [DATE]

CLINICAL DATA: Bilateral lower extremity pain



[Series 1: us venous img lower bilat (dvt) · portal-venous · 13 of 80 slices shown]
[im 1/80]
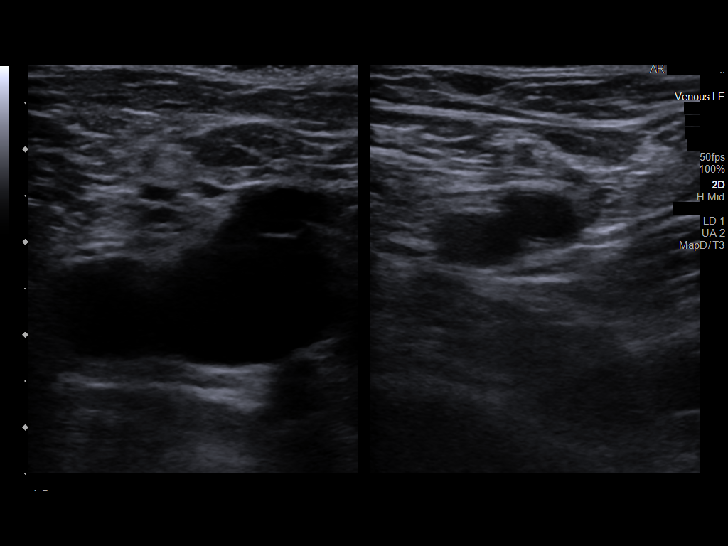
[im 7/80]
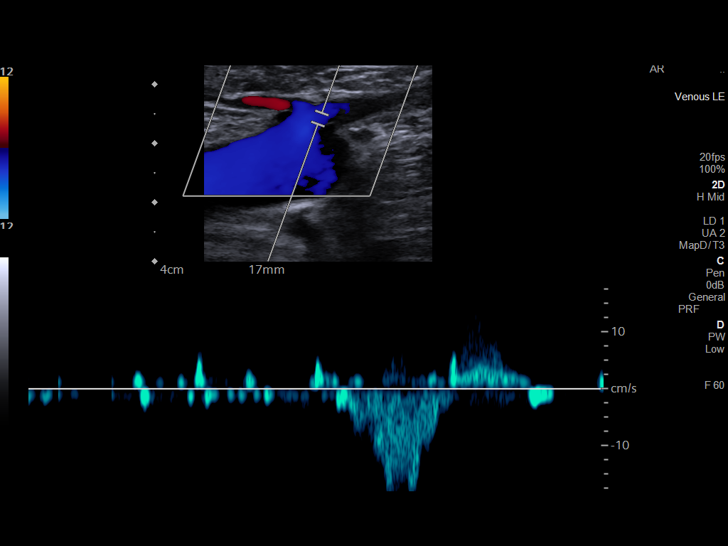
[im 14/80]
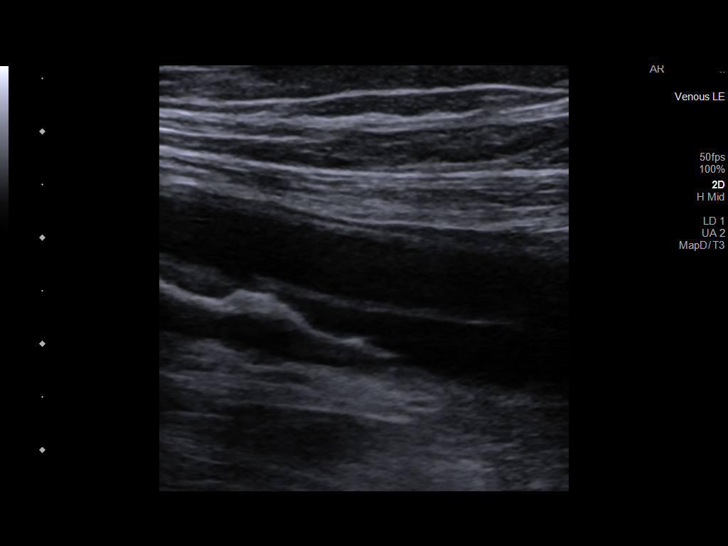
[im 21/80]
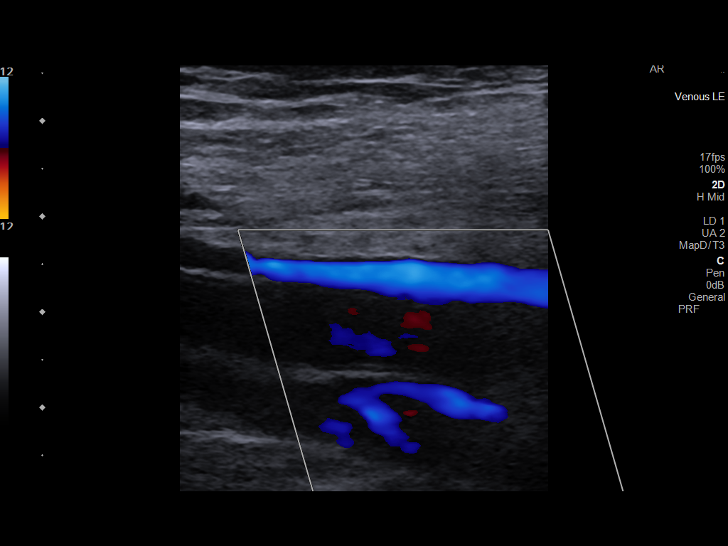
[im 28/80]
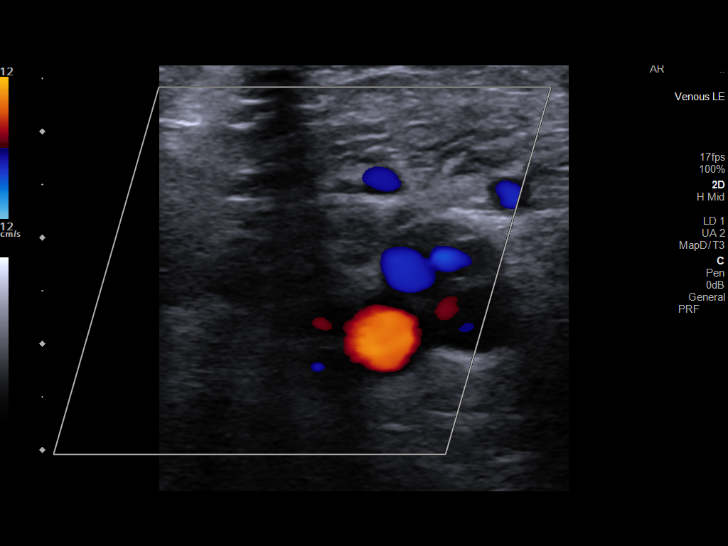
[im 35/80]
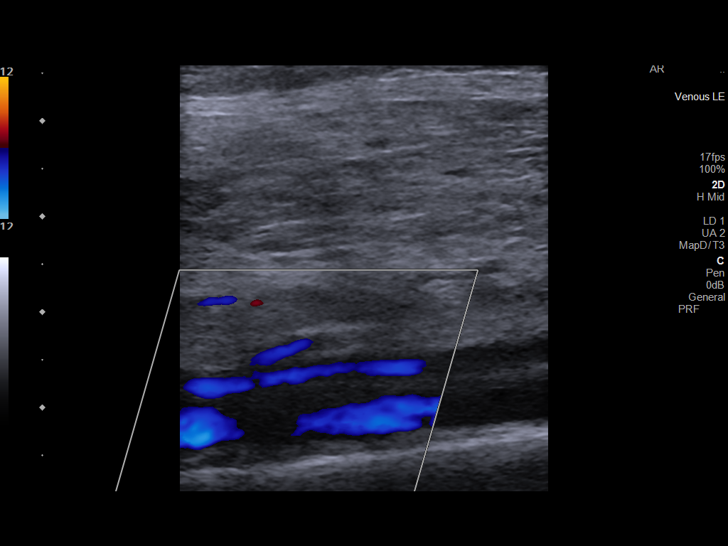
[im 42/80]
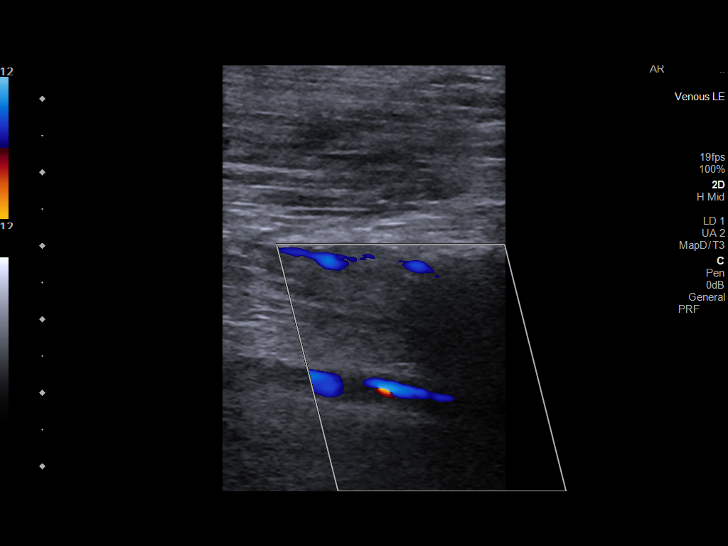
[im 45/80]
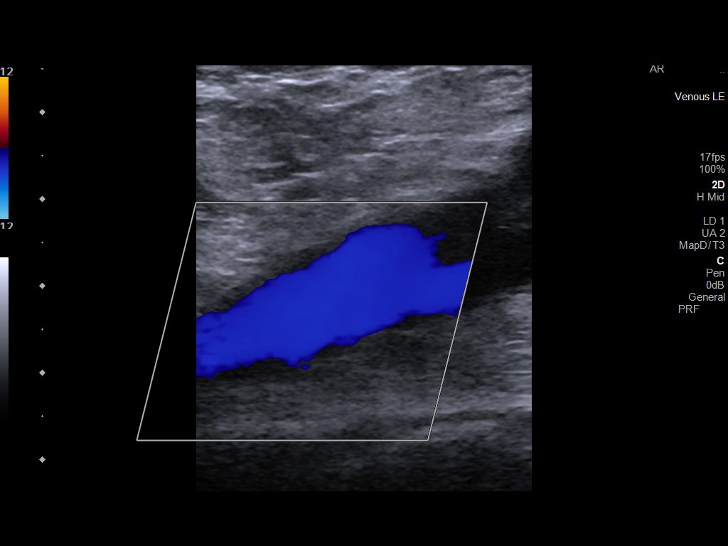
[im 52/80]
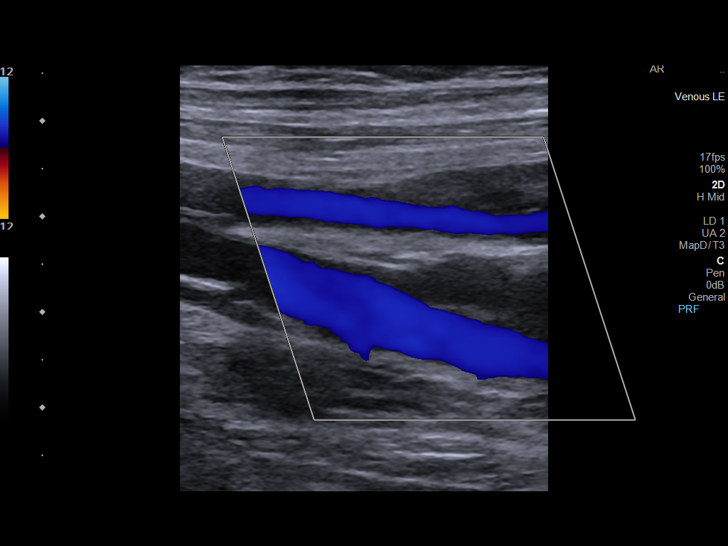
[im 59/80]
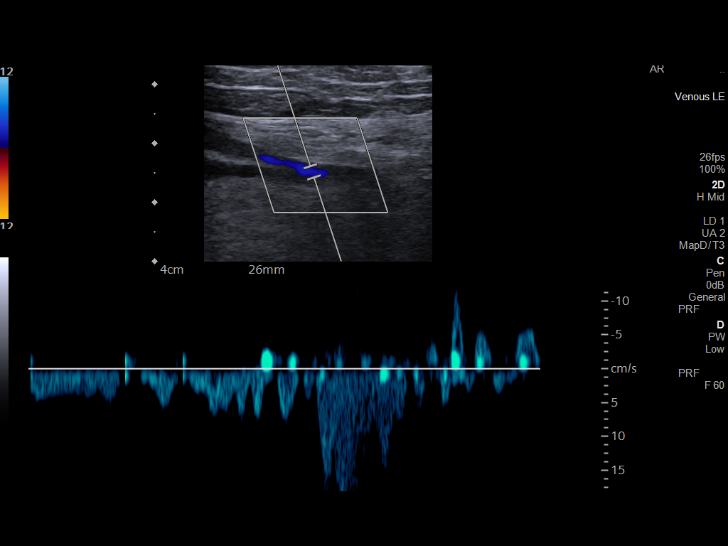
[im 66/80]
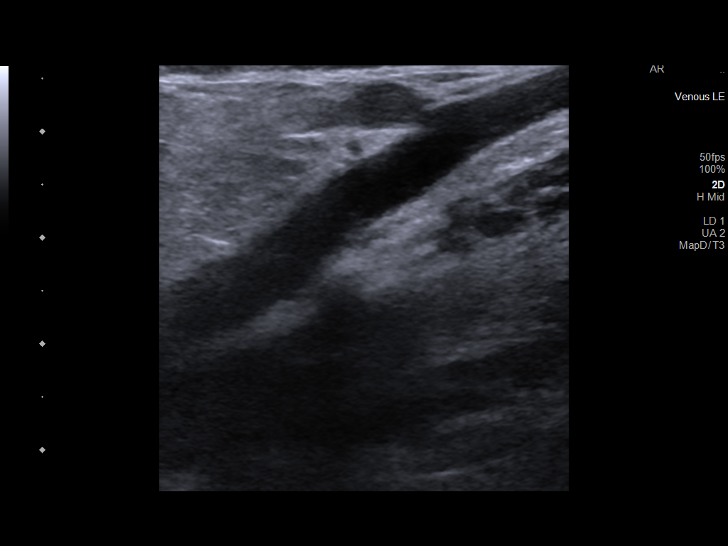
[im 73/80]
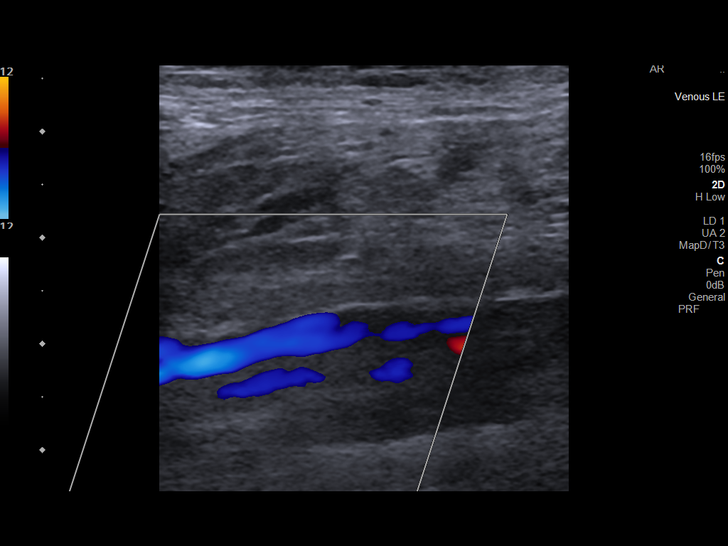
[im 80/80]
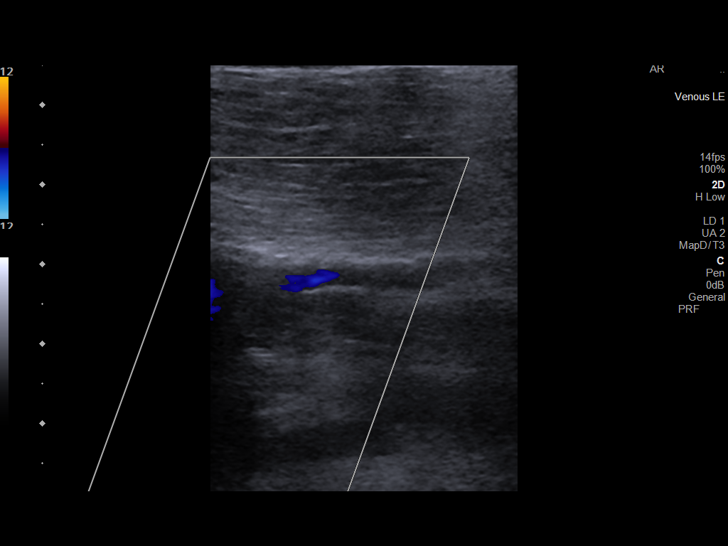

[13 of 24 positions shown; findings below may reference images not displayed]

FINDINGS: RIGHT LOWER EXTREMITY

Common Femoral Vein: No evidence of thrombus. Normal
compressibility, respiratory phasicity and response to augmentation.

Saphenofemoral Junction: No evidence of thrombus. Normal
compressibility and flow on color Doppler imaging.

Profunda Femoral Vein: No evidence of thrombus. Normal
compressibility and flow on color Doppler imaging.

Femoral Vein: Echogenic, nonocclusive thrombus within the distal
right femoral vein. Vessel is partially compressible. The proximal
to mid portion of the right femoral vein is patent.

Popliteal Vein: Echogenic nonocclusive thrombus.

Calf Veins: Echogenic nonocclusive thrombus within the posterior
tibial, peroneal, and anterior tibial veins.

Superficial Great Saphenous Vein: No evidence of thrombus. Normal
compressibility.

Venous Reflux:  None.

Other Findings:  None.

LEFT LOWER EXTREMITY

Common Femoral Vein: No evidence of thrombus. Normal
compressibility, respiratory phasicity and response to augmentation.

Saphenofemoral Junction: No evidence of thrombus. Normal
compressibility and flow on color Doppler imaging.

Profunda Femoral Vein: No evidence of thrombus. Normal
compressibility and flow on color Doppler imaging.

Femoral Vein: Echogenic nonocclusive thrombus throughout the left
femoral vein. Vessel is partially compressible.

Popliteal Vein: Echogenic nonocclusive thrombus.

Calf Veins: Echogenic nonocclusive thrombus within the posterior
tibial, peroneal, and anterior tibial veins.

Superficial Great Saphenous Vein: No evidence of thrombus. Normal
compressibility.

Venous Reflux:  None.

Other Findings:  None.
IMPRESSION: Echogenic, nonocclusive thrombus within the bilateral femoral veins
and involving the deep calf veins of both lower extremities.
Overall, findings are similar to the previous ultrasound. An acute
on chronic component would be difficult to exclude.

Report to be called by telephone by ultrasound technologist OLIMPIA.

## 2021-04-18 ENCOUNTER — Inpatient Hospital Stay: Payer: Self-pay | Attending: Hematology and Oncology

## 2021-04-25 ENCOUNTER — Inpatient Hospital Stay: Payer: Self-pay | Admitting: Hematology and Oncology

## 2021-07-31 ENCOUNTER — Encounter: Payer: Self-pay | Admitting: Cardiology

## 2021-07-31 ENCOUNTER — Ambulatory Visit: Payer: Self-pay | Admitting: Cardiology

## 2021-07-31 VITALS — BP 119/73 | HR 73 | Temp 98.0°F | Resp 16 | Ht 73.0 in | Wt 176.6 lb

## 2021-07-31 DIAGNOSIS — I200 Unstable angina: Secondary | ICD-10-CM

## 2021-07-31 DIAGNOSIS — R079 Chest pain, unspecified: Secondary | ICD-10-CM

## 2021-07-31 NOTE — Progress Notes (Signed)
Patient referred by Roderick Pee, PA for chest pain  Subjective:   Craig Hahn, male    DOB: 30-Sep-1960, 61 y.o.   MRN: [id number]   Chief Complaint  Patient presents with   Chest Pain   New Patient (Initial Visit)     HPI  61 y.o. 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  For the last few days, he has had severe left-sided chest pain radiating to his jaw and arm.  Episodes usually occur with exertion, improved with rest.  In fact, he reports ongoing pain for last 5 days.,  That has not become fairly constant.  He has several risk factors for coronary artery disease.  In addition, he has had multiple DVTs, and is currently on warfarin.  He is never seen a hematologist.  He had multiple CT chest in the past, not showing PE.  Patient does not have insurance.  He is very reluctant to go to emergency room, fearing hospitalizations and multiple tests on the cost of the same.  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  There is also been a lot of stress due to his physically demanding job.  He is reluctant to wait for noninvasive testing that could take several days.  On a separate note, he reports headaches.  He denies any loss of consciousness, stroke/TIA symptoms.   Past Medical History:  Diagnosis Date   Allergy    Anxiety    Clotting disorder (HCC)    Unsure if has ever been worked up, per pet has had 9 clots in the past, last was in 2006, both brothers also have clotting issues   Depression    DVT (deep venous thrombosis) (HCC)      Past Surgical History:  Procedure Laterality Date   dental implants     x8     Social History   Tobacco Use  Smoking Status Former   Packs/day:  1.00   Types: E-cigarettes, Cigarettes   Quit date: 01/14/2017   Years since quitting: 4.5  Smokeless Tobacco Never    Social History   Substance and Sexual Activity  Alcohol Use Yes   Comment: daily 4 beers      Family History  Problem Relation Age of Onset   Heart attack Father    Clotting disorder Brother    Clotting disorder Brother       Current Outpatient Medications:    Ascorbic Acid (VITAMIN C PO), Take 1 tablet by mouth daily., Disp: , Rfl:    Cyanocobalamin (VITAMIN B-12 PO), Take 1 tablet by mouth daily., Disp: , Rfl:    ferrous sulfate 324 MG TBEC, Take 324 mg by mouth daily., Disp: , Rfl:    MAGNESIUM PO, Take 1 tablet by mouth daily., Disp: , Rfl:    Multiple Vitamins-Minerals (B-REDI/RED HEARTS/RED ROOSTERS PO), Take 1 tablet by mouth daily., Disp: , Rfl:    Multiple Vitamins-Minerals (ZINC PO), Take 1 tablet by mouth daily., Disp: , Rfl:    nitroGLYCERIN (NITROSTAT) 0.4 MG SL tablet, Place 1 tablet (0.4 mg total) under the tongue every 5 (five) minutes as needed for chest pain., Disp: 90 tablet, Rfl: 3   Omega-3 Fatty Acids (FISH OIL PO),  Take 1 capsule by mouth daily., Disp: , Rfl:    warfarin (COUMADIN) 5 MG tablet, Take 10 mg by mouth daily., Disp: , Rfl:    Cardiovascular and other pertinent studies:  Reviewed external labs and tests, independently interpreted  EKG 07/31/2021: Sinus bradycardia 47 bpm Anteroseptal infarct -age undetermined  Venous US 02/28/2021: Echogenic, nonocclusive thrombus within the bilateral femoral veins and involving the deep calf veins of both lower extremities. Overall, findings are similar to the previous ultrasound. An acute on chronic component would be difficult to exclude.   CTA chest 01/21/2021: No pulmonary embolism.  No acute intrathoracic pathology identified. Aberrant right subclavian artery Aortic Atherosclerosis (ICD10-I70.0).   Echocardiogram 01/21/2021:  1. Left ventricular ejection fraction, by  estimation, is 55 to 60%. The  left ventricle has normal function. The left ventricle has no regional  wall motion abnormalities. Left ventricular diastolic parameters were  normal.   2. Right ventricular systolic function is normal. The right ventricular  size is normal. There is normal pulmonary artery systolic pressure. The  estimated right ventricular systolic pressure is 20.2 mmHg.   3. The mitral valve is normal in structure. Trivial mitral valve  regurgitation. No evidence of mitral stenosis.   4. The aortic valve is tricuspid. Aortic valve regurgitation is not  visualized. No aortic stenosis is present.   5. The inferior vena cava is dilated in size with >50% respiratory  variability, suggesting right atrial pressure of 8 mmHg.   Comparison(s): No significant change from prior study.   Conclusion(s)/Recommendation(s): Otherwise normal echocardiogram, with  minor abnormalities described in the report.    Recent labs: Not available   Review of Systems  Cardiovascular:  Positive for chest pain. Negative for dyspnea on exertion, leg swelling, palpitations and syncope.         Vitals:   07/31/21 1429  BP: 119/73  Pulse: 73  Resp: 16  Temp: 98 F (36.7 C)  SpO2: 98%      Body mass index is 23.3 kg/m. Filed Weights   07/31/21 1429  Weight: 176 lb 9.6 oz (80.1 kg)     Objective:   Physical Exam Vitals and nursing note reviewed.  Constitutional:      General: He is not in acute distress. Neck:     Vascular: No JVD.  Cardiovascular:     Rate and Rhythm: Normal rate and regular rhythm.     Heart sounds: Normal heart sounds. No murmur heard. Pulmonary:     Effort: Pulmonary effort is normal.     Breath sounds: Normal breath sounds. No wheezing or rales.  Musculoskeletal:     Right lower leg: No edema.     Left lower leg: No edema.        Visit diagnoses:   ICD-10-CM   1. Chest pain, unspecified type  R07.9 EKG 12-Lead    2. Unstable angina (HCC)   I20.0 CBC    Basic metabolic panel    Lipid panel    Troponin T       Orders Placed This Encounter  Procedures   CBC   Basic metabolic panel   Lipid panel   Troponin T   EKG 12-Lead    Assessment & Recommendations:    61 y.o. Caucasian male with h/o recurrent DVT, nicotine dependence, alcohol overuse referred valuation of chest pain  Chest pain: Multiple risk factors for CAD, with features concerning for unstable angina. Limitations in care, given his reluctance to go to emergency room, but also does  not want to wait for noninvasive testing given the nature of his high risk job. Resting EKG with sinus rhythm in 50s, old anteroseptal infarct, not different from previous EKGs. INR 3.5 today, while on warfarin for treatment of DVTs. He is reluctant to start much medical therapy at this time. In the given circumstances, I feel the quickest and most definitive test would be coronary angiography and possible intervention.  This can be done at the earliest on Wednesday, 08/06/2021.  I have asked him to hold warfarin on coming Saturday, Sunday, Monday, and Tuesday.  This should place his INR somewhere in the range of 1.5-2 which would be safer to perform coronary angiography on.  I also do not want INR to be much lower given his history of recurrent disease.  Patient with Lovenox possibly would also increase risk of bleeding.  We mutually decided to proceed with coronary angiogram, and possible intervention on 08/06/2021.  Risks, benefits, alternate options discussed with the patient in detail.  Aberrant right subclavian artery. Will perform coronary angiogram through left radial artery.  We will check labs today.    Headache: No neurological deficit on my exam.  Etiology unclear today.  Follow-up with PCP.  Thank you for referring the patient to [place]. Please feel free to contact with any questions.   Elder Negus, MD Pager: [phone] Office: [phone]

## 2021-08-01 ENCOUNTER — Encounter: Payer: Self-pay | Admitting: Cardiology

## 2021-08-01 LAB — LIPID PANEL
Chol/HDL Ratio: 4 ratio (ref 0.0–5.0)
Cholesterol, Total: 228 mg/dL — ABNORMAL HIGH (ref 100–199)
HDL: 57 mg/dL (ref >39)
LDL Chol Calc (NIH): 147 mg/dL — ABNORMAL HIGH (ref 0–99)
Triglycerides: 134 mg/dL (ref 0–149)
VLDL Cholesterol Cal: 24 mg/dL (ref 5–40)

## 2021-08-01 LAB — BASIC METABOLIC PANEL
BUN/Creatinine Ratio: 20 (ref 10–24)
BUN: 19 mg/dL (ref 8–27)
CO2: 22 mmol/L (ref 20–29)
Calcium: 9.1 mg/dL (ref 8.6–10.2)
Chloride: 103 mmol/L (ref 96–106)
Creatinine, Ser: 0.93 mg/dL (ref 0.76–1.27)
Glucose: 83 mg/dL (ref 70–99)
Potassium: 5 mmol/L (ref 3.5–5.2)
Sodium: 140 mmol/L (ref 134–144)
eGFR: 93 mL/min/{1.73_m2} (ref >59)

## 2021-08-01 LAB — CBC
Hematocrit: 50.6 % (ref 37.5–51.0)
Hemoglobin: 17.3 g/dL (ref 13.0–17.7)
MCH: 32.3 pg (ref 26.6–33.0)
MCHC: 34.2 g/dL (ref 31.5–35.7)
MCV: 95 fL (ref 79–97)
Platelets: 206 10*3/uL (ref 150–450)
RBC: 5.35 x10E6/uL (ref 4.14–5.80)
RDW: 13.1 % (ref 11.6–15.4)
WBC: 6.9 10*3/uL (ref 3.4–10.8)

## 2021-08-01 LAB — TROPONIN T: Troponin T (Highly Sensitive): <6 ng/L (ref 0–22)

## 2021-08-06 ENCOUNTER — Ambulatory Visit (HOSPITAL_COMMUNITY)
Admission: RE | Admit: 2021-08-06 | Discharge: 2021-08-06 | Disposition: A | Discharge status: Home / Self Care | Payer: Self-pay | Source: Ambulatory Visit | Attending: Cardiology | Admitting: Cardiology

## 2021-08-06 ENCOUNTER — Encounter (HOSPITAL_COMMUNITY)
Admission: RE | Disposition: A | Discharge status: Home / Self Care | Payer: Self-pay | Source: Ambulatory Visit | Attending: Cardiology

## 2021-08-06 DIAGNOSIS — Z7901 Long term (current) use of anticoagulants: Secondary | ICD-10-CM | POA: Insufficient documentation

## 2021-08-06 DIAGNOSIS — Z86718 Personal history of other venous thrombosis and embolism: Secondary | ICD-10-CM | POA: Insufficient documentation

## 2021-08-06 DIAGNOSIS — I200 Unstable angina: Secondary | ICD-10-CM | POA: Insufficient documentation

## 2021-08-06 DIAGNOSIS — R55 Syncope and collapse: Secondary | ICD-10-CM

## 2021-08-06 DIAGNOSIS — Z87891 Personal history of nicotine dependence: Secondary | ICD-10-CM | POA: Insufficient documentation

## 2021-08-06 HISTORY — PX: LEFT HEART CATH AND CORONARY ANGIOGRAPHY: CATH118249

## 2021-08-06 LAB — PROTIME-INR
INR: 1.2 (ref 0.8–1.2)
Prothrombin Time: 14.8 seconds (ref 11.4–15.2)

## 2021-08-06 SURGERY — LEFT HEART CATH AND CORONARY ANGIOGRAPHY
Anesthesia: LOCAL

## 2021-08-06 MED ORDER — HEPARIN SODIUM (PORCINE) 1000 UNIT/ML IJ SOLN
INTRAMUSCULAR | Status: AC
  Filled 2021-08-06: qty 10

## 2021-08-06 MED ORDER — NITROGLYCERIN 1 MG/10 ML FOR IR/CATH LAB
INTRA_ARTERIAL | Status: DC | PRN
  Administered 2021-08-06: 200 ug via INTRACORONARY
  Administered 2021-08-06: 100 ug via INTRACORONARY

## 2021-08-06 MED ORDER — VERAPAMIL HCL 2.5 MG/ML IV SOLN
INTRAVENOUS | Status: AC
  Filled 2021-08-06: qty 2

## 2021-08-06 MED ORDER — SODIUM CHLORIDE 0.9 % WEIGHT BASED INFUSION: 1.0000 mL/kg/h | INTRAVENOUS | Status: DC

## 2021-08-06 MED ORDER — VERAPAMIL HCL 2.5 MG/ML IV SOLN
INTRAVENOUS | Status: DC | PRN
  Administered 2021-08-06: 10 mL via INTRA_ARTERIAL

## 2021-08-06 MED ORDER — SODIUM CHLORIDE 0.9 % WEIGHT BASED INFUSION
3.0000 mL/kg/h | INTRAVENOUS | Status: AC
  Administered 2021-08-06: 3 mL/kg/h via INTRAVENOUS

## 2021-08-06 MED ORDER — MIDAZOLAM HCL 2 MG/2ML IJ SOLN
INTRAMUSCULAR | Status: DC | PRN
  Administered 2021-08-06: 1 mg via INTRAVENOUS

## 2021-08-06 MED ORDER — SODIUM CHLORIDE 0.9% FLUSH: 3.0000 mL | Freq: Two times a day (BID) | INTRAVENOUS | Status: DC

## 2021-08-06 MED ORDER — ASPIRIN 81 MG PO CHEW
81.0000 mg | CHEWABLE_TABLET | ORAL | Status: AC
  Administered 2021-08-06: 81 mg via ORAL
  Filled 2021-08-06: qty 1

## 2021-08-06 MED ORDER — FENTANYL CITRATE (PF) 100 MCG/2ML IJ SOLN
INTRAMUSCULAR | Status: DC | PRN
  Administered 2021-08-06: 25 ug via INTRAVENOUS

## 2021-08-06 MED ORDER — MIDAZOLAM HCL 2 MG/2ML IJ SOLN
INTRAMUSCULAR | Status: AC
  Filled 2021-08-06: qty 2

## 2021-08-06 MED ORDER — HEPARIN (PORCINE) IN NACL 1000-0.9 UT/500ML-% IV SOLN
INTRAVENOUS | Status: DC | PRN
  Administered 2021-08-06 (×2): 500 mL

## 2021-08-06 MED ORDER — IOHEXOL 350 MG/ML SOLN
INTRAVENOUS | Status: DC | PRN
  Administered 2021-08-06: 55 mL

## 2021-08-06 MED ORDER — HEPARIN (PORCINE) IN NACL 1000-0.9 UT/500ML-% IV SOLN
INTRAVENOUS | Status: AC
  Filled 2021-08-06: qty 500

## 2021-08-06 MED ORDER — FENTANYL CITRATE (PF) 100 MCG/2ML IJ SOLN
INTRAMUSCULAR | Status: AC
  Filled 2021-08-06: qty 2

## 2021-08-06 MED ORDER — ISOSORBIDE MONONITRATE ER 30 MG PO TB24: 30.0000 mg | ORAL_TABLET | Freq: Every day | ORAL | 3 refills | Status: DC

## 2021-08-06 MED ORDER — LIDOCAINE HCL (PF) 1 % IJ SOLN
INTRAMUSCULAR | Status: AC
  Filled 2021-08-06: qty 30

## 2021-08-06 MED ORDER — LIDOCAINE HCL (PF) 1 % IJ SOLN
INTRAMUSCULAR | Status: DC | PRN
  Administered 2021-08-06: 2 mL

## 2021-08-06 MED ORDER — HEPARIN SODIUM (PORCINE) 1000 UNIT/ML IJ SOLN
INTRAMUSCULAR | Status: DC | PRN
  Administered 2021-08-06: 4000 [IU] via INTRAVENOUS

## 2021-08-06 MED ORDER — NITROGLYCERIN 1 MG/10 ML FOR IR/CATH LAB
INTRA_ARTERIAL | Status: AC
  Filled 2021-08-06: qty 10

## 2021-08-06 MED ORDER — SODIUM CHLORIDE 0.9% FLUSH: 3.0000 mL | INTRAVENOUS | Status: DC | PRN

## 2021-08-06 MED ORDER — SODIUM CHLORIDE 0.9 % IV SOLN: 250.0000 mL | INTRAVENOUS | Status: DC | PRN

## 2021-08-06 SURGICAL SUPPLY — 11 items
BAND ZEPHYR COMPRESS 30 LONG (HEMOSTASIS) ×1 IMPLANT
CATH INFINITI 5 FR JL3.5 (CATHETERS) ×1 IMPLANT
CATH INFINITI JR4 5F (CATHETERS) ×1 IMPLANT
CATH OPTITORQUE TIG 4.0 5F (CATHETERS) ×1 IMPLANT
GLIDESHEATH SLEND A-KIT 6F 22G (SHEATH) ×1 IMPLANT
GUIDEWIRE INQWIRE 1.5J.035X260 (WIRE) IMPLANT
INQWIRE 1.5J .035X260CM (WIRE) ×2
KIT HEART LEFT (KITS) ×2 IMPLANT
PACK CARDIAC CATHETERIZATION (CUSTOM PROCEDURE TRAY) ×2 IMPLANT
TRANSDUCER W/STOPCOCK (MISCELLANEOUS) ×2 IMPLANT
TUBING CIL FLEX 10 FLL-RA (TUBING) ×2 IMPLANT

## 2021-08-06 NOTE — Interval H&P Note (Signed)
History and Physical Interval Note:  08/06/2021 9:22 AM  Craig Hahn  has presented today for surgery, with the diagnosis of chest pain.  The various methods of treatment have been discussed with the patient and family. After consideration of risks, benefits and other options for treatment, the patient has consented to  Procedure(s): LEFT HEART CATH AND CORONARY ANGIOGRAPHY (N/A) as a surgical intervention.  The patient's history has been reviewed, patient examined, no change in status, stable for surgery.  I have reviewed the patient's chart and labs.  Questions were answered to the patient's satisfaction.    2016 Appropriate Use Criteria for Coronary Revascularization in Patients With Acute Coronary Syndrome NSTEMI/UA Intermediate Risk (TIMI Score 3-4) NSTEMI/Unstable angina, stabilized patient at Intermediate Risk (TIMI Score 3-4) Link Here: [employer] Indication:  Revascularization by PCI or CABG of 1 or more arteries in a patient with NSTEMI or unstable angina with Stabilization after presentation Intermediate risk for clinical events A (7) Indication: 16; Score 7   Romey Cohea J Masa Lubin

## 2021-08-06 NOTE — H&P (Signed)
OV 07/31/2021 copied for documentation     Patient referred by Roderick Pee, PA for chest pain  Subjective:   Craig Hahn, male    DOB: [birth year], 61 y.o.   MRN: [id number]   Chief Complaint  Patient presents with   Chest Pain   New Patient (Initial Visit)     HPI  61 y.o. 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  For the last few days, he has had severe left-sided chest pain radiating to his jaw and arm.  Episodes usually occur with exertion, improved with rest.  In fact, he reports ongoing pain for last 5 days.,  That has not become fairly constant.  He has several risk factors for coronary artery disease.  In addition, he has had multiple DVTs, and is currently on warfarin.  He is never seen a hematologist.  He had multiple CT chest in the past, not showing PE.  Patient does not have insurance.  He is very reluctant to go to emergency room, fearing hospitalizations and multiple tests on the cost of the same.  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  There is also been a lot of stress due to his physically demanding job.  He is reluctant to wait for noninvasive testing that could take several days.  On a separate note, he reports headaches.  He denies any loss of consciousness, stroke/TIA symptoms.   Past Medical History:  Diagnosis Date   Allergy    Anxiety    Clotting disorder (HCC)    Unsure if has ever been worked up, per pet has had 9 clots in the past, last was in 2006, both brothers also have clotting issues   Depression    DVT (deep venous thrombosis) (HCC)      Past Surgical History:  Procedure Laterality Date   dental implants     x8     Social History   Tobacco  Use  Smoking Status Former   Packs/day: 1.00   Types: E-cigarettes, Cigarettes   Quit date: 01/14/2017   Years since quitting: 4.5  Smokeless Tobacco Never    Social History   Substance and Sexual Activity  Alcohol Use Yes   Comment: daily 4 beers      Family History  Problem Relation Age of Onset   Heart attack Father    Clotting disorder Brother    Clotting disorder Brother       Current Outpatient Medications:    Ascorbic Acid (VITAMIN C PO), Take 1 tablet by mouth daily., Disp: , Rfl:    Cyanocobalamin (VITAMIN B-12 PO), Take 1 tablet by mouth daily., Disp: , Rfl:    ferrous sulfate 324 MG TBEC, Take 324 mg by mouth daily., Disp: , Rfl:    MAGNESIUM PO, Take 1 tablet by mouth daily., Disp: , Rfl:    Multiple Vitamins-Minerals (B-REDI/RED HEARTS/RED ROOSTERS PO), Take 1 tablet by mouth daily., Disp: , Rfl:    Multiple Vitamins-Minerals (ZINC PO), Take 1 tablet by mouth daily., Disp: , Rfl:    nitroGLYCERIN (NITROSTAT) 0.4 MG SL tablet, Place 1 tablet (0.4 mg total) under the tongue every 5 (five) minutes as needed for chest pain., Disp: 90 tablet, Rfl: 3  Omega-3 Fatty Acids (FISH OIL PO), Take 1 capsule by mouth daily., Disp: , Rfl:    warfarin (COUMADIN) 5 MG tablet, Take 10 mg by mouth daily., Disp: , Rfl:    Cardiovascular and other pertinent studies:  Reviewed external labs and tests, independently interpreted  EKG 07/31/2021: Sinus bradycardia 47 bpm Anteroseptal infarct -age undetermined  Venous US 02/28/2021: Echogenic, nonocclusive thrombus within the bilateral femoral veins and involving the deep calf veins of both lower extremities. Overall, findings are similar to the previous ultrasound. An acute on chronic component would be difficult to exclude.   CTA chest 01/21/2021: No pulmonary embolism.  No acute intrathoracic pathology identified. Aberrant right subclavian artery Aortic Atherosclerosis (ICD10-I70.0).   Echocardiogram 01/21/2021:  1.  Left ventricular ejection fraction, by estimation, is 55 to 60%. The  left ventricle has normal function. The left ventricle has no regional  wall motion abnormalities. Left ventricular diastolic parameters were  normal.   2. Right ventricular systolic function is normal. The right ventricular  size is normal. There is normal pulmonary artery systolic pressure. The  estimated right ventricular systolic pressure is 20.2 mmHg.   3. The mitral valve is normal in structure. Trivial mitral valve  regurgitation. No evidence of mitral stenosis.   4. The aortic valve is tricuspid. Aortic valve regurgitation is not  visualized. No aortic stenosis is present.   5. The inferior vena cava is dilated in size with >50% respiratory  variability, suggesting right atrial pressure of 8 mmHg.   Comparison(s): No significant change from prior study.   Conclusion(s)/Recommendation(s): Otherwise normal echocardiogram, with  minor abnormalities described in the report.    Recent labs: Not available   Review of Systems  Cardiovascular:  Positive for chest pain. Negative for dyspnea on exertion, leg swelling, palpitations and syncope.         Vitals:   07/31/21 1429  BP: 119/73  Pulse: 73  Resp: 16  Temp: 98 F (36.7 C)  SpO2: 98%      Body mass index is 23.3 kg/m. Filed Weights   07/31/21 1429  Weight: 176 lb 9.6 oz (80.1 kg)     Objective:   Physical Exam Vitals and nursing note reviewed.  Constitutional:      General: He is not in acute distress. Neck:     Vascular: No JVD.  Cardiovascular:     Rate and Rhythm: Normal rate and regular rhythm.     Heart sounds: Normal heart sounds. No murmur heard. Pulmonary:     Effort: Pulmonary effort is normal.     Breath sounds: Normal breath sounds. No wheezing or rales.  Musculoskeletal:     Right lower leg: No edema.     Left lower leg: No edema.        Visit diagnoses:   ICD-10-CM   1. Chest pain, unspecified type  R07.9 EKG  12-Lead    2. Unstable angina (HCC)  I20.0 CBC    Basic metabolic panel    Lipid panel    Troponin T       Orders Placed This Encounter  Procedures   CBC   Basic metabolic panel   Lipid panel   Troponin T   EKG 12-Lead    Assessment & Recommendations:    61 y.o. Caucasian male with h/o recurrent DVT, nicotine dependence, alcohol overuse referred valuation of chest pain  Chest pain: Multiple risk factors for CAD, with features concerning for unstable angina. Limitations in care, given his reluctance to go  to emergency room, but also does not want to wait for noninvasive testing given the nature of his high risk job. Resting EKG with sinus rhythm in 50s, old anteroseptal infarct, not different from previous EKGs. INR 3.5 today, while on warfarin for treatment of DVTs. He is reluctant to start much medical therapy at this time. In the given circumstances, I feel the quickest and most definitive test would be coronary angiography and possible intervention.  This can be done at the earliest on Wednesday, 08/06/2021.  I have asked him to hold warfarin on coming Saturday, Sunday, Monday, and Tuesday.  This should place his INR somewhere in the range of 1.5-2 which would be safer to perform coronary angiography on.  I also do not want INR to be much lower given his history of recurrent disease.  Patient with Lovenox possibly would also increase risk of bleeding.  We mutually decided to proceed with coronary angiogram, and possible intervention on 08/06/2021.  Risks, benefits, alternate options discussed with the patient in detail.  Aberrant right subclavian artery. Will perform coronary angiogram through left radial artery.  We will check labs today.    Headache: No neurological deficit on my exam.  Etiology unclear today.  Follow-up with PCP.  Thank you for referring the patient to [place]. Please feel free to contact with any questions.   Elder Negus, MD Pager:  [phone] Office: [phone]

## 2021-08-07 ENCOUNTER — Encounter (HOSPITAL_COMMUNITY): Payer: Self-pay | Admitting: Cardiology

## 2021-08-11 ENCOUNTER — Ambulatory Visit: Payer: Self-pay | Admitting: Cardiology

## 2021-08-22 ENCOUNTER — Encounter: Payer: Self-pay | Admitting: Cardiology

## 2021-08-22 ENCOUNTER — Ambulatory Visit: Payer: Self-pay | Admitting: Cardiology

## 2021-08-22 VITALS — BP 132/75 | HR 108 | Temp 98.5°F | Resp 17 | Ht 74.0 in | Wt 176.0 lb

## 2021-08-22 DIAGNOSIS — E782 Mixed hyperlipidemia: Secondary | ICD-10-CM | POA: Insufficient documentation

## 2021-08-22 DIAGNOSIS — R072 Precordial pain: Secondary | ICD-10-CM | POA: Insufficient documentation

## 2021-08-22 MED ORDER — ISOSORBIDE MONONITRATE ER 30 MG PO TB24: 30.0000 mg | ORAL_TABLET | Freq: Every day | ORAL | 3 refills | Status: DC

## 2021-08-22 NOTE — Progress Notes (Signed)
Patient referred by No ref. provider found for chest pain  Subjective:   Craig Hahn, male    DOB: 1960/04/13, 61 y.o.   MRN: [id number]   Chief Complaint  Patient presents with   POST CATH    2-3 WEEKS     HPI  61 y.o. Caucasian male with h/o recurrent DVT, nicotine dependence, alcohol overuse referred valuation of chest pain  Coronary angiogram showed no epicardial coronary artery disease, bisoprolol suggestive of possible ankle dysfunction.  His symptoms have significantly improved after starting Imdur 30 mg daily.  He is very happy about this.  10 days ago, he had 1 episode of brief loss of consciousness while working outside on a hot day, when he lifted something 30 pounds.  He has not had any recurrence since then.  He denies any palpitation symptoms otherwise.  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  For the last few days, he has had severe left-sided chest pain radiating to his jaw and arm.  Episodes usually occur with exertion, improved with rest.  In fact, he reports ongoing pain for last 5 days.,  That has not become fairly constant.  He has several risk factors for coronary artery disease.  In addition, he has had multiple DVTs, and is currently on warfarin.  He is never seen a hematologist.  He had multiple CT chest in the past, not showing PE.  Patient does not have insurance.  He is very reluctant to go to emergency room, fearing hospitalizations and multiple tests on the cost of the same.  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  There is also been a lot of stress due to his physically demanding job.  He is reluctant to wait for noninvasive testing that could take several days.  On a separate note, he reports headaches.  He denies any loss of  consciousness, stroke/TIA symptoms.    Current Outpatient Medications:    Ascorbic Acid (VITAMIN C PO), Take 1 tablet by mouth in the morning. Chewable, Disp: , Rfl:    B Complex-C (B-COMPLEX WITH VITAMIN C) tablet, Take 1 tablet by mouth in the morning., Disp: , Rfl:    ferrous sulfate 324 MG TBEC, Take 324 mg by mouth in the morning., Disp: , Rfl:    isosorbide mononitrate (IMDUR) 30 MG 24 hr tablet, Take 1 tablet (30 mg total) by mouth daily., Disp: 30 tablet, Rfl: 3   MAGNESIUM PO, Take 1 tablet by mouth in the morning., Disp: , Rfl:    Multiple Vitamins-Minerals (B-REDI/RED HEARTS/RED ROOSTERS PO), Take 1 drop by mouth daily. Red Rooster Male Energizing ChineseTonic (Mixed in water), Disp: , Rfl:    Multiple Vitamins-Minerals (ZINC PO), Take 50 mg by mouth in the morning., Disp: , Rfl:    nitroGLYCERIN (NITROSTAT) 0.4 MG SL tablet, Place 1 tablet (0.4 mg total) under the tongue every 5 (five) minutes as needed for chest pain., Disp: 90 tablet, Rfl: 3   Omega-3 Fatty Acids (FISH OIL PO), Take 1,200 mg by mouth in the morning., Disp: , Rfl:    warfarin (COUMADIN) 10 MG tablet, Take 5-10 mg by mouth See admin instructions. Take 1 tablet (10 mg) by mouth on Saturdays in the morning & take 0.5 tablet (5 mg) by mouth on all  other days of the week., Disp: , Rfl:    warfarin (COUMADIN) 2.5 MG tablet, Take 2.5 mg by mouth See admin instructions. Take 1 tablet (2.5 mg) by mouth every morning with the exception of Saturdays., Disp: , Rfl:    Cardiovascular and other pertinent studies:  Reviewed external labs and tests, independently interpreted  Coronary angiography 08/06/2021: Right dominant circulation No angiographically evident coronary artery disease Sluggish flow improved with IC NTG   Normal LVEDP  EKG 07/31/2021: Sinus bradycardia 47 bpm Anteroseptal infarct -age undetermined  Venous US 02/28/2021: Echogenic, nonocclusive thrombus within the bilateral femoral veins and involving the  deep calf veins of both lower extremities. Overall, findings are similar to the previous ultrasound. An acute on chronic component would be difficult to exclude.   CTA chest 01/21/2021: No pulmonary embolism.  No acute intrathoracic pathology identified. Aberrant right subclavian artery Aortic Atherosclerosis (ICD10-I70.0).   Echocardiogram 01/21/2021:  1. Left ventricular ejection fraction, by estimation, is 55 to 60%. The  left ventricle has normal function. The left ventricle has no regional  wall motion abnormalities. Left ventricular diastolic parameters were  normal.   2. Right ventricular systolic function is normal. The right ventricular  size is normal. There is normal pulmonary artery systolic pressure. The  estimated right ventricular systolic pressure is 62.6 mmHg.   3. The mitral valve is normal in structure. Trivial mitral valve  regurgitation. No evidence of mitral stenosis.   4. The aortic valve is tricuspid. Aortic valve regurgitation is not  visualized. No aortic stenosis is present.   5. The inferior vena cava is dilated in size with >50% respiratory  variability, suggesting right atrial pressure of 8 mmHg.   Comparison(s): No significant change from prior study.   Conclusion(s)/Recommendation(s): Otherwise normal echocardiogram, with  minor abnormalities described in the report.    Recent labs: 07/31/2021: Glucose 83, BUN/Cr 19/0.93. EGFR 93. Na/K 140/5.0 H/H 17/50. MCV 95. Platelets 235 Chol 228, TG 134, HDL 57, LDL 147   Review of Systems  Cardiovascular:  Positive for syncope (One episode). Negative for chest pain, dyspnea on exertion, leg swelling and palpitations.         Vitals:   08/22/21 1003  BP: 132/75  Pulse: (!) 108  Resp: 17  Temp: 98.5 F (36.9 C)  SpO2: 97%      Body mass index is 22.6 kg/m. Filed Weights   08/22/21 1003  Weight: 176 lb (79.8 kg)     Objective:   Physical Exam Vitals and nursing note reviewed.   Constitutional:      General: He is not in acute distress. Neck:     Vascular: No JVD.  Cardiovascular:     Rate and Rhythm: Normal rate and regular rhythm.     Heart sounds: Normal heart sounds. No murmur heard. Pulmonary:     Effort: Pulmonary effort is normal.     Breath sounds: Normal breath sounds. No wheezing or rales.  Musculoskeletal:     Right lower leg: No edema.     Left lower leg: No edema.        Visit diagnoses:   ICD-10-CM   1. Precordial pain  R07.2     2. Mixed hyperlipidemia  E78.2         Assessment & Recommendations:    61 y.o. Caucasian male with h/o recurrent DVT, nicotine dependence, alcohol overuse referred valuation of chest pain  Chest pain: No epicardial coronary artery disease (cath 08/06/2021) Slow flow possible endothelial dysfunction. Continue  Imdur 30 mg daily.  Syncope: 1 episode, most likely related to vasovagal syncope in the setting of dehydration and heavy exertion. I have cautioned him to avoid dehydration. Number of episode, I will continue external monitor will be ideal. My suspicion for arrhythmia is not high enough to recommend loop recorder at this time.  Mixed hyperlipidemia: LDL 147. He also on statin at this time.  Discussed heart healthy diet and lifestyle.    He was to see me on as-needed basis.    Nigel Mormon, MD Pager: [phone] Office: [phone]

## 2021-12-15 ENCOUNTER — Other Ambulatory Visit: Payer: Self-pay | Admitting: Cardiology

## 2022-04-06 ENCOUNTER — Other Ambulatory Visit: Payer: Self-pay

## 2022-04-06 ENCOUNTER — Emergency Department (HOSPITAL_COMMUNITY)
Admission: EM | Admit: 2022-04-06 | Discharge: 2022-04-06 | Disposition: A | Discharge status: Home / Self Care | Payer: Self-pay | Attending: Emergency Medicine | Admitting: Emergency Medicine

## 2022-04-06 ENCOUNTER — Telehealth: Payer: Self-pay

## 2022-04-06 ENCOUNTER — Emergency Department (HOSPITAL_COMMUNITY): Payer: Self-pay

## 2022-04-06 ENCOUNTER — Encounter (HOSPITAL_COMMUNITY): Payer: Self-pay

## 2022-04-06 DIAGNOSIS — R079 Chest pain, unspecified: Secondary | ICD-10-CM | POA: Insufficient documentation

## 2022-04-06 DIAGNOSIS — M79622 Pain in left upper arm: Secondary | ICD-10-CM | POA: Insufficient documentation

## 2022-04-06 DIAGNOSIS — Z7901 Long term (current) use of anticoagulants: Secondary | ICD-10-CM | POA: Insufficient documentation

## 2022-04-06 DIAGNOSIS — R0789 Other chest pain: Secondary | ICD-10-CM

## 2022-04-06 LAB — BASIC METABOLIC PANEL
Anion gap: 5 (ref 5–15)
BUN: 20 mg/dL (ref 8–23)
CO2: 24 mmol/L (ref 22–32)
Calcium: 8.3 mg/dL — ABNORMAL LOW (ref 8.9–10.3)
Chloride: 108 mmol/L (ref 98–111)
Creatinine, Ser: 0.84 mg/dL (ref 0.61–1.24)
GFR, Estimated: >60 mL/min (ref >60)
Glucose, Bld: 107 mg/dL — ABNORMAL HIGH (ref 70–99)
Potassium: 3.8 mmol/L (ref 3.5–5.1)
Sodium: 137 mmol/L (ref 135–145)

## 2022-04-06 LAB — CBC
HCT: 43.4 % (ref 39.0–52.0)
Hemoglobin: 14.7 g/dL (ref 13.0–17.0)
MCH: 32.5 pg (ref 26.0–34.0)
MCHC: 33.9 g/dL (ref 30.0–36.0)
MCV: 95.8 fL (ref 80.0–100.0)
Platelets: 178 10*3/uL (ref 150–400)
RBC: 4.53 MIL/uL (ref 4.22–5.81)
RDW: 14.2 % (ref 11.5–15.5)
WBC: 5.8 10*3/uL (ref 4.0–10.5)
nRBC: 0 % (ref 0.0–0.2)

## 2022-04-06 LAB — TROPONIN I (HIGH SENSITIVITY)
Troponin I (High Sensitivity): 5 ng/L (ref <18)
Troponin I (High Sensitivity): 4 ng/L (ref <18)

## 2022-04-06 MED ORDER — WARFARIN SODIUM 7.5 MG PO TABS: 7.5000 mg | ORAL_TABLET | Freq: Every day | ORAL | 1 refills | Status: AC

## 2022-04-06 MED ORDER — IOHEXOL 350 MG/ML SOLN
80.0000 mL | Freq: Once | INTRAVENOUS | Status: AC | PRN
  Administered 2022-04-06: 80 mL via INTRAVENOUS

## 2022-04-06 NOTE — Telephone Encounter (Signed)
Patient complains of chest and left side neck and under arm pain and also pain in his chin. Patient stated that he feels like he has "brain fog" and can think straight. He also feels lightheaded. He stated that his PCP is denying refills on his blood thinners and has been out for some weeks. He wants to know, should he need to make an appointment with you or should he go to he ED.

## 2022-04-06 NOTE — ED Triage Notes (Addendum)
Patient has had left sided chest pain for 1 month but the past 2 days it has gotten worse. Pain radiates to left neck and left arm. His father died of a heart attack at 63 years old. No nausea or vomiting. Numerous blood clots.

## 2022-04-06 NOTE — ED Provider Notes (Signed)
[place] EMERGENCY DEPARTMENT AT [facility] Provider Note   CSN: [id number] Arrival date & time: 04/06/22  1356     History  Chief Complaint  Patient presents with   Chest Pain    Craig Hahn is a 62 y.o. male.   Pt is a 62y/o male with h/o of recurrent DVT on coumadin but has been out for a month, nicotine dependence, alcohol overuse, recurrent chest pain on imdur with neg cath on 7/23 who is presenting today with complaint of chest pain.  Patient reports for this last week intermittently he has had pain in the left chest that goes into his left jaw on his left arm.  Taking a deep breath does not make it worse however moving his left arm does cause more pain.  He has not taken anything for this pain.  He reports the pain can come and go but is not associated with exertion.  He also reports that he has been off his Coumadin for the last month because he has not followed up and they have not given him a new prescription.  Prior to that he had been on it for years.  He has no prior history of PE but chronic DVTs and bilateral femoral veins based on prior evaluation.  Patient did have a negative catheterization last year and had been taking Imdur and reports he felt like he was fine but then this pain it started.  He has had a mild cough but denies fever.  He also reports this week he has had some nausea and loose stools.  He denies any known sick contacts.  He stopped smoking 2 weeks ago.  He spoke with his cardiologist today and they recommended he come here for further care.  The history is provided by the patient.  Chest Pain      Home Medications Prior to Admission medications   Medication Sig Start Date End Date Taking? Authorizing Provider  Ascorbic Acid (VITAMIN C PO) Take 1 tablet by mouth in the morning. Chewable    [provider]  B Complex-C (B-COMPLEX WITH VITAMIN C) tablet Take 1 tablet by mouth in the morning.    [provider]  ferrous  sulfate 324 MG TBEC Take 324 mg by mouth in the morning.    [provider]  isosorbide mononitrate (IMDUR) 30 MG 24 hr tablet TAKE 1 TABLET(30 MG) BY MOUTH DAILY 12/15/21   Patwardhan, Manish J, MD  MAGNESIUM PO Take 1 tablet by mouth in the morning.    [provider]  Multiple Vitamins-Minerals (B-REDI/RED HEARTS/RED ROOSTERS PO) Take 1 drop by mouth daily. Red Rooster Male Energizing ChineseTonic (Mixed in water)    [provider]  Multiple Vitamins-Minerals (ZINC PO) Take 50 mg by mouth in the morning.    [provider]  nitroGLYCERIN (NITROSTAT) 0.4 MG SL tablet Place 1 tablet (0.4 mg total) under the tongue every 5 (five) minutes as needed for chest pain. 08/26/20 08/22/21  Verta Ellen., NP  Omega-3 Fatty Acids (FISH OIL PO) Take 1,200 mg by mouth in the morning.    [provider]  warfarin (COUMADIN) 10 MG tablet Take 5-10 mg by mouth See admin instructions. Take 1 tablet (10 mg) by mouth on Saturdays in the morning & take 0.5 tablet (5 mg) by mouth on all other days of the week. 12/27/20   [provider]  warfarin (COUMADIN) 2.5 MG tablet Take 2.5 mg by mouth See admin  instructions. Take 1 tablet (2.5 mg) by mouth every morning with the exception of Saturdays.    [provider]      Allergies    Codeine, Penicillins, and Sulfa antibiotics    Review of Systems   Review of Systems  Cardiovascular:  Positive for chest pain.    Physical Exam Updated Vital Signs BP 137/77 (BP Location: Left Arm)   Pulse 60   Temp 98.1 F (36.7 C) (Oral)   Resp (!) 21   Ht '6\' 1"'$  (1.854 m)   Wt 74.8 kg   SpO2 97%   BMI 21.77 kg/m  Physical Exam Vitals and nursing note reviewed.  Constitutional:      General: He is not in acute distress.    Appearance: He is well-developed.  HENT:     Head: Normocephalic and atraumatic.  Eyes:     Conjunctiva/sclera: Conjunctivae normal.     Pupils: Pupils are equal, round, and reactive  to light.  Cardiovascular:     Rate and Rhythm: Normal rate and regular rhythm.     Pulses: Normal pulses.     Heart sounds: No murmur heard. Pulmonary:     Effort: Pulmonary effort is normal. No respiratory distress.     Breath sounds: Normal breath sounds. No wheezing or rales.  Chest:     Chest wall: No tenderness.  Abdominal:     General: There is no distension.     Palpations: Abdomen is soft.     Tenderness: There is no abdominal tenderness. There is no guarding or rebound.  Musculoskeletal:        General: No tenderness. Normal range of motion.     Cervical back: Normal range of motion and neck supple.     Right lower leg: No edema.     Left lower leg: No edema.     Comments: Tenderness with palpation of the left axilla and with movement of the arm.  Pulses are 2+ in bilateral upper extremities and the radius.  Skin:    General: Skin is warm and dry.     Findings: No erythema or rash.  Neurological:     Mental Status: He is alert and oriented to person, place, and time. Mental status is at baseline.  Psychiatric:        Mood and Affect: Mood normal.        Behavior: Behavior normal.     ED Results / Procedures / Treatments   Labs (all labs ordered are listed, but only abnormal results are displayed) Labs Reviewed  BASIC METABOLIC PANEL - Abnormal; Notable for the following components:      Result Value   Glucose, Bld 107 (*)    Calcium 8.3 (*)    All other components within normal limits  CBC  TROPONIN I (HIGH SENSITIVITY)  TROPONIN I (HIGH SENSITIVITY)    EKG EKG Interpretation  Date/Time:  Monday April 06 2022 14:32:26 EST Ventricular Rate:  63 PR Interval:  185 QRS Duration: 85 QT Interval:  408 QTC Calculation: 418 R Axis:   -1 Text Interpretation: Sinus rhythm Anterior infarct, old No significant change since last tracing Confirmed by Blanchie Dessert [phone]) on 04/06/2022 2:51:19 PM  Radiology DG Chest 2 View  Result Date: 04/06/2022 CLINICAL  DATA:  Chest pain EXAM: CHEST - 2 VIEW COMPARISON:  01/20/2021 FINDINGS: The heart size and mediastinal contours are within normal limits. Both lungs are clear. The visualized skeletal structures are unremarkable. IMPRESSION: No active cardiopulmonary disease. Electronically Signed  By: Eugenie Filler M.D.   On: 04/06/2022 14:57    Procedures Procedures    Medications Ordered in ED Medications - No data to display  ED Course/ Medical Decision Making/ A&P                             Medical Decision Making Amount and/or Complexity of Data Reviewed Labs: ordered. Radiology: ordered.   Pt with multiple medical problems and comorbidities and presenting today with a complaint that caries a high risk for morbidity and mortality.  Patient here today with chest pain.  Patient has had prior history of chest pain and has had a neg heart cath last year but also has a significant history of DVTs and has been out of his anticoagulation for over a month.  Concern for PE today given this history.  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  He does report a mild cough but breath sounds are clear and he does not have a history of COPD.  I independently interpreted patient's EKG and labs.  EKG without acute findings today except for arm lead reversal and when repeated it was within normal limits.  CBC, BMP and troponin are all within normal limits.  Patient's initial troponin was 5 and given he has been having symptoms all week low suspicion for ACS in the setting of having a normal cath as well.  Will do a CTA to further evaluate for PE.         Final Clinical Impression(s) / ED Diagnoses Final diagnoses:  None    Rx / DC Orders ED Discharge Orders     None         Blanchie Dessert, MD 04/06/22 1724

## 2022-04-06 NOTE — Discharge Instructions (Signed)
Follow-up with your cardiologist as planned

## 2022-04-07 ENCOUNTER — Ambulatory Visit: Payer: Self-pay | Admitting: Cardiology

## 2022-12-30 ENCOUNTER — Telehealth: Payer: Self-pay | Admitting: Cardiology

## 2022-12-30 ENCOUNTER — Other Ambulatory Visit: Payer: Self-pay

## 2022-12-30 MED ORDER — ISOSORBIDE MONONITRATE ER 30 MG PO TB24: 30.0000 mg | ORAL_TABLET | Freq: Every day | ORAL | 0 refills | Status: DC

## 2022-12-30 NOTE — Telephone Encounter (Signed)
*  STAT* If patient is at the pharmacy, call can be transferred to refill team.   1. Which medications need to be refilled? (please list name of each medication and dose if known)   isosorbide mononitrate (IMDUR) 30 MG 24 hr tablet   2. Would you like to learn more about the convenience, safety, & potential cost savings by using the [facility]?   3. Are you open to using the [facility] (Type [facility]. ).  4. Which pharmacy/location (including street and city if local pharmacy) is medication to be sent to?  [id number] - [place], [place] - [address]   5. Do they need a 30 day or 90 day supply?   90 day   Patient stated he is completely out of this medication.

## 2022-12-30 NOTE — Telephone Encounter (Signed)
RX sent to requested Pharmacy

## 2023-01-26 ENCOUNTER — Other Ambulatory Visit: Payer: Self-pay | Admitting: Cardiology

## 2023-12-28 ENCOUNTER — Emergency Department (HOSPITAL_COMMUNITY)
Admission: EM | Admit: 2023-12-28 | Discharge: 2023-12-29 | Disposition: A | Discharge status: Home / Self Care | Attending: Emergency Medicine | Admitting: Emergency Medicine

## 2023-12-28 ENCOUNTER — Other Ambulatory Visit: Payer: Self-pay

## 2023-12-28 ENCOUNTER — Emergency Department (HOSPITAL_COMMUNITY)

## 2023-12-28 ENCOUNTER — Encounter (HOSPITAL_COMMUNITY): Payer: Self-pay

## 2023-12-28 DIAGNOSIS — W010XXA Fall on same level from slipping, tripping and stumbling without subsequent striking against object, initial encounter: Secondary | ICD-10-CM | POA: Diagnosis not present

## 2023-12-28 DIAGNOSIS — W19XXXA Unspecified fall, initial encounter: Secondary | ICD-10-CM

## 2023-12-28 DIAGNOSIS — I10 Essential (primary) hypertension: Secondary | ICD-10-CM | POA: Diagnosis not present

## 2023-12-28 DIAGNOSIS — S0181XA Laceration without foreign body of other part of head, initial encounter: Secondary | ICD-10-CM | POA: Insufficient documentation

## 2023-12-28 DIAGNOSIS — G9389 Other specified disorders of brain: Secondary | ICD-10-CM | POA: Diagnosis not present

## 2023-12-28 DIAGNOSIS — Z7901 Long term (current) use of anticoagulants: Secondary | ICD-10-CM | POA: Insufficient documentation

## 2023-12-28 DIAGNOSIS — S0990XA Unspecified injury of head, initial encounter: Secondary | ICD-10-CM | POA: Diagnosis not present

## 2023-12-28 MED ORDER — LIDOCAINE-EPINEPHRINE (PF) 2 %-1:200000 IJ SOLN
10.0000 mL | Freq: Once | INTRAMUSCULAR | Status: AC
  Administered 2023-12-28: 10 mL via INTRADERMAL
  Filled 2023-12-28: qty 20

## 2023-12-28 NOTE — ED Provider Notes (Signed)
 [place] EMERGENCY DEPARTMENT AT [facility] Provider Note   CSN: [id number] Arrival date & time: 12/28/23  2315     Patient presents with: No chief complaint on file.   Craig Hahn is a 63 y.o. male.  {Add pertinent medical, surgical, social history, OB history to HPI:32947} 63 yo M with a cc of a fall.  Patient tripped when his pants fell down.  Stuck his forehead.  Put superglue on the wounds, was having some persistent bleeding and so ended up calling EMS.  He is not sure why he is on Coumadin .  He thinks likely his level will be low.  Thinks his tetanus is up-to-date.  He denies any other injury.  Denies neck pain chest pain abdominal pain extremity pain.  Back pain.        Prior to Admission medications   Medication Sig Start Date End Date Taking? Authorizing Provider  Ascorbic Acid (VITAMIN C PO) Take 1 tablet by mouth in the morning. Chewable    [provider]  B Complex-C (B-COMPLEX WITH VITAMIN C) tablet Take 1 tablet by mouth in the morning.    [provider]  ferrous sulfate 324 MG TBEC Take 324 mg by mouth in the morning.    [provider]  isosorbide  mononitrate (IMDUR ) 30 MG 24 hr tablet TAKE 1 TABLET BY MOUTH EVERY DAY 01/26/23   Patwardhan, Manish J, MD  MAGNESIUM PO Take 1 tablet by mouth in the morning.    [provider]  Multiple Vitamins-Minerals (B-REDI/RED HEARTS/RED ROOSTERS PO) Take 1 drop by mouth daily. Red Rooster Male Energizing ChineseTonic (Mixed in water)    [provider]  Multiple Vitamins-Minerals (ZINC PO) Take 50 mg by mouth in the morning.    [provider]  nitroGLYCERIN  (NITROSTAT ) 0.4 MG SL tablet Place 1 tablet (0.4 mg total) under the tongue every 5 (five) minutes as needed for chest pain. 08/26/20 08/22/21  Richarda Prentice LITTIE Mickey., NP  Omega-3 Fatty Acids (FISH OIL PO) Take 1,200 mg by mouth in the morning.    [provider]  warfarin (COUMADIN ) 7.5 MG tablet  Take 1 tablet (7.5 mg total) by mouth daily. 04/06/22   Suzette Pac, MD    Allergies: Codeine, Penicillins, and Sulfa antibiotics    Review of Systems  Updated Vital Signs BP (!) 136/95   Pulse 87   Temp (!) 97.5 F (36.4 C)   Resp 17   SpO2 97%   Physical Exam Vitals and nursing note reviewed.  Constitutional:      Appearance: He is well-developed.  HENT:     Head: Normocephalic.     Comments: Patient has glue about his forehead.  Looks like he has a laceration about the middle of the forehead.  Bleeding controlled. Eyes:     Pupils: Pupils are equal, round, and reactive to light.  Neck:     Vascular: No JVD.  Cardiovascular:     Rate and Rhythm: Normal rate and regular rhythm.     Heart sounds: No murmur heard.    No friction rub. No gallop.  Pulmonary:     Effort: No respiratory distress.     Breath sounds: No wheezing.  Abdominal:     General: There is no distension.     Tenderness: There is no abdominal tenderness. There is no guarding or rebound.  Musculoskeletal:        General: Normal range of motion.     Cervical back: Normal  range of motion and neck supple.     Comments: Palpated from head to toe without any obvious noted areas of bony tenderness.  Skin:    Coloration: Skin is not pale.     Findings: No rash.  Neurological:     Mental Status: He is alert and oriented to person, place, and time.  Psychiatric:        Behavior: Behavior normal.     (all labs ordered are listed, but only abnormal results are displayed) Labs Reviewed - No data to display  EKG: None  Radiology: No results found.  {Document cardiac monitor, telemetry assessment procedure when appropriate:32947} Procedures   Medications Ordered in the ED  lidocaine -EPINEPHrine  (XYLOCAINE  W/EPI) 2 %-1:200000 (PF) injection 10 mL (has no administration in time range)      {Click here for ABCD2, HEART and other calculators REFRESH Note before signing:1}                               Medical Decision Making Amount and/or Complexity of Data Reviewed Radiology: ordered.  Risk Prescription drug management.   63 yo M with a chief complaints of fall.  Nonsyncopal by history complaining of a laceration to his forehead.  On Coumadin .  Will obtain a CT of the head.  Old records reviewed patient's tetanus last updated in 2018.  Will attempt to perform wound management though significant amount of superglue on the overlying skin may be difficult.  {Document critical care time when appropriate  Document review of labs and clinical decision tools ie CHADS2VASC2, etc  Document your independent review of radiology images and any outside records  Document your discussion with family members, caretakers and with consultants  Document social determinants of health affecting pt's care  Document your decision making why or why not admission, treatments were needed:32947:::1}   Final diagnoses:  None    ED Discharge Orders     None

## 2023-12-28 NOTE — ED Notes (Signed)
 Pt to CT

## 2023-12-28 NOTE — ED Triage Notes (Signed)
 Pt bibgcems from home , pt stated he drank a few beers and fell over his pants. Pt hit head in center of forehead with a 2 inch laceration. Pt super glued laceration before ems arrival. Wound now leaking blood so he called ems. No loc pt on warfarin.   Bp 170/100 Hr 70 Rr 16 [id number]  Cbg 92

## 2023-12-29 LAB — CBC WITH DIFFERENTIAL/PLATELET
Abs Immature Granulocytes: 0.02 K/uL (ref 0.00–0.07)
Basophils Absolute: 0.1 K/uL (ref 0.0–0.1)
Basophils Relative: 1 %
Eosinophils Absolute: 0.3 K/uL (ref 0.0–0.5)
Eosinophils Relative: 3 %
HCT: 42.8 % (ref 39.0–52.0)
Hemoglobin: 14.4 g/dL (ref 13.0–17.0)
Immature Granulocytes: 0 %
Lymphocytes Relative: 18 %
Lymphs Abs: 1.5 K/uL (ref 0.7–4.0)
MCH: 32.1 pg (ref 26.0–34.0)
MCHC: 33.6 g/dL (ref 30.0–36.0)
MCV: 95.5 fL (ref 80.0–100.0)
Monocytes Absolute: 0.8 K/uL (ref 0.1–1.0)
Monocytes Relative: 9 %
Neutro Abs: 5.5 K/uL (ref 1.7–7.7)
Neutrophils Relative %: 69 %
Platelets: 196 K/uL (ref 150–400)
RBC: 4.48 MIL/uL (ref 4.22–5.81)
RDW: 13.4 % (ref 11.5–15.5)
WBC: 8 K/uL (ref 4.0–10.5)
nRBC: 0 % (ref 0.0–0.2)

## 2023-12-29 LAB — BASIC METABOLIC PANEL WITH GFR
Anion gap: 9 (ref 5–15)
BUN: 16 mg/dL (ref 8–23)
CO2: 23 mmol/L (ref 22–32)
Calcium: 8.6 mg/dL — ABNORMAL LOW (ref 8.9–10.3)
Chloride: 105 mmol/L (ref 98–111)
Creatinine, Ser: 0.75 mg/dL (ref 0.61–1.24)
GFR, Estimated: >60 mL/min (ref >60)
Glucose, Bld: 91 mg/dL (ref 70–99)
Potassium: 4 mmol/L (ref 3.5–5.1)
Sodium: 137 mmol/L (ref 135–145)

## 2023-12-29 LAB — PROTIME-INR
INR: 1.3 — ABNORMAL HIGH (ref 0.8–1.2)
Prothrombin Time: 17.3 s — ABNORMAL HIGH (ref 11.4–15.2)

## 2023-12-29 NOTE — ED Notes (Signed)
 Pt provided with discharge and follow up instructions,  pt verbalized understanding. LDA removed, VSS, pt ambulatory out of ED w/ all paperwork and belongings in NAD.

## 2023-12-29 NOTE — Discharge Instructions (Signed)
Return for redness drainage or if you get a fever.  The sutures that were used are dissolvable that should dissolve between day 3 and day 5.  If they are still there after day 7 then you can gently plucked them out with tweezers.  The area can get wet but not fully immersed underwater.  No scrubbing.  If you really want to clean it you can apply a half-and-half hydrogen peroxide solution with water on a Q-tip.  You can apply an ointment a couple times a day this could be as simple as Vaseline but could also be an antibiotic ointment if you wish.  Once it is healed please try to avoid prolonged sun exposure use sunscreen.  Gells that have silicone antigens have been shown to reduce scarring in some research.

## 2024-01-20 DIAGNOSIS — Z125 Encounter for screening for malignant neoplasm of prostate: Secondary | ICD-10-CM | POA: Diagnosis not present

## 2024-01-20 DIAGNOSIS — I2699 Other pulmonary embolism without acute cor pulmonale: Secondary | ICD-10-CM | POA: Diagnosis not present

## 2024-01-20 DIAGNOSIS — Z950 Presence of cardiac pacemaker: Secondary | ICD-10-CM | POA: Diagnosis not present

## 2024-01-20 DIAGNOSIS — E785 Hyperlipidemia, unspecified: Secondary | ICD-10-CM | POA: Diagnosis not present

## 2024-01-20 DIAGNOSIS — Z1211 Encounter for screening for malignant neoplasm of colon: Secondary | ICD-10-CM | POA: Diagnosis not present

## 2024-01-20 DIAGNOSIS — I671 Cerebral aneurysm, nonruptured: Secondary | ICD-10-CM | POA: Diagnosis not present

## 2024-01-20 DIAGNOSIS — D497 Neoplasm of unspecified behavior of endocrine glands and other parts of nervous system: Secondary | ICD-10-CM | POA: Diagnosis not present

## 2024-01-20 DIAGNOSIS — L309 Dermatitis, unspecified: Secondary | ICD-10-CM | POA: Diagnosis not present

## 2024-01-20 DIAGNOSIS — H539 Unspecified visual disturbance: Secondary | ICD-10-CM | POA: Diagnosis not present

## 2024-01-20 DIAGNOSIS — Z86718 Personal history of other venous thrombosis and embolism: Secondary | ICD-10-CM | POA: Diagnosis not present
